# Patient Record
Sex: Female | Born: 1940
Health system: Southern US, Community
[De-identification: ages and names within clinical notes are randomized; demographics above are authoritative.]

## PROBLEM LIST (undated history)

## (undated) DIAGNOSIS — K623 Rectal prolapse: Secondary | ICD-10-CM

## (undated) DIAGNOSIS — H269 Unspecified cataract: Secondary | ICD-10-CM

## (undated) DIAGNOSIS — E785 Hyperlipidemia, unspecified: Secondary | ICD-10-CM

## (undated) DIAGNOSIS — M4802 Spinal stenosis, cervical region: Secondary | ICD-10-CM

## (undated) DIAGNOSIS — Z8742 Personal history of other diseases of the female genital tract: Secondary | ICD-10-CM

## (undated) DIAGNOSIS — IMO0002 Reserved for concepts with insufficient information to code with codable children: Secondary | ICD-10-CM

## (undated) DIAGNOSIS — Z9889 Other specified postprocedural states: Secondary | ICD-10-CM

## (undated) DIAGNOSIS — C541 Malignant neoplasm of endometrium: Secondary | ICD-10-CM

## (undated) HISTORY — DX: Hyperlipidemia, unspecified: E78.5

## (undated) HISTORY — PX: TONSILECTOMY, ADENOIDECTOMY, BILATERAL MYRINGOTOMY AND TUBES: SHX2538

## (undated) HISTORY — DX: Rectal prolapse: K62.3

## (undated) HISTORY — PX: SKIN CANCER EXCISION: SHX779

## (undated) HISTORY — PX: DILATION AND CURETTAGE OF UTERUS: SHX78

## (undated) HISTORY — DX: Malignant neoplasm of endometrium: C54.1

## (undated) HISTORY — PX: TONSILLECTOMY: SUR1361

## (undated) HISTORY — DX: Reserved for concepts with insufficient information to code with codable children: IMO0002

## (undated) HISTORY — PX: NASAL SINUS SURGERY: SHX719

## (undated) HISTORY — DX: Personal history of other diseases of the female genital tract: Z87.42

## (undated) HISTORY — PX: APPENDECTOMY: SHX54

## (undated) HISTORY — DX: Other specified postprocedural states: Z98.890

## (undated) HISTORY — PX: OTHER SURGICAL HISTORY: SHX169

## (undated) HISTORY — DX: Unspecified cataract: H26.9

## (undated) HISTORY — PX: ABDOMINAL HYSTERECTOMY: SHX81

---

## 2006-04-13 LAB — HM MAMMOGRAPHY: HM Mammogram: NORMAL

## 2006-04-13 LAB — HM PAP SMEAR: HM Pap smear: NORMAL

## 2012-12-10 DIAGNOSIS — Z9889 Other specified postprocedural states: Secondary | ICD-10-CM

## 2012-12-10 HISTORY — DX: Other specified postprocedural states: Z98.890

## 2013-03-23 ENCOUNTER — Ambulatory Visit: Payer: Self-pay | Admitting: Internal Medicine

## 2013-04-13 ENCOUNTER — Encounter: Payer: Self-pay | Admitting: Internal Medicine

## 2013-04-13 ENCOUNTER — Ambulatory Visit (INDEPENDENT_AMBULATORY_CARE_PROVIDER_SITE_OTHER): Payer: Medicare Other | Admitting: Internal Medicine

## 2013-04-13 VITALS — BP 130/90 | HR 86 | Temp 98.4°F | Ht 66.0 in | Wt 159.0 lb

## 2013-04-13 DIAGNOSIS — Z1239 Encounter for other screening for malignant neoplasm of breast: Secondary | ICD-10-CM

## 2013-04-13 DIAGNOSIS — Z1211 Encounter for screening for malignant neoplasm of colon: Secondary | ICD-10-CM | POA: Diagnosis not present

## 2013-04-13 DIAGNOSIS — Z0000 Encounter for general adult medical examination without abnormal findings: Secondary | ICD-10-CM

## 2013-04-13 NOTE — Assessment & Plan Note (Signed)
Discussed potential risks of benefits of colonoscopy. Her father died of colon cancer. She has never had colonoscopy, only sigmoidoscopy in the distant past. Given that she has no chronic medical conditions, and long life expectancy, I recommended proceeding with colonoscopy. GI referral placed.

## 2013-04-13 NOTE — Assessment & Plan Note (Signed)
Mammogram ordered

## 2013-04-13 NOTE — Progress Notes (Signed)
  Subjective:    Patient ID: Pamela Norman, female    DOB: [birth year], 72 y.o.   MRN: [id number]  HPI 72 year old female presents to establish care. She reports that she has been generally healthy throughout her life. She recently moved back to [place] after living in [place]. She has family in the area. She is not on any chronic medications. She denies any concerns about her health. She reports normal energy level and appetite. She is very active. She is overdue for mammogram and has never had a colonoscopy.  No outpatient encounter prescriptions on file as of 04/13/2013.   No facility-administered encounter medications on file as of 04/13/2013.   BP 130/90  Pulse 86  Temp(Src) 98.4 F (36.9 C)  Ht 5\' 6"  (1.676 m)  Wt 159 lb (72.122 kg)  BMI 25.68 kg/m2  SpO2 97%  Review of Systems  Constitutional: Negative for fever, chills, appetite change, fatigue and unexpected weight change.  HENT: Negative for ear pain, congestion, sore throat, trouble swallowing, neck pain, voice change and sinus pressure.   Eyes: Negative for visual disturbance.  Respiratory: Negative for cough, shortness of breath, wheezing and stridor.   Cardiovascular: Negative for chest pain, palpitations and leg swelling.  Gastrointestinal: Negative for nausea, vomiting, abdominal pain, diarrhea, constipation, blood in stool, abdominal distention and anal bleeding.  Genitourinary: Negative for dysuria and flank pain.  Musculoskeletal: Negative for myalgias, arthralgias and gait problem.  Skin: Negative for color change and rash.  Neurological: Negative for dizziness and headaches.  Hematological: Negative for adenopathy. Does not bruise/bleed easily.  Psychiatric/Behavioral: Negative for suicidal ideas, sleep disturbance and dysphoric mood. The patient is not nervous/anxious.        Objective:   Physical Exam  Constitutional: She is oriented to person, place, and time. She appears well-developed and  well-nourished. No distress.  HENT:  Head: Normocephalic and atraumatic.  Right Ear: External ear normal.  Left Ear: External ear normal.  Nose: Nose normal.  Mouth/Throat: Oropharynx is clear and moist. No oropharyngeal exudate.  Eyes: Conjunctivae are normal. Pupils are equal, round, and reactive to light. Right eye exhibits no discharge. Left eye exhibits no discharge. No scleral icterus.  Neck: Normal range of motion. Neck supple. No tracheal deviation present. No thyromegaly present.  Cardiovascular: Normal rate, regular rhythm, normal heart sounds and intact distal pulses.  Exam reveals no gallop and no friction rub.   No murmur heard. Pulmonary/Chest: Effort normal and breath sounds normal. No accessory muscle usage. Not tachypneic. No respiratory distress. She has no decreased breath sounds. She has no wheezes. She has no rales. She exhibits no tenderness.  Abdominal: Soft. Bowel sounds are normal. She exhibits no distension. There is no tenderness. There is no rebound.  Musculoskeletal: Normal range of motion. She exhibits no edema and no tenderness.  Lymphadenopathy:    She has no cervical adenopathy.  Neurological: She is alert and oriented to person, place, and time. No cranial nerve deficit. She exhibits normal muscle tone. Coordination normal.  Skin: Skin is warm and dry. No rash noted. She is not diaphoretic. No erythema. No pallor.  Psychiatric: She has a normal mood and affect. Her behavior is normal. Judgment and thought content normal.          Assessment & Plan:

## 2013-04-14 LAB — COMPREHENSIVE METABOLIC PANEL
ALT: 28 U/L (ref 0–35)
AST: 20 U/L (ref 0–37)
CO2: 27 mEq/L (ref 19–32)
Creatinine, Ser: 1.1 mg/dL (ref 0.4–1.2)
GFR: 53.7 mL/min — ABNORMAL LOW (ref >60.00)
Total Bilirubin: 0.7 mg/dL (ref 0.3–1.2)

## 2013-04-14 LAB — CBC WITH DIFFERENTIAL/PLATELET
Basophils Absolute: 0.1 10*3/uL (ref 0.0–0.1)
Basophils Relative: 0.7 % (ref 0.0–3.0)
Eosinophils Relative: 0.6 % (ref 0.0–5.0)
HCT: 43.7 % (ref 36.0–46.0)
Hemoglobin: 14.9 g/dL (ref 12.0–15.0)
Lymphs Abs: 1.7 10*3/uL (ref 0.7–4.0)
Monocytes Relative: 6.9 % (ref 3.0–12.0)
Neutro Abs: 5.2 10*3/uL (ref 1.4–7.7)
RDW: 13.6 % (ref 11.5–14.6)

## 2013-04-14 LAB — LIPID PANEL
Total CHOL/HDL Ratio: 6
VLDL: 23.4 mg/dL (ref 0.0–40.0)

## 2013-04-14 LAB — TSH: TSH: 1.43 u[IU]/mL (ref 0.35–5.50)

## 2013-04-30 DIAGNOSIS — R928 Other abnormal and inconclusive findings on diagnostic imaging of breast: Secondary | ICD-10-CM | POA: Diagnosis not present

## 2013-04-30 DIAGNOSIS — Z1231 Encounter for screening mammogram for malignant neoplasm of breast: Secondary | ICD-10-CM | POA: Diagnosis not present

## 2013-05-01 ENCOUNTER — Telehealth: Payer: Self-pay | Admitting: Internal Medicine

## 2013-05-01 NOTE — Telephone Encounter (Signed)
Mammogram 5/22 requested additional views [facility]) Has this been performed?

## 2013-05-01 NOTE — Telephone Encounter (Signed)
Yes, this has been done. She has to go in this afternoon.

## 2013-05-15 ENCOUNTER — Telehealth: Payer: Self-pay | Admitting: Internal Medicine

## 2013-05-15 NOTE — Telephone Encounter (Signed)
Mammogram from 04/30/2013 requested additional views. Has this been set up?

## 2013-05-18 NOTE — Telephone Encounter (Signed)
Spoke with patient, she stated she had it done the next day.

## 2013-05-18 NOTE — Telephone Encounter (Signed)
Left message to call back  

## 2013-05-19 ENCOUNTER — Ambulatory Visit (INDEPENDENT_AMBULATORY_CARE_PROVIDER_SITE_OTHER): Payer: Medicare Other | Admitting: Internal Medicine

## 2013-05-19 ENCOUNTER — Encounter: Payer: Self-pay | Admitting: Internal Medicine

## 2013-05-19 VITALS — BP 140/88 | HR 93 | Temp 98.7°F | Ht 66.0 in | Wt 165.0 lb

## 2013-05-19 DIAGNOSIS — Z0000 Encounter for general adult medical examination without abnormal findings: Secondary | ICD-10-CM | POA: Diagnosis not present

## 2013-05-19 NOTE — Assessment & Plan Note (Signed)
General medical exam normal today. Breast and pelvic exam deferred, as pt declined. Mammogram UTD. Immunizations UTD. Appropriate screening performed. Reviewed recent labs which showed elevated cholesterol with LDL 181. Discussed benefits and risks of statin medication. Pt prefers to work on diet and exercise regimen and repeat lipid profile in 6 months.

## 2013-05-19 NOTE — Progress Notes (Signed)
Subjective:    Patient ID: Pamela Norman, female    DOB: [birth year], 72 y.o.   MRN: [id number]  HPI The patient is here for annual Medicare wellness examination and management of other chronic and acute problems.   The risk factors are reflected in the social history.  The roster of all physicians providing medical care to patient - is listed in the Snapshot section of the chart.  Activities of daily living:  The patient is 100% independent in all ADLs: dressing, toileting, feeding as well as independent mobility  Home safety : The patient has smoke detectors in the home. They wear seatbelts.  There are no firearms at home. There is no violence in the home.   There is no risks for hepatitis, STDs or HIV. There is no history of blood transfusion. They have no travel history to infectious disease endemic areas of the world.  The patient has seen their dentist in the last six month. Dr. Fredric Mare They have not seen their eye doctor in the last year. Previously seen out of town, will follow with Dr. Dorcas Mcmurray. No issues with hearing.  They have deferred audiologic testing in the last year.  They do not  have excessive sun exposure. Discussed the need for sun protection: hats, long sleeves and use of sunscreen if there is significant sun exposure.   Diet: the importance of a healthy diet is discussed. They do have a relatively healthy diet.  The benefits of regular aerobic exercise were discussed. She plans to start working out at fitness center.  Depression screen: there are no signs or vegative symptoms of depression- irritability, change in appetite, anhedonia, sadness/tearfullness.  Cognitive assessment: the patient manages all their financial and personal affairs and is actively engaged. They could relate day,date,year and events.  The following portions of the patient's history were reviewed and updated as appropriate: allergies, current medications, past family history, past medical  history,  past surgical history, past social history  and problem list.  Visual acuity was not assessed per patient preference since she has regular follow up with her ophthalmologist. Hearing and body mass index were assessed and reviewed.   During the course of the visit the patient was educated and counseled about appropriate screening and preventive services including : fall prevention , diabetes screening, nutrition counseling, colorectal cancer screening, and recommended immunizations.     No outpatient encounter prescriptions on file as of 05/19/2013.   No facility-administered encounter medications on file as of 05/19/2013.   BP 140/88  Pulse 93  Temp(Src) 98.7 F (37.1 C) (Oral)  Ht 5\' 6"  (1.676 m)  Wt 165 lb (74.844 kg)  BMI 26.64 kg/m2  SpO2 95%  Review of Systems  Constitutional: Negative for fever, chills, appetite change, fatigue and unexpected weight change.  HENT: Negative for ear pain, congestion, sore throat, trouble swallowing, neck pain, voice change and sinus pressure.   Eyes: Negative for visual disturbance.  Respiratory: Negative for cough, shortness of breath, wheezing and stridor.   Cardiovascular: Negative for chest pain, palpitations and leg swelling.  Gastrointestinal: Negative for nausea, vomiting, abdominal pain, diarrhea, constipation, blood in stool, abdominal distention and anal bleeding.  Genitourinary: Negative for dysuria and flank pain.  Musculoskeletal: Negative for myalgias, arthralgias and gait problem.  Skin: Negative for color change and rash.  Neurological: Negative for dizziness and headaches.  Hematological: Negative for adenopathy. Does not bruise/bleed easily.  Psychiatric/Behavioral: Negative for suicidal ideas, sleep disturbance and dysphoric mood. The patient is not nervous/anxious.  Objective:   Physical Exam  Constitutional: She is oriented to person, place, and time. She appears well-developed and well-nourished. No  distress.  HENT:  Head: Normocephalic and atraumatic.  Right Ear: External ear normal.  Left Ear: External ear normal.  Nose: Nose normal.  Mouth/Throat: Oropharynx is clear and moist. No oropharyngeal exudate.  Eyes: Conjunctivae are normal. Pupils are equal, round, and reactive to light. Right eye exhibits no discharge. Left eye exhibits no discharge. No scleral icterus.  Neck: Normal range of motion. Neck supple. No tracheal deviation present. No thyromegaly present.  Cardiovascular: Normal rate, regular rhythm, normal heart sounds and intact distal pulses.  Exam reveals no gallop and no friction rub.   No murmur heard. Pulmonary/Chest: Effort normal and breath sounds normal. No accessory muscle usage. Not tachypneic. No respiratory distress. She has no decreased breath sounds. She has no wheezes. She has no rhonchi. She has no rales. She exhibits no tenderness.  Abdominal: Soft. Bowel sounds are normal. She exhibits no distension and no mass. There is no tenderness. There is no rebound and no guarding.  Musculoskeletal: Normal range of motion. She exhibits no edema and no tenderness.  Lymphadenopathy:    She has no cervical adenopathy.  Neurological: She is alert and oriented to person, place, and time. No cranial nerve deficit. She exhibits normal muscle tone. Coordination normal.  Skin: Skin is warm and dry. No rash noted. She is not diaphoretic. No erythema. No pallor.  Psychiatric: She has a normal mood and affect. Her behavior is normal. Judgment and thought content normal.          Assessment & Plan:

## 2013-05-21 LAB — HM COLONOSCOPY: HM Colonoscopy: NORMAL

## 2013-06-01 ENCOUNTER — Encounter: Payer: Self-pay | Admitting: Internal Medicine

## 2013-07-15 ENCOUNTER — Other Ambulatory Visit: Payer: Self-pay

## 2013-07-16 DIAGNOSIS — Z1211 Encounter for screening for malignant neoplasm of colon: Secondary | ICD-10-CM | POA: Diagnosis not present

## 2013-07-16 DIAGNOSIS — Q438 Other specified congenital malformations of intestine: Secondary | ICD-10-CM | POA: Diagnosis not present

## 2013-07-16 DIAGNOSIS — K5730 Diverticulosis of large intestine without perforation or abscess without bleeding: Secondary | ICD-10-CM | POA: Diagnosis not present

## 2013-07-16 DIAGNOSIS — Z800 Family history of malignant neoplasm of digestive organs: Secondary | ICD-10-CM | POA: Diagnosis not present

## 2013-07-30 ENCOUNTER — Encounter: Payer: Self-pay | Admitting: Internal Medicine

## 2013-10-15 ENCOUNTER — Other Ambulatory Visit: Payer: Self-pay

## 2013-11-18 ENCOUNTER — Ambulatory Visit: Payer: TRICARE For Life (TFL) | Admitting: Internal Medicine

## 2014-05-21 ENCOUNTER — Ambulatory Visit (INDEPENDENT_AMBULATORY_CARE_PROVIDER_SITE_OTHER): Payer: Medicare Other | Admitting: Internal Medicine

## 2014-05-21 ENCOUNTER — Encounter: Payer: Self-pay | Admitting: Internal Medicine

## 2014-05-21 VITALS — BP 132/78 | HR 77 | Temp 98.1°F | Ht 66.0 in | Wt 171.8 lb

## 2014-05-21 DIAGNOSIS — E785 Hyperlipidemia, unspecified: Secondary | ICD-10-CM | POA: Insufficient documentation

## 2014-05-21 DIAGNOSIS — R7309 Other abnormal glucose: Secondary | ICD-10-CM | POA: Diagnosis not present

## 2014-05-21 DIAGNOSIS — Z23 Encounter for immunization: Secondary | ICD-10-CM

## 2014-05-21 DIAGNOSIS — R739 Hyperglycemia, unspecified: Secondary | ICD-10-CM

## 2014-05-21 LAB — HEMOGLOBIN A1C: Hgb A1c MFr Bld: 6 % (ref 4.6–6.5)

## 2014-05-21 LAB — COMPREHENSIVE METABOLIC PANEL
ALBUMIN: 4 g/dL (ref 3.5–5.2)
ALK PHOS: 53 U/L (ref 39–117)
ALT: 18 U/L (ref 0–35)
AST: 18 U/L (ref 0–37)
BUN: 19 mg/dL (ref 6–23)
CO2: 29 mEq/L (ref 19–32)
Calcium: 9.3 mg/dL (ref 8.4–10.5)
Chloride: 104 mEq/L (ref 96–112)
Creatinine, Ser: 0.8 mg/dL (ref 0.4–1.2)
GFR: 79.44 mL/min (ref >60.00)
GLUCOSE: 126 mg/dL — AB (ref 70–99)
POTASSIUM: 4 meq/L (ref 3.5–5.1)
SODIUM: 140 meq/L (ref 135–145)
TOTAL PROTEIN: 6.9 g/dL (ref 6.0–8.3)
Total Bilirubin: 0.6 mg/dL (ref 0.2–1.2)

## 2014-05-21 LAB — LIPID PANEL
CHOL/HDL RATIO: 6
CHOLESTEROL: 230 mg/dL — AB (ref 0–200)
HDL: 35.5 mg/dL — ABNORMAL LOW (ref >39.00)
LDL CALC: 154 mg/dL — AB (ref 0–99)
NonHDL: 194.5
Triglycerides: 201 mg/dL — ABNORMAL HIGH (ref 0.0–149.0)
VLDL: 40.2 mg/dL — ABNORMAL HIGH (ref 0.0–40.0)

## 2014-05-21 NOTE — Assessment & Plan Note (Signed)
BG noted to be elevated on previous labs. Will check A1c with labs today. Encouraged healthy diet and regular exercise.

## 2014-05-21 NOTE — Progress Notes (Signed)
Pre visit review using our clinic review tool, if applicable. No additional management support is needed unless otherwise documented below in the visit note. 

## 2014-05-21 NOTE — Addendum Note (Signed)
Addended by: Nanci Pina on: 05/21/2014 03:10 PM   Modules accepted: Orders

## 2014-05-21 NOTE — Progress Notes (Signed)
Subjective:    Patient ID: Pamela Norman, female    DOB: Feb 06, 1941, 73 y.o.   MRN: [id number]  HPI 73YO female presents for follow up. Feeling well. Continues to be very active, playing basketball and golf. Tries to follow a healthy diet. Was noted to have elevated lipids and glucose on previous labs.   Review of Systems  Constitutional: Negative for fever, chills, appetite change, fatigue and unexpected weight change.  HENT: Negative for congestion, ear pain, sinus pressure, sore throat, trouble swallowing and voice change.   Eyes: Negative for visual disturbance.  Respiratory: Negative for cough, shortness of breath, wheezing and stridor.   Cardiovascular: Negative for chest pain, palpitations and leg swelling.  Gastrointestinal: Negative for nausea, vomiting, abdominal pain, diarrhea, constipation, blood in stool, abdominal distention and anal bleeding.  Genitourinary: Negative for dysuria and flank pain.  Musculoskeletal: Negative for arthralgias, gait problem, myalgias and neck pain.  Skin: Negative for color change and rash.  Neurological: Negative for dizziness and headaches.  Hematological: Negative for adenopathy. Does not bruise/bleed easily.  Psychiatric/Behavioral: Negative for suicidal ideas, sleep disturbance and dysphoric mood. The patient is not nervous/anxious.        Objective:    BP 132/78  Pulse 77  Temp(Src) 98.1 F (36.7 C) (Oral)  Ht 5\' 6"  (1.676 m)  Wt 171 lb 12 oz (77.905 kg)  BMI 27.73 kg/m2  SpO2 96% Physical Exam  Constitutional: She is oriented to person, place, and time. She appears well-developed and well-nourished. No distress.  HENT:  Head: Normocephalic and atraumatic.  Right Ear: External ear normal.  Left Ear: External ear normal.  Nose: Nose normal.  Mouth/Throat: Oropharynx is clear and moist. No oropharyngeal exudate.  Eyes: Conjunctivae are normal. Pupils are equal, round, and reactive to light. Right eye exhibits no discharge. Left  eye exhibits no discharge. No scleral icterus.  Neck: Normal range of motion. Neck supple. No tracheal deviation present. No thyromegaly present.  Cardiovascular: Normal rate, regular rhythm, normal heart sounds and intact distal pulses.  Exam reveals no gallop and no friction rub.   No murmur heard. Pulmonary/Chest: Effort normal and breath sounds normal. No accessory muscle usage. Not tachypneic. No respiratory distress. She has no decreased breath sounds. She has no wheezes. She has no rhonchi. She has no rales. She exhibits no tenderness.  Musculoskeletal: Normal range of motion. She exhibits no edema and no tenderness.  Lymphadenopathy:    She has no cervical adenopathy.  Neurological: She is alert and oriented to person, place, and time. No cranial nerve deficit. She exhibits normal muscle tone. Coordination normal.  Skin: Skin is warm and dry. No rash noted. She is not diaphoretic. No erythema. No pallor.  Psychiatric: She has a normal mood and affect. Her behavior is normal. Judgment and thought content normal.          Assessment & Plan:   Problem List Items Addressed This Visit     Unprioritized   Elevated blood sugar     BG noted to be elevated on previous labs. Will check A1c with labs today. Encouraged healthy diet and regular exercise.    Relevant Orders      Comprehensive metabolic panel      Hemoglobin A1c   Other and unspecified hyperlipidemia - Primary     Will check lipids and LFTs with labs today.    Relevant Orders      Lipid panel       Return in about 6 months (around  11/20/2014) for Wellness Visit.

## 2014-05-21 NOTE — Assessment & Plan Note (Signed)
Will check lipids and LFTs with labs today. 

## 2014-11-11 DIAGNOSIS — H2513 Age-related nuclear cataract, bilateral: Secondary | ICD-10-CM | POA: Diagnosis not present

## 2014-11-18 ENCOUNTER — Encounter: Payer: Medicare Other | Admitting: Internal Medicine

## 2014-11-18 ENCOUNTER — Ambulatory Visit: Payer: Medicare Other

## 2014-12-10 DIAGNOSIS — C541 Malignant neoplasm of endometrium: Secondary | ICD-10-CM

## 2014-12-10 HISTORY — DX: Malignant neoplasm of endometrium: C54.1

## 2014-12-10 HISTORY — PX: COMBINED HYSTEROSCOPY DIAGNOSTIC / D&C: SUR297

## 2014-12-30 ENCOUNTER — Encounter: Payer: Medicare Other | Admitting: Internal Medicine

## 2015-01-03 ENCOUNTER — Emergency Department: Payer: Self-pay | Admitting: Emergency Medicine

## 2015-01-03 DIAGNOSIS — N390 Urinary tract infection, site not specified: Secondary | ICD-10-CM | POA: Diagnosis not present

## 2015-01-03 DIAGNOSIS — N3000 Acute cystitis without hematuria: Secondary | ICD-10-CM | POA: Diagnosis not present

## 2015-01-03 DIAGNOSIS — R4182 Altered mental status, unspecified: Secondary | ICD-10-CM | POA: Diagnosis not present

## 2015-01-03 DIAGNOSIS — R9431 Abnormal electrocardiogram [ECG] [EKG]: Secondary | ICD-10-CM | POA: Diagnosis not present

## 2015-01-03 DIAGNOSIS — F29 Unspecified psychosis not due to a substance or known physiological condition: Secondary | ICD-10-CM | POA: Diagnosis not present

## 2015-01-03 DIAGNOSIS — F23 Brief psychotic disorder: Secondary | ICD-10-CM | POA: Diagnosis not present

## 2015-01-03 DIAGNOSIS — N3090 Cystitis, unspecified without hematuria: Secondary | ICD-10-CM | POA: Diagnosis not present

## 2015-01-03 LAB — CBC
HCT: 42.3 % (ref 35.0–47.0)
HGB: 14.1 g/dL (ref 12.0–16.0)
MCH: 31 pg (ref 26.0–34.0)
MCHC: 33.4 g/dL (ref 32.0–36.0)
MCV: 93 fL (ref 80–100)
Platelet: 222 10*3/uL (ref 150–440)
RBC: 4.55 10*6/uL (ref 3.80–5.20)
RDW: 13.4 % (ref 11.5–14.5)
WBC: 6.9 10*3/uL (ref 3.6–11.0)

## 2015-01-03 LAB — URINALYSIS, COMPLETE
BILIRUBIN, UR: NEGATIVE
GLUCOSE, UR: NEGATIVE mg/dL (ref 0–75)
Ketone: NEGATIVE
Nitrite: NEGATIVE
Ph: 6 (ref 4.5–8.0)
Protein: NEGATIVE
RBC,UR: 3 /HPF (ref 0–5)
SPECIFIC GRAVITY: 1.008 (ref 1.003–1.030)
WBC UR: 21 /HPF (ref 0–5)

## 2015-01-03 LAB — COMPREHENSIVE METABOLIC PANEL
ALT: 26 U/L
Albumin: 3.8 g/dL (ref 3.4–5.0)
Alkaline Phosphatase: 67 U/L
Anion Gap: 6 — ABNORMAL LOW (ref 7–16)
BILIRUBIN TOTAL: 0.4 mg/dL (ref 0.2–1.0)
BUN: 17 mg/dL (ref 7–18)
CALCIUM: 8.9 mg/dL (ref 8.5–10.1)
Chloride: 107 mmol/L (ref 98–107)
Co2: 30 mmol/L (ref 21–32)
Creatinine: 0.89 mg/dL (ref 0.60–1.30)
EGFR (African American): >60
EGFR (Non-African Amer.): >60
GLUCOSE: 135 mg/dL — AB (ref 65–99)
Osmolality: 289 (ref 275–301)
Potassium: 3.5 mmol/L (ref 3.5–5.1)
SGOT(AST): 19 U/L (ref 15–37)
Sodium: 143 mmol/L (ref 136–145)
TOTAL PROTEIN: 7.3 g/dL (ref 6.4–8.2)

## 2015-01-03 LAB — TROPONIN I: Troponin-I: <0.02 ng/mL

## 2015-01-04 DIAGNOSIS — F05 Delirium due to known physiological condition: Secondary | ICD-10-CM | POA: Diagnosis not present

## 2015-01-05 ENCOUNTER — Telehealth: Payer: Self-pay | Admitting: Internal Medicine

## 2015-01-05 NOTE — Telephone Encounter (Signed)
Pt called states she already spoke with [facility] and was advised they were at lunch.  However, pt has been having vaginal bleeding for several days/weeks.  Pt was advised to be seen at ER or Urgent Care.  Pt refused.  She states it wasn't urgent, further states she only wants to see Dr Gilford Rile.  I advised her that she would be transferred to [facility] for scheduling however she needs to be seen sooner than later.  Pt refused to be seen at Urgent Care or ER.

## 2015-01-05 NOTE — Telephone Encounter (Signed)
If this pt has vaginal bleeding, she will need to be seen by gynecology, as she may need an endometrial biopsy. We can set up referral if she would like, or see her first in the office.

## 2015-01-05 NOTE — Telephone Encounter (Signed)
Pt needs to be seen for vaginal bleeding at 74 years old.

## 2015-01-05 NOTE — Telephone Encounter (Signed)
The patient was forwarded to [facility] due to her symptoms of vaginal bleeding . The patient stated that they informed her that the staff was at lunch and someone would call her back. Please advise.

## 2015-01-05 NOTE — Telephone Encounter (Signed)
Patient has been scheduled with [facility] on 1.28.16.

## 2015-01-06 ENCOUNTER — Ambulatory Visit (INDEPENDENT_AMBULATORY_CARE_PROVIDER_SITE_OTHER): Payer: Medicare Other | Admitting: Nurse Practitioner

## 2015-01-06 ENCOUNTER — Encounter: Payer: Self-pay | Admitting: Nurse Practitioner

## 2015-01-06 VITALS — BP 126/70 | HR 92 | Temp 97.4°F | Resp 12 | Ht 66.0 in | Wt 171.8 lb

## 2015-01-06 DIAGNOSIS — N939 Abnormal uterine and vaginal bleeding, unspecified: Secondary | ICD-10-CM | POA: Diagnosis not present

## 2015-01-06 NOTE — Telephone Encounter (Signed)
Pt has appoint today with [facility].

## 2015-01-06 NOTE — Progress Notes (Signed)
   Subjective:    Patient ID: Pamela Norman, female    DOB: 01-11-1941, 74 y.o.   MRN: [id number]  HPI  Ms. Branagan is a 74 yo female here for a request for a GYN referral. She is accompanied by a friend who is the primary historian.   1) Patient reports that she "slept wrong" and things are "twisted". She pointed to her neck.   First of the week went to [facility]- EMS had vaginal bleeding. Cipro was given for a UTI.   Pt is uncomfortable talking about the issue openly and she just says repeatedly that "things are off". She states they did not do a pelvic at [facility]. She would prefer to be seen by GYN and would like a referral.    Review of Systems  Constitutional: Negative for fever, chills, diaphoresis and fatigue.  Genitourinary: Positive for hematuria and vaginal bleeding. Negative for dysuria, urgency, frequency, flank pain, decreased urine volume, vaginal discharge, vaginal pain, menstrual problem, pelvic pain and dyspareunia.  Musculoskeletal: Positive for arthralgias.       Pt did not state her spine was hurting, but indicated her neck was "twisted"  Skin: Negative for rash.      Objective:   Physical Exam  Constitutional: She is oriented to person, place, and time. She appears well-developed and well-nourished. No distress.  BP 126/70 mmHg  Pulse 92  Temp(Src) 97.4 F (36.3 C) (Oral)  Resp 12  Ht 5\' 6"  (1.676 m)  Wt 171 lb 12.8 oz (77.928 kg)  BMI 27.74 kg/m2  SpO2 98%   HENT:  Head: Normocephalic and atraumatic.  Right Ear: External ear normal.  Left Ear: External ear normal.  Cardiovascular: Normal rate, regular rhythm, normal heart sounds and intact distal pulses.  Exam reveals no gallop and no friction rub.   No murmur heard. Pulmonary/Chest: Effort normal and breath sounds normal. No respiratory distress. She has no wheezes. She has no rales. She exhibits no tenderness.  Genitourinary:  Pt refused pelvic exam. Would prefer referral.  Neurological: She is alert and  oriented to person, place, and time. No cranial nerve deficit. She exhibits normal muscle tone. Coordination normal.  Skin: Skin is warm and dry. No rash noted. She is not diaphoretic.  Psychiatric: She has a normal mood and affect. Her behavior is normal. Judgment and thought content normal.      Assessment & Plan:

## 2015-01-06 NOTE — Progress Notes (Signed)
Pre visit review using our clinic review tool, if applicable. No additional management support is needed unless otherwise documented below in the visit note. 

## 2015-01-06 NOTE — Patient Instructions (Addendum)
We will contact you about your referral to Gynecology.

## 2015-01-07 ENCOUNTER — Emergency Department (HOSPITAL_COMMUNITY)
Admission: EM | Admit: 2015-01-07 | Discharge: 2015-01-07 | Disposition: A | Discharge status: Home / Self Care | Payer: Medicare Other | Attending: Emergency Medicine | Admitting: Emergency Medicine

## 2015-01-07 ENCOUNTER — Encounter (HOSPITAL_COMMUNITY): Payer: Self-pay | Admitting: Physical Medicine and Rehabilitation

## 2015-01-07 ENCOUNTER — Telehealth: Payer: Self-pay | Admitting: Internal Medicine

## 2015-01-07 DIAGNOSIS — Z85828 Personal history of other malignant neoplasm of skin: Secondary | ICD-10-CM | POA: Diagnosis not present

## 2015-01-07 DIAGNOSIS — Z8639 Personal history of other endocrine, nutritional and metabolic disease: Secondary | ICD-10-CM | POA: Diagnosis not present

## 2015-01-07 DIAGNOSIS — N939 Abnormal uterine and vaginal bleeding, unspecified: Secondary | ICD-10-CM | POA: Insufficient documentation

## 2015-01-07 DIAGNOSIS — R011 Cardiac murmur, unspecified: Secondary | ICD-10-CM | POA: Insufficient documentation

## 2015-01-07 DIAGNOSIS — H269 Unspecified cataract: Secondary | ICD-10-CM | POA: Insufficient documentation

## 2015-01-07 DIAGNOSIS — R52 Pain, unspecified: Secondary | ICD-10-CM | POA: Diagnosis not present

## 2015-01-07 DIAGNOSIS — R51 Headache: Secondary | ICD-10-CM | POA: Insufficient documentation

## 2015-01-07 DIAGNOSIS — F419 Anxiety disorder, unspecified: Secondary | ICD-10-CM | POA: Diagnosis not present

## 2015-01-07 DIAGNOSIS — Z87891 Personal history of nicotine dependence: Secondary | ICD-10-CM | POA: Diagnosis not present

## 2015-01-07 LAB — CBC WITH DIFFERENTIAL/PLATELET
BASOS ABS: 0 10*3/uL (ref 0.0–0.1)
Basophils Relative: 0 % (ref 0–1)
EOS PCT: 1 % (ref 0–5)
Eosinophils Absolute: 0.1 10*3/uL (ref 0.0–0.7)
HCT: 41.6 % (ref 36.0–46.0)
Hemoglobin: 13.8 g/dL (ref 12.0–15.0)
Lymphocytes Relative: 18 % (ref 12–46)
Lymphs Abs: 1.5 10*3/uL (ref 0.7–4.0)
MCH: 30.7 pg (ref 26.0–34.0)
MCHC: 33.2 g/dL (ref 30.0–36.0)
MCV: 92.7 fL (ref 78.0–100.0)
MONOS PCT: 7 % (ref 3–12)
Monocytes Absolute: 0.6 10*3/uL (ref 0.1–1.0)
NEUTROS ABS: 6.2 10*3/uL (ref 1.7–7.7)
Neutrophils Relative %: 74 % (ref 43–77)
PLATELETS: 247 10*3/uL (ref 150–400)
RBC: 4.49 MIL/uL (ref 3.87–5.11)
RDW: 13.5 % (ref 11.5–15.5)
WBC: 8.4 10*3/uL (ref 4.0–10.5)

## 2015-01-07 LAB — COMPREHENSIVE METABOLIC PANEL
ALT: 25 U/L (ref 0–35)
AST: 27 U/L (ref 0–37)
Albumin: 4.2 g/dL (ref 3.5–5.2)
Alkaline Phosphatase: 62 U/L (ref 39–117)
Anion gap: 11 (ref 5–15)
BUN: 19 mg/dL (ref 6–23)
CO2: 26 mmol/L (ref 19–32)
Calcium: 9.1 mg/dL (ref 8.4–10.5)
Chloride: 104 mmol/L (ref 96–112)
Creatinine, Ser: 0.81 mg/dL (ref 0.50–1.10)
GFR calc Af Amer: 82 mL/min — ABNORMAL LOW (ref >90)
GFR, EST NON AFRICAN AMERICAN: 71 mL/min — AB (ref >90)
Glucose, Bld: 175 mg/dL — ABNORMAL HIGH (ref 70–99)
POTASSIUM: 4 mmol/L (ref 3.5–5.1)
Sodium: 141 mmol/L (ref 135–145)
Total Bilirubin: 0.7 mg/dL (ref 0.3–1.2)
Total Protein: 7.1 g/dL (ref 6.0–8.3)

## 2015-01-07 MED ORDER — DIAZEPAM 5 MG PO TABS: 5.0000 mg | ORAL_TABLET | Freq: Three times a day (TID) | ORAL | Status: DC | PRN

## 2015-01-07 MED ORDER — DIAZEPAM 5 MG PO TABS
5.0000 mg | ORAL_TABLET | Freq: Once | ORAL | Status: AC
  Administered 2015-01-07: 5 mg via ORAL
  Filled 2015-01-07: qty 1

## 2015-01-07 NOTE — Telephone Encounter (Signed)
I personally called pt and she answered and stated that "from my hair to my spine something has crossed over, I am not confused I know what I am feeling". She sounded concerned and stated her friend will be taking her to [facility] Emergency room. We will follow. I encouraged her to follow through on this recommendation.

## 2015-01-07 NOTE — ED Provider Notes (Signed)
CSN: [id number]     Arrival date & time 01/07/15  1702 History   First MD Initiated Contact with Patient 01/07/15 2007     Chief Complaint  Patient presents with  . Pain     (Consider location/radiation/quality/duration/timing/severity/associated sxs/prior Treatment) The history is provided by the patient. No language interpreter was used.   Patient is a 74 year old Caucasian female with no pertinent past medical history who comes to the emergency department today concerned that her body is not aligned appropriately. Patient reports that over the past week she has had difficulty with having her body align appropriately and it has caused her difficulty with walking. She has not had any falls or injuries. Patient denies any weakness or numbness. She states that the sensation body causes her so much distress that she is having some pain. She initially went to [facility] approximately 3 days ago for the same complaint. She states that she was admitted overnight and treated for a urinary tract infection. However the patient feels that she was not taken seriously as a result she comes back today. Patient also reports some vaginal bleeding which has been going on for approximately the same amount of time. She is scheduled to see a gynecologist on Thursday. Patient denies any headaches.  Past Medical History  Diagnosis Date  . Heart murmur   . Hyperlipidemia   . Squamous cell carcinoma     face  . Cataract    Past Surgical History  Procedure Laterality Date  . Appendectomy    . Tonsilectomy, adenoidectomy, bilateral myringotomy and tubes     Family History  Problem Relation Age of Onset  . Cancer Father     colon  . COPD Father   . COPD Mother    History  Substance Use Topics  . Smoking status: Former Smoker    Quit date: 04/13/1981  . Smokeless tobacco: Never Used  . Alcohol Use: 1.2 oz/week    2 Glasses of wine per week   OB History    No data available      Review of Systems  Constitutional: Negative for fever and chills.  Respiratory: Negative for cough and shortness of breath.   Cardiovascular: Negative for chest pain.  Gastrointestinal: Negative for nausea, vomiting and abdominal pain.  Genitourinary: Negative for dysuria, urgency and frequency.  Musculoskeletal: Negative for myalgias, back pain, gait problem and neck pain.  Psychiatric/Behavioral: The patient is nervous/anxious.   All other systems reviewed and are negative.     Allergies  Review of patient's allergies indicates no known allergies.  Home Medications   Prior to Admission medications   Medication Sig Start Date End Date Taking? Authorizing Provider  diazepam (VALIUM) 5 MG tablet Take 1 tablet (5 mg total) by mouth every 8 (eight) hours as needed for anxiety or muscle spasms. 01/07/15   Katheren Shams, MD   BP 122/70 mmHg  Pulse 65  Temp(Src) 97.5 F (36.4 C) (Oral)  Resp 18  SpO2 99% Physical Exam  Constitutional: She is oriented to person, place, and time. She appears well-developed and well-nourished. No distress.  HENT:  Head: Normocephalic and atraumatic.  Eyes: Pupils are equal, round, and reactive to light.  Neck: Normal range of motion.  Cardiovascular: Normal rate, regular rhythm, normal heart sounds and intact distal pulses.   Pulmonary/Chest: Effort normal. No respiratory distress. She has no wheezes. She exhibits no tenderness.  Abdominal: Soft. Bowel sounds are normal. She exhibits no distension. There is no tenderness. There  is no rebound and no guarding.  Neurological: She is alert and oriented to person, place, and time. She has normal strength. No cranial nerve deficit or sensory deficit. She exhibits normal muscle tone. Coordination and gait normal.  Strength 5/5 bilateral upper and lower extremities.  Sensation intact x4 extremities.  CN II-XII intact.  No gait abnormalities.    Skin: Skin is warm and dry.  Nursing note and vitals  reviewed.   ED Course  Procedures (including critical care time) Labs Review Labs Reviewed  COMPREHENSIVE METABOLIC PANEL - Abnormal; Notable for the following:    Glucose, Bld 175 (*)    GFR calc non Af Amer 71 (*)    GFR calc Af Amer 82 (*)    All other components within normal limits  CBC WITH DIFFERENTIAL/PLATELET    Imaging Review No results found.   EKG Interpretation None      MDM   Final diagnoses:  Generalized pain    Patient is 74 year old Caucasian female with no pertinent past medical history who comes to the emergency department today with difficulty aligning her body. Physical exam as above. Initial workup included a CBC and a CMP. These were unremarkable. Patient has a nonfocal neurologic exam with no weakness or numbness as result I doubt a CVA or spinal cord injury. Patient has not had any manipulation of her spine or injuries to her spine further supporting that this has not occurred. Patient was able to ambulate in the emergency department with no difficulty and no ataxia is result doubt a posterior CVA.  Patient is very anxious on exam and is concerned there is something wrong with her and that her muscles are spasming. As result patient was treated with Valium to determine if this would improve her muscle spasm and anxiety. Patient was provided with a prescription for the same. The patient has been having some vaginal bleeding and has a distended abdomen concerning for potential uterine cancer. The patient has a follow-up appointment with a gynecologist for next week she was instructed to maintain this follow-up appointment and to return to the emergency department with focal weakness numbness or any other concerns. The patient expressed understanding. She was discharged in good condition. Labs reviewed by myself and considered in medical decision making. Care was discussed with my attending Dr. Audie Pinto.     Katheren Shams, MD 01/08/15 [place],  MD 01/09/15 [id number]

## 2015-01-07 NOTE — ED Notes (Signed)
Pt. Went to restroom to give urine sample, however patient left restroom with hat still in place and someone else used restroom as well so urine sample was thrown out due to contamination.

## 2015-01-07 NOTE — ED Notes (Signed)
Pt states she feels pain all over body. States her body feels like it is "twisted." also reports "choking" sensation to neck. Family member states she has issues with anxiety. Pt is alert and oriented x4.

## 2015-01-07 NOTE — ED Notes (Signed)
Called for triage no answer  

## 2015-01-07 NOTE — Assessment & Plan Note (Signed)
Stable. Pt is post follow up from ER at [facility]. She states she is having " a few drops" of blood and her friend stated it was more. Unsure of issue due to pt being a poor historian. Referred to GYN. FU prn.

## 2015-01-07 NOTE — ED Notes (Signed)
Pt called,no answer.

## 2015-01-07 NOTE — ED Notes (Signed)
Pt and family member had went to the cafeteria to get them something to eat

## 2015-01-07 NOTE — ED Notes (Signed)
Pt. Reports "nerve pain" that starts in her neck and radiates down spine, bilateral arms, and bilateral legs. Denies numbness/tingling. States "I'm just twisted and uneven, and I can't get straight, and my gait is funny". Pt. Ambulatory with no problems. Alert and oriented x4. Seen at [facility] for same.

## 2015-01-07 NOTE — Discharge Instructions (Signed)
Pain of Unknown Etiology (Pain Without a Known Cause) °You have come to your caregiver because of pain. Pain can occur in any part of the body. Often there is not a definite cause. If your laboratory (blood or urine) work was normal and X-rays or other studies were normal, your caregiver may treat you without knowing the cause of the pain. An example of this is the headache. Most headaches are diagnosed by taking a history. This means your caregiver asks you questions about your headaches. Your caregiver determines a treatment based on your answers. Usually testing done for headaches is normal. Often testing is not done unless there is no response to medications. Regardless of where your pain is located today, you can be given medications to make you comfortable. If no physical cause of pain can be found, most cases of pain will gradually leave as suddenly as they came.  °If you have a painful condition and no reason can be found for the pain, it is important that you follow up with your caregiver. If the pain becomes worse or does not go away, it may be necessary to repeat tests and look further for a possible cause. °· Only take over-the-counter or prescription medicines for pain, discomfort, or fever as directed by your caregiver. °· For the protection of your privacy, test results cannot be given over the phone. Make sure you receive the results of your test. Ask how these results are to be obtained if you have not been informed. It is your responsibility to obtain your test results. °· You may continue all activities unless the activities cause more pain. When the pain lessens, it is important to gradually resume normal activities. Resume activities by beginning slowly and gradually increasing the intensity and duration of the activities or exercise. During periods of severe pain, bed rest may be helpful. Lie or sit in any position that is comfortable. °· Ice used for acute (sudden) conditions may be effective.  Use a large plastic bag filled with ice and wrapped in a towel. This may provide pain relief. °· See your caregiver for continued problems. Your caregiver can help or refer you for exercises or physical therapy if necessary. °If you were given medications for your condition, do not drive, operate machinery or power tools, or sign legal documents for 24 hours. Do not drink alcohol, take sleeping pills, or take other medications that may interfere with treatment. °See your caregiver immediately if you have pain that is becoming worse and not relieved by medications. °Document Released: 08/21/2001 Document Revised: 09/16/2013 Document Reviewed: 11/26/2005 °ExitCare® Patient Information ©2015 [employer]. This information is not intended to replace advice given to you by your health care provider. Make sure you discuss any questions you have with your health care provider. ° °

## 2015-01-07 NOTE — ED Notes (Signed)
Pt called in main ED waiting area with no response; Hayley, RN aware

## 2015-01-07 NOTE — Telephone Encounter (Signed)
[facility]     Patient Name: Pamela Norman Initial Comment Caller states her friend has vaginal bleeding post menopause, left side of body numb, feels "twisting" pains  DOB: 1940-12-17      Nurse Assessment  Nurse: Venetia Maxon, RN, Manuela Schwartz Date/Time (Eastern Time): 01/07/2015 2:50:45 PM  Confirm and document reason for call. If symptomatic, describe symptoms. ---Caller states her friend has vaginal bleeding post menopause over the past years , left side of body numb, feels "twisting" pains.  Has the patient traveled out of the country within the last 30 days? ---No  Does the patient require triage? ---Yes  Related visit to physician within the last 2 weeks? ---No  Does the PT have any chronic conditions? (i.e. diabetes, asthma, etc.) ---Yes  List chronic conditions. ---cannot recall.    Guidelines     Guideline Title Affirmed Question Affirmed Notes   Neurologic Deficit [1] Numbness (i.e., loss of sensation) of the face, arm or leg on one side of the body AND [2] sudden onset AND [3] present now    Final Disposition User   Call EMS 911 Now Venetia Maxon, RN, Manuela Schwartz   Comments   Caller disconnected the call. Will try to reach the caller.

## 2015-01-08 LAB — URINE CULTURE

## 2015-01-10 DIAGNOSIS — Z8742 Personal history of other diseases of the female genital tract: Secondary | ICD-10-CM

## 2015-01-10 HISTORY — DX: Personal history of other diseases of the female genital tract: Z87.42

## 2015-01-13 DIAGNOSIS — N842 Polyp of vagina: Secondary | ICD-10-CM | POA: Diagnosis not present

## 2015-01-13 DIAGNOSIS — K623 Rectal prolapse: Secondary | ICD-10-CM | POA: Diagnosis not present

## 2015-01-13 DIAGNOSIS — N950 Postmenopausal bleeding: Secondary | ICD-10-CM | POA: Diagnosis not present

## 2015-01-18 DIAGNOSIS — R938 Abnormal findings on diagnostic imaging of other specified body structures: Secondary | ICD-10-CM | POA: Diagnosis not present

## 2015-01-18 DIAGNOSIS — N950 Postmenopausal bleeding: Secondary | ICD-10-CM | POA: Diagnosis not present

## 2015-01-18 DIAGNOSIS — D259 Leiomyoma of uterus, unspecified: Secondary | ICD-10-CM | POA: Diagnosis not present

## 2015-01-18 DIAGNOSIS — N8320 Unspecified ovarian cysts: Secondary | ICD-10-CM | POA: Diagnosis not present

## 2015-01-18 DIAGNOSIS — K623 Rectal prolapse: Secondary | ICD-10-CM | POA: Diagnosis not present

## 2015-01-20 DIAGNOSIS — K623 Rectal prolapse: Secondary | ICD-10-CM | POA: Diagnosis not present

## 2015-02-03 ENCOUNTER — Ambulatory Visit: Payer: Self-pay | Admitting: Obstetrics and Gynecology

## 2015-02-03 DIAGNOSIS — N842 Polyp of vagina: Secondary | ICD-10-CM | POA: Diagnosis not present

## 2015-02-03 DIAGNOSIS — Z01812 Encounter for preprocedural laboratory examination: Secondary | ICD-10-CM | POA: Diagnosis not present

## 2015-02-03 DIAGNOSIS — R011 Cardiac murmur, unspecified: Secondary | ICD-10-CM | POA: Diagnosis not present

## 2015-02-03 DIAGNOSIS — Z79899 Other long term (current) drug therapy: Secondary | ICD-10-CM | POA: Diagnosis not present

## 2015-02-03 DIAGNOSIS — D259 Leiomyoma of uterus, unspecified: Secondary | ICD-10-CM | POA: Diagnosis not present

## 2015-02-03 DIAGNOSIS — C541 Malignant neoplasm of endometrium: Secondary | ICD-10-CM | POA: Diagnosis not present

## 2015-02-03 DIAGNOSIS — Z01818 Encounter for other preprocedural examination: Secondary | ICD-10-CM | POA: Diagnosis not present

## 2015-02-03 DIAGNOSIS — N950 Postmenopausal bleeding: Secondary | ICD-10-CM | POA: Diagnosis not present

## 2015-02-03 DIAGNOSIS — Z01810 Encounter for preprocedural cardiovascular examination: Secondary | ICD-10-CM | POA: Diagnosis not present

## 2015-02-03 DIAGNOSIS — Z85828 Personal history of other malignant neoplasm of skin: Secondary | ICD-10-CM | POA: Diagnosis not present

## 2015-02-07 ENCOUNTER — Ambulatory Visit: Payer: Self-pay | Admitting: Obstetrics and Gynecology

## 2015-02-07 DIAGNOSIS — C541 Malignant neoplasm of endometrium: Secondary | ICD-10-CM | POA: Diagnosis not present

## 2015-02-07 DIAGNOSIS — N950 Postmenopausal bleeding: Secondary | ICD-10-CM | POA: Diagnosis not present

## 2015-02-07 DIAGNOSIS — Z85828 Personal history of other malignant neoplasm of skin: Secondary | ICD-10-CM | POA: Diagnosis not present

## 2015-02-07 DIAGNOSIS — N840 Polyp of corpus uteri: Secondary | ICD-10-CM | POA: Diagnosis not present

## 2015-02-07 DIAGNOSIS — C55 Malignant neoplasm of uterus, part unspecified: Secondary | ICD-10-CM | POA: Diagnosis not present

## 2015-02-07 DIAGNOSIS — Z9889 Other specified postprocedural states: Secondary | ICD-10-CM | POA: Diagnosis not present

## 2015-02-07 DIAGNOSIS — Z79899 Other long term (current) drug therapy: Secondary | ICD-10-CM | POA: Diagnosis not present

## 2015-02-07 DIAGNOSIS — Z800 Family history of malignant neoplasm of digestive organs: Secondary | ICD-10-CM | POA: Diagnosis not present

## 2015-02-07 DIAGNOSIS — Z87891 Personal history of nicotine dependence: Secondary | ICD-10-CM | POA: Diagnosis not present

## 2015-02-09 DIAGNOSIS — N950 Postmenopausal bleeding: Secondary | ICD-10-CM | POA: Diagnosis not present

## 2015-02-09 DIAGNOSIS — C541 Malignant neoplasm of endometrium: Secondary | ICD-10-CM | POA: Diagnosis not present

## 2015-02-23 ENCOUNTER — Ambulatory Visit
Admit: 2015-02-23 | Disposition: A | Discharge status: Home / Self Care | Payer: Self-pay | Attending: Obstetrics and Gynecology | Admitting: Obstetrics and Gynecology

## 2015-02-23 DIAGNOSIS — C7982 Secondary malignant neoplasm of genital organs: Secondary | ICD-10-CM | POA: Diagnosis not present

## 2015-02-23 DIAGNOSIS — C541 Malignant neoplasm of endometrium: Secondary | ICD-10-CM | POA: Diagnosis not present

## 2015-02-23 DIAGNOSIS — F419 Anxiety disorder, unspecified: Secondary | ICD-10-CM | POA: Diagnosis not present

## 2015-02-23 DIAGNOSIS — N8320 Unspecified ovarian cysts: Secondary | ICD-10-CM | POA: Diagnosis not present

## 2015-03-11 ENCOUNTER — Ambulatory Visit
Admit: 2015-03-11 | Disposition: A | Discharge status: Home / Self Care | Payer: Self-pay | Attending: Obstetrics and Gynecology | Admitting: Obstetrics and Gynecology

## 2015-03-21 ENCOUNTER — Ambulatory Visit
Admit: 2015-03-21 | Disposition: A | Discharge status: Home / Self Care | Payer: Self-pay | Attending: Obstetrics and Gynecology | Admitting: Obstetrics and Gynecology

## 2015-03-21 DIAGNOSIS — C541 Malignant neoplasm of endometrium: Secondary | ICD-10-CM | POA: Diagnosis not present

## 2015-03-21 DIAGNOSIS — Z01812 Encounter for preprocedural laboratory examination: Secondary | ICD-10-CM | POA: Diagnosis not present

## 2015-03-30 ENCOUNTER — Ambulatory Visit
Admit: 2015-03-30 | Disposition: A | Discharge status: Home / Self Care | Payer: Self-pay | Attending: Obstetrics and Gynecology | Admitting: Obstetrics and Gynecology

## 2015-03-30 DIAGNOSIS — D251 Intramural leiomyoma of uterus: Secondary | ICD-10-CM | POA: Diagnosis not present

## 2015-03-30 DIAGNOSIS — N8320 Unspecified ovarian cysts: Secondary | ICD-10-CM | POA: Diagnosis not present

## 2015-03-30 DIAGNOSIS — Z79899 Other long term (current) drug therapy: Secondary | ICD-10-CM | POA: Diagnosis not present

## 2015-03-30 DIAGNOSIS — C541 Malignant neoplasm of endometrium: Secondary | ICD-10-CM | POA: Diagnosis not present

## 2015-03-30 DIAGNOSIS — Z87891 Personal history of nicotine dependence: Secondary | ICD-10-CM | POA: Diagnosis not present

## 2015-03-30 DIAGNOSIS — Z85828 Personal history of other malignant neoplasm of skin: Secondary | ICD-10-CM | POA: Diagnosis not present

## 2015-03-30 DIAGNOSIS — I4510 Unspecified right bundle-branch block: Secondary | ICD-10-CM | POA: Diagnosis not present

## 2015-03-30 DIAGNOSIS — R011 Cardiac murmur, unspecified: Secondary | ICD-10-CM | POA: Diagnosis not present

## 2015-03-30 DIAGNOSIS — N9971 Accidental puncture and laceration of a genitourinary system organ or structure during a genitourinary system procedure: Secondary | ICD-10-CM | POA: Diagnosis not present

## 2015-03-30 HISTORY — PX: BILATERAL SALPINGOOPHORECTOMY: SHX1223

## 2015-03-30 HISTORY — PX: LAPAROSCOPIC HYSTERECTOMY: SHX1926

## 2015-03-30 LAB — CBC WITH DIFFERENTIAL/PLATELET
Basophil #: 0 10*3/uL (ref 0.0–0.1)
Basophil %: 0.2 %
Eosinophil #: 0 10*3/uL (ref 0.0–0.7)
Eosinophil %: 0 %
HCT: 38.3 % (ref 35.0–47.0)
HGB: 12.6 g/dL (ref 12.0–16.0)
Lymphocyte #: 0.6 10*3/uL — ABNORMAL LOW (ref 1.0–3.6)
Lymphocyte %: 4 %
MCH: 30.2 pg (ref 26.0–34.0)
MCHC: 33 g/dL (ref 32.0–36.0)
MCV: 92 fL (ref 80–100)
Monocyte #: 0.2 x10 3/mm (ref 0.2–0.9)
Monocyte %: 1.7 %
NEUTROS PCT: 94.1 %
Neutrophil #: 13 10*3/uL — ABNORMAL HIGH (ref 1.4–6.5)
PLATELETS: 205 10*3/uL (ref 150–440)
RBC: 4.18 10*6/uL (ref 3.80–5.20)
RDW: 13.5 % (ref 11.5–14.5)
WBC: 13.8 10*3/uL — ABNORMAL HIGH (ref 3.6–11.0)

## 2015-03-31 DIAGNOSIS — N9971 Accidental puncture and laceration of a genitourinary system organ or structure during a genitourinary system procedure: Secondary | ICD-10-CM | POA: Diagnosis not present

## 2015-03-31 DIAGNOSIS — R011 Cardiac murmur, unspecified: Secondary | ICD-10-CM | POA: Diagnosis not present

## 2015-03-31 DIAGNOSIS — N8320 Unspecified ovarian cysts: Secondary | ICD-10-CM | POA: Diagnosis not present

## 2015-03-31 DIAGNOSIS — I4510 Unspecified right bundle-branch block: Secondary | ICD-10-CM | POA: Diagnosis not present

## 2015-03-31 DIAGNOSIS — D251 Intramural leiomyoma of uterus: Secondary | ICD-10-CM | POA: Diagnosis not present

## 2015-03-31 DIAGNOSIS — C541 Malignant neoplasm of endometrium: Secondary | ICD-10-CM | POA: Diagnosis not present

## 2015-04-04 LAB — SURGICAL PATHOLOGY

## 2015-04-04 LAB — CYTOLOGY - NON PAP

## 2015-04-10 NOTE — Consult Note (Signed)
PATIENT NAME:  Pamela Norman, REDDIN MR#:  [id number] DATE OF BIRTH:  11-20-1941  DATE OF CONSULTATION:  01/04/2015  CONSULTING PHYSICIAN:  Gonzella Lex, MD  IDENTIFYING INFORMATION AND REASON FOR CONSULTATION: This is a 74 year old woman with no past psychiatric history who was petitioned by the Emergency Room doctor because of confusion and bizarre statements earlier.   CHIEF COMPLAINT: "I'm fine."   HISTORY OF PRESENT ILLNESS: Information obtained from the patient and the chart. The patient came into the hospital with complaints of left-sided numbness. When she was first evaluated by the ER physician yesterday, evidently she seemed to be bizarre and not making sense. Made some odd statements such as evidently saying that she felt like her organs were on the outside of her body. She was judged to be confused and possibly psychotic.  On my evaluation today, the patient says that she came here because her left side had been feeling numb. Denied that she was having any weakness or trouble standing. No visual problems. Says this had been getting worse for a few days, but it was much worse on the day she came to the hospital. The patient denies having any mental health symptoms. Denies any problems with her mood. Denies any awareness as being confused or disoriented. Denies any hallucinations. Totally denies suicidal or homicidal ideation. When asked about the statement she had made yesterday, she says that she thinks that the physician was asking intentionally confusing questions and misunderstanding her. She denies that she said anything bizarre, but says that whatever she might have said she certainly does not believe that now. The patient denies that she is drinking or abusing any drugs. Says that she sleeps reasonably well. Appetite has been fine.   PAST PSYCHIATRIC HISTORY: No past psychiatric history at all. No psychiatric hospitalization. No psychiatric medicine. No history of suicide attempts or  violence.   PAST MEDICAL HISTORY: The patient is in quite good health for her age. No history of cardiovascular disease or cancer, not on any current medication.   SUBSTANCE ABUSE HISTORY: Says that she drinks wine occasionally. Does not feel like it has ever been a problem for her. Does not abuse any other drugs.   SOCIAL HISTORY: Lives alone. Has extended family as well as a large circle of friends in the area. 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 Says that she takes care of her house fine.   CURRENT MEDICATIONS: None.   ALLERGIES: No known drug allergies.   REVIEW OF SYSTEMS: Still says she has a little bit of numbness in her left arm and leg, but no difficulty walking or ambulating and does not have any other physical complaints. The rest of the whole review of systems is negative.   MENTAL STATUS EXAMINATION: Reasonably well-groomed woman, looks her stated age, cooperative and pleasant with the interview. Eye contact is good. Psychomotor activity normal. Speech normal rate, tone, and volume. Affect is euthymic, reactive, appropriate, smiling, upbeat. Mood stated as fine. Thoughts are lucid without loosening of associations or delusions. Denies auditory or visual hallucinations. Denies suicidal or homicidal ideation. She is alert and oriented x 4. She can repeat 3 words immediately, remembers all 3 at 3 minutes. Judgment and insight intact. She made 1 mistake spelling the word world backwards. I did not do a whole complete other Mini-Mental Status Exam. Does not appear to have any reason to think she has significant dementia.   IMAGING:  Her CAT scan  showed normal atrophy, nothing remarkable.   LABORATORY DATA: CBC is all normal. Chemistry panel: Elevated glucose 135, otherwise normal. She has a urinary tract infection.   EKG largely unremarkable.   VITAL SIGNS: Blood pressure is currently 140/66, respirations 16, pulse 78, temperature 97.5.    ASSESSMENT:  A 74 year old woman with a history no mental health problems, who presents to the hospital yesterday a bit confused, but whatever it is seems to have completely resolved at this point. Likely it was related to her urinary tract infection. Between yesterday and today, she has been given some ciprofloxacin. She received a dose of Geodon 20 mg intramuscularly stat last night, but has not had any other psychiatric medicine today. At this point, no indication that she has any mental health problem or needs any psychiatric treatment.   TREATMENT PLAN: Discussed with the Emergency Room doctor. The patient can be taken off involuntary commitment and released from the Emergency Room. Educated her about the potential for delirium with urinary tract infection. Encouraged her to complete the treatment for the UTI. No need for psychiatric followup.   DIAGNOSIS, PRINCIPAL AND PRIMARY:  AXIS I: Delirium secondary to urinary tract infection, resolved.   SECONDARY DIAGNOSES: AXIS I: None.  AXIS II: None. AXIS III: Urinary tract infection.   ____________________________ Gonzella Lex, MD jtc:LT D: 01/04/2015 17:10:44 ET T: 01/04/2015 17:19:40 ET JOB#: [id number]  cc: Gonzella Lex, MD, <Dictator> Gonzella Lex MD ELECTRONICALLY SIGNED 01/19/2015 17:21

## 2015-04-10 NOTE — Op Note (Signed)
PATIENT NAME:  Pamela Norman, Pamela Norman MR#:  [id number] DATE OF BIRTH:  30-Jun-1941  DATE OF PROCEDURE:    PREOPERATIVE DIAGNOSIS:  Postmenopausal bleeding.   POSTOPERATIVE DIAGNOSES:  1.  Postmenopausal bleeding.  2.  Endometrial polyps.   SURGICAL PROCEDURE:  Hysteroscopy/dilation and curettage.   SURGEON: Brayton Mars, M.D.   FIRST ASSISTANT:  Eula Flax PA-S.   ANESTHESIA: General, LMA.   INDICATIONS: The patient is a 74 year old single white female, para zero, menopausal, on no hormone replacement therapy who presents for surgical evaluation of postmenopausal bleeding. Preoperative ultrasound demonstrated a slightly thickened endometrial stripe.   FINDINGS AT SURGERY: Demonstrated a small uterus of normal size and shape; it was sounded to 8 cm. No adnexal masses were appreciated. The introitus was narrowed with an intact hymen. Hysteroscopy demonstrated polypoid tissue consistent with endometrial polyps.   DESCRIPTION OF PROCEDURE: The patient was brought to the Operating Room where she was placed in the supine position. General anesthesia by LMA was induced without difficulty. She was placed in the dorsal lithotomy position using the candycane stirrups. A Betadine perineal, intravaginal prep and drape was performed in standard fashion. A red Robinson catheter was used to drain 200 mL of urine from the bladder. A Pedersen speculum was placed into the vagina to visualize the cervix which was then grasped with a single-tooth tenaculum. A half Pedersen speculum was then maintained in order to facilitate exposure. The endocervical canal was dilated with Hanks dilators up to a #20 [place] caliber. The [facility] hysteroscope using lactated Ringer's as irrigant was used to assess the intrauterine cavity. The above-noted findings were photo documented. Curettage was then performed with both smooth and serrated curettes. Polyp forceps were used to help extract additional tissue. At the end of the  curettage, repeat hysteroscopy was performed and demonstrated an excellent sampling of tissue. The procedure was then terminated with all instrumentation being removed from the vagina. The patient was then awakened, mobilized, and taken to the recovery room in satisfactory condition.   ESTIMATED BLOOD LOSS: 50 mL.  INTRAVENOUS FLUIDS INFUSED:  600 mL.   URINE OUTPUT:  200 mL.   COUNTS: All instruments, needle, and sponge counts were verified as correct.    ____________________________ Alanda Slim. Carlis Burnsworth, MD mad:at D: 02/09/2015 13:21:59 ET T: 02/09/2015 20:50:32 ET JOB#: [id number]  cc: Hassell Done A. Trumaine Wimer, MD, <Dictator> Alanda Slim Mery Guadalupe MD ELECTRONICALLY SIGNED 02/14/2015 15:52

## 2015-04-10 NOTE — Op Note (Signed)
PATIENT NAME:  Pamela Norman, Pamela Norman MR#:  [id number] DATE OF BIRTH:  06-14-1941  DATE OF PROCEDURE:  03/30/2015  PREOPERATIVE DIAGNOSIS: Well-differentiated endometrial adenocarcinoma.  POSTOPERATIVE DIAGNOSIS: Well-differentiated endometrial adenocarcinoma.  PROCEDURE: Total laparoscopic hysterectomy, bilateral salpingo-oophorectomy, pelvic sentinel lymph node mapping, and repair of vaginal lacerations.   SURGEON: Mellody Drown, MD   ANESTHESIA: General endotracheal.  COMPLICATIONS: Vaginal lacerations due to insertion and removal of the VCare device.   OPERATIVE FINDINGS: On exam under anesthesia, the uterus was noted to be mobile and there was a 5 cm fibroid posteriorly and a 4 cm benign appearing right ovarian cyst. There were no masses or nodularity. Laparoscopic survey revealed normal uterus, tubes, and ovaries.  There was no evidence of metastatic cancer or enlarged nodes. The sentinel lymph node mapping was unsuccessful. Neither channels nor blue nodes were seen on either side. Frozen section showed a well-differentiated cancer that was only superficially invasive within the inner half of the myometrium. In view of this, further surgical staging was not performed.   PROCEDURE IN DETAIL: After the patient underwent general anesthesia with endotracheal intubation, she was prepped and draped in the dorsal lithotomy position in [place]. Methylene blue 2 mL were infiltrated in the cervix and 3 and 9 o'clock bilaterally for sentinel lymph node mapping. A Foley catheter was placed and a standard VCare device was placed in the uterus. The device was a medium VCare and it was inserted with some difficulty because the cups were about the same size as the vagina and required some manipulation to get them in place. Hasson trochar was then introduced in the umbilicus. Using an open technique, the skin was incised with a scalpel and then the fascia and peritoneum were separated bluntly.  Survey of the  abdomen was then completed with the above-noted findings. Lateral 5 mm trochar was placed bilaterally and a 12 mm trochar was placed suprapubically. Survey of the abdomen was then performed with the above-noted findings. Pelvic washings were then performed by instilling 60 mL of saline in the pelvis and aspirating this back for submission to cytology. The round ligaments were then taken down bilaterally with the LigaSure and the lateral pelvic peritoneum was divided. The ureters were identified and the infundibulopelvic ligaments were transected using the LigaSure. The perirectal and perivesical spaces were then then developed, but there was no evidence of blue lymphatic channels or blue sentinel lymph nodes. The hysterectomy was then completed by taking down the bladder flap with the EndoShears and skeletonizing the uterine vessels. The uterine arteries and veins were transected using the LigaSure down to the level of the VCare cup. The vaginal mucosa was then incised over the cup using the EndoShears. The VCare and specimen were then removed from below. Because of the tight fit of the [facility], it was difficult to get the instrument out. After the instrument and the uterus were removed, it became apparent that there were some lacerations around the vaginal introitus, 1 in the midline near the perineum, 1 on the patient's right, and then 1 suburethrally. These were sutured with interrupted 2-0 Vicryl figure-of-eights. A laceration was also noted on the left vaginal sidewall, and this was repaired using 2 figure-of-eights with 2-0 Vicryl with the endostitch, because it was too narrow to get other instruments up inside the vagina from below. At that point, the lacerations were closed, and there was no further bleeding noted. The field was completely dry, vaginally. From above, the vaginal cuff was closed with a running V-Loc  suture using the endostitch. The pressure was dropped and all of the pedicles in the entire  operative field appeared to be dry.   In view of the frozen section results showing well-differentiated cancer with less than 50% invasion, no further surgical staging was performed. The suprapubic trochar was closed with the [facility] device with a figure-of-eight of 2-0 Vicryl. The lateral 5 mm ports were then also removed. The gas was released from the abdomen, and the fascia in the umbilical incision was closed with an interrupted 2-0 Vicryl figure-of-eight suture. All of the skin incisions were closed with DermaBond glue. I then went down below and again, inspected the vagina and there was no further bleeding from the lacerations that had been repaired, and the Foley catheter was left in place, and the patient taken to the recovery room in good condition, extubated. Sponge, needle, and instrument counts were correct. Several sponges had been put into the abdomen sequentially during the laparoscopic procedure, and all of these were removed and again, the counts were correct.     ____________________________ Mellody Drown, MD ab:mw D: 03/30/2015 14:23:29 ET T: 03/30/2015 17:30:55 ET JOB#: [id number]  cc: Mellody Drown, MD, <Dictator> Mellody Drown MD ELECTRONICALLY SIGNED 03/31/2015 15:52

## 2015-04-18 ENCOUNTER — Telehealth: Payer: Self-pay | Admitting: *Deleted

## 2015-04-18 NOTE — Telephone Encounter (Signed)
Patient inquiring pathology results. Pt notified that she had stage 1 endometrial cancer based on pathology report.  Call returned. Pt thankful for returned call. Pt has a return apt on 05/04/15 with Dr. Theora Gianotti for post operative check.

## 2015-04-25 ENCOUNTER — Ambulatory Visit: Payer: TRICARE For Life (TFL)

## 2015-04-25 ENCOUNTER — Telehealth: Payer: Self-pay

## 2015-04-25 ENCOUNTER — Encounter: Payer: Medicare Other | Admitting: Internal Medicine

## 2015-04-25 ENCOUNTER — Telehealth: Payer: Self-pay | Admitting: *Deleted

## 2015-04-25 NOTE — Telephone Encounter (Signed)
Called patient on Friday 04/22/15 to asked if she would be willing to have medicare wellness visit done with the health coach. Patient stated she would prefer to see her PCP because she hasn't not seen her in a while and a lot of things have changed. I explained to the patient the health coach's role and she still could see her PCP as well. Patient became upset and stated that she did not understanding why her PCP did not want to see her. I explained to her that was not true and her PCP is always willing to see her as needed. Patient stated that she wanted to keep her appointment with PCP. Informed patient that was fine and hung up. Patient called back less than 10 minutes later and stated that she does want to have her visit done by the health coach. Patient stated that she did not realize I was calling from Dr. Thomes Dinning office and she thought I was a Press [employer] rep trying to trick her. Patient apologized for her previous comments and stated that she is more than willing to come in to see me for her wellness visit.

## 2015-04-25 NOTE — Telephone Encounter (Signed)
Spoke to patient who stated that she just wanted to apologize for not making it to her appointment today. Patient stated she just didn't feel up to driving today. Patient rescheduled to see PCP in June. Is not interested in a wellness visit at this time.

## 2015-05-04 ENCOUNTER — Inpatient Hospital Stay: Payer: Medicare Other | Attending: Obstetrics and Gynecology | Admitting: Obstetrics and Gynecology

## 2015-05-04 ENCOUNTER — Encounter: Payer: Self-pay | Admitting: *Deleted

## 2015-05-04 VITALS — BP 137/80 | HR 88 | Temp 98.2°F | Resp 18 | Ht 66.5 in | Wt 177.7 lb

## 2015-05-04 DIAGNOSIS — Z79899 Other long term (current) drug therapy: Secondary | ICD-10-CM

## 2015-05-04 DIAGNOSIS — Z90722 Acquired absence of ovaries, bilateral: Secondary | ICD-10-CM | POA: Diagnosis not present

## 2015-05-04 DIAGNOSIS — Z90710 Acquired absence of both cervix and uterus: Secondary | ICD-10-CM | POA: Insufficient documentation

## 2015-05-04 DIAGNOSIS — Z8673 Personal history of transient ischemic attack (TIA), and cerebral infarction without residual deficits: Secondary | ICD-10-CM

## 2015-05-04 DIAGNOSIS — C541 Malignant neoplasm of endometrium: Secondary | ICD-10-CM | POA: Insufficient documentation

## 2015-05-04 DIAGNOSIS — F419 Anxiety disorder, unspecified: Secondary | ICD-10-CM | POA: Diagnosis not present

## 2015-05-04 DIAGNOSIS — Z85828 Personal history of other malignant neoplasm of skin: Secondary | ICD-10-CM | POA: Diagnosis not present

## 2015-05-04 DIAGNOSIS — F17210 Nicotine dependence, cigarettes, uncomplicated: Secondary | ICD-10-CM

## 2015-05-04 DIAGNOSIS — E785 Hyperlipidemia, unspecified: Secondary | ICD-10-CM | POA: Insufficient documentation

## 2015-05-04 DIAGNOSIS — Z8542 Personal history of malignant neoplasm of other parts of uterus: Secondary | ICD-10-CM | POA: Insufficient documentation

## 2015-05-04 NOTE — Patient Instructions (Signed)
Calorie Counting for Weight Loss Calories are energy you get from the things you eat and drink. Your body uses this energy to keep you going throughout the day. The number of calories you eat affects your weight. When you eat more calories than your body needs, your body stores the extra calories as fat. When you eat fewer calories than your body needs, your body burns fat to get the energy it needs. Calorie counting means keeping track of how many calories you eat and drink each day. If you make sure to eat fewer calories than your body needs, you should lose weight. In order for calorie counting to work, you will need to eat the number of calories that are right for you in a day to lose a healthy amount of weight per week. A healthy amount of weight to lose per week is usually 1-2 lb (0.5-0.9 kg). A dietitian can determine how many calories you need in a day and give you suggestions on how to reach your calorie goal.   WHAT DO I NEED TO KNOW ABOUT CALORIE COUNTING? In order to meet your daily calorie goal, you will need to:  Find out how many calories are in each food you would like to eat. Try to do this before you eat.  Decide how much of the food you can eat.  Write down what you ate and how many calories it had. Doing this is called keeping a food log. WHERE DO I FIND CALORIE INFORMATION? The number of calories in a food can be found on a Nutrition Facts label. Note that all the information on a label is based on a specific serving of the food. If a food does not have a Nutrition Facts label, try to look up the calories online or ask your dietitian for help. HOW DO I DECIDE HOW MUCH TO EAT? To decide how much of the food you can eat, you will need to consider both the number of calories in one serving and the size of one serving. This information can be found on the Nutrition Facts label. If a food does not have a Nutrition Facts label, look up the information online or ask your dietitian for  help. Remember that calories are listed per serving. If you choose to have more than one serving of a food, you will have to multiply the calories per serving by the amount of servings you plan to eat. For example, the label on a package of bread might say that a serving size is 1 slice and that there are 90 calories in a serving. If you eat 1 slice, you will have eaten 90 calories. If you eat 2 slices, you will have eaten 180 calories. HOW DO I KEEP A FOOD LOG? After each meal, record the following information in your food log:  What you ate.  How much of it you ate.  How many calories it had.  Then, add up your calories. Keep your food log near you, such as in a small notebook in your pocket. Another option is to use a mobile app or website. Some programs will calculate calories for you and show you how many calories you have left each time you add an item to the log. WHAT ARE SOME CALORIE COUNTING TIPS?  Use your calories on foods and drinks that will fill you up and not leave you hungry. Some examples of this include foods like nuts and nut butters, vegetables, lean proteins, and high-fiber foods (more   than 5 g fiber per serving).  Eat nutritious foods and avoid empty calories. Empty calories are calories you get from foods or beverages that do not have many nutrients, such as candy and soda. It is better to have a nutritious high-calorie food (such as an avocado) than a food with few nutrients (such as a bag of chips).  Know how many calories are in the foods you eat most often. This way, you do not have to look up how many calories they have each time you eat them.  Look out for foods that may seem like low-calorie foods but are really high-calorie foods, such as baked goods, soda, and fat-free candy.  Pay attention to calories in drinks. Drinks such as sodas, specialty coffee drinks, alcohol, and juices have a lot of calories yet do not fill you up. Choose low-calorie drinks like water  and diet drinks.  Focus your calorie counting efforts on higher calorie items. Logging the calories in a garden salad that contains only vegetables is less important than calculating the calories in a milk shake.  Find a way of tracking calories that works for you. Get creative. Most people who are successful find ways to keep track of how much they eat in a day, even if they do not count every calorie. WHAT ARE SOME PORTION CONTROL TIPS?  Know how many calories are in a serving. This will help you know how many servings of a certain food you can have.  Use a measuring cup to measure serving sizes. This is helpful when you start out. With time, you will be able to estimate serving sizes for some foods.  Take some time to put servings of different foods on your favorite plates, bowls, and cups so you know what a serving looks like.  Try not to eat straight from a bag or box. Doing this can lead to overeating. Put the amount you would like to eat in a cup or on a plate to make sure you are eating the right portion.  Use smaller plates, glasses, and bowls to prevent overeating. This is a quick and easy way to practice portion control. If your plate is smaller, less food can fit on it.  Try not to multitask while eating, such as watching TV or using your computer. If it is time to eat, sit down at a table and enjoy your food. Doing this will help you to start recognizing when you are full. It will also make you more aware of what and how much you are eating. HOW CAN I CALORIE COUNT WHEN EATING OUT?  Ask for smaller portion sizes or child-sized portions.  Consider sharing an entree and sides instead of getting your own entree.  If you get your own entree, eat only half. Ask for a box at the beginning of your meal and put the rest of your entree in it so you are not tempted to eat it.  Look for the calories on the menu. If calories are listed, choose the lower calorie options.  Choose dishes  that include vegetables, fruits, whole grains, low-fat dairy products, and lean protein. Focusing on smart food choices from each of the 5 food groups can help you stay on track at restaurants.  Choose items that are boiled, broiled, grilled, or steamed.  Choose water, milk, unsweetened iced tea, or other drinks without added sugars. If you want an alcoholic beverage, choose a lower calorie option. For example, a regular margarita can have up   to 700 calories and a glass of wine has around 150.  Stay away from items that are buttered, battered, fried, or served with cream sauce. Items labeled "crispy" are usually fried, unless stated otherwise.  Ask for dressings, sauces, and syrups on the side. These are usually very high in calories, so do not eat much of them.  Watch out for salads. Many people think salads are a healthy option, but this is often not the case. Many salads come with bacon, fried chicken, lots of cheese, fried chips, and dressing. All of these items have a lot of calories. If you want a salad, choose a garden salad and ask for grilled meats or steak. Ask for the dressing on the side, or ask for olive oil and vinegar or lemon to use as dressing.  Estimate how many servings of a food you are given. For example, a serving of cooked rice is  cup or about the size of half a tennis ball or one cupcake wrapper. Knowing serving sizes will help you be aware of how much food you are eating at restaurants. The list below tells you how big or small some common portion sizes are based on everyday objects.  1 oz--4 stacked dice.  3 oz--1 deck of cards.  1 tsp--1 dice.  1 Tbsp-- a Ping-Pong ball.  2 Tbsp--1 Ping-Pong ball.   cup--1 tennis ball or 1 cupcake wrapper.  1 cup--1 baseball. Document Released: 11/26/2005 Document Revised: 04/12/2014 Document Reviewed: 10/01/2013 [employer] Patient Information 2015 [employer]. This information is not intended to replace advice given to  you by your health care provider. Make sure you discuss any questions you have with your health care provider. Exercise to Lose Weight Exercise and a healthy diet may help you lose weight. Your doctor may suggest specific exercises. EXERCISE IDEAS AND TIPS  Choose low-cost things you enjoy doing, such as walking, bicycling, or exercising to workout videos.  Take stairs instead of the elevator.  Walk during your lunch break.  Park your car further away from work or school.  Go to a gym or an exercise class.  Start with 5 to 10 minutes of exercise each day. Build up to 30 minutes of exercise 4 to 6 days a week.  Wear shoes with good support and comfortable clothes.  Stretch before and after working out.  Work out until you breathe harder and your heart beats faster.  Drink extra water when you exercise.  Do not do so much that you hurt yourself, feel dizzy, or get very short of breath. Exercises that burn about 150 calories:  Running 1  miles in 15 minutes.  Playing volleyball for 45 to 60 minutes.  Washing and waxing a car for 45 to 60 minutes.  Playing touch football for 45 minutes.  Walking 1  miles in 35 minutes.  Pushing a stroller 1  miles in 30 minutes.  Playing basketball for 30 minutes.  Raking leaves for 30 minutes.  Bicycling 5 miles in 30 minutes.  Walking 2 miles in 30 minutes.  Dancing for 30 minutes.  Shoveling snow for 15 minutes.  Swimming laps for 20 minutes.  Walking up stairs for 15 minutes.  Bicycling 4 miles in 15 minutes.  Gardening for 30 to 45 minutes.  Jumping rope for 15 minutes.  Washing windows or floors for 45 to 60 minutes. Document Released: 12/29/2010 Document Revised: 02/18/2012 Document Reviewed: 12/29/2010 [employer] Patient Information 2015 [employer]. This information is not intended to replace   advice given to you by your health care provider. Make sure you discuss any questions you have with your health care  provider.  

## 2015-05-04 NOTE — Progress Notes (Signed)
Gynecologic Oncology Interval Note  Referring Provider: Dr. Hassell Done Defrancesco  Chief Concern: Postoperative visit and counseling for endometrial cancer  Subjective:  Pamela Norman is a 74 y.o. female who is seen in consultation from Dr. Enzo Bi for endometrial cancer. Please refer to prior notes for complete details. On 03/30/15 she underwent total laparoscopic hysterectomy, bilateral salpingo-oophorectomy, pelvic sentinel lymph node mapping, and repair of vaginal lacerations. Her final pathology as noted below   Surgical Procedure  CASE: [id number]  PATIENT: Pamela Norman  Surgical Pathology Report      SPECIMEN SUBMITTED:  A. Uterus, cervix, and bilateral tubes and ovaries   CLINICAL HISTORY:  None provided   PRE-OPERATIVE DIAGNOSIS:  grade 1 endometrioid endometrial cancer   POST-OPERATIVE DIAGNOSIS:  Same/ total laparoscopic hysterectomy, bilateral salpingo-oophorectomy      DIAGNOSIS:  A. UTERUS; HYSTERECTOMY:  - ENDOMETRIOID CARCINOMA WITH SQUAMOUS DIFFERENTIATION, GRADE 1,  INVADING LESS THAN ONE HALF OF MYOMETRIUM.  - CERVIX NOT INVOLVED.  - INTRAMURAL LEIOMYOMA.   RIGHT AND LEFT FALLOPIAN TUBES AND OVARIES; SALPINGO-OOPHORECTOMY:  - NOT INVOLVED.  - BENIGN SIMPLE CYST OF RIGHT OVARY, 3.2 CM.   Comment:  The cervix was received in two detached pieces. The corpus was received  intact.   ENDOMETRIUM: Hysterectomy, With or Without Other Organs or Tissues  Endometrium, Hysterectomy, With or Without Other Organs or Tissues  Cancer Case Summary  Specimen: Uterine corpus  SPECIMEN  Procedure  Simple hysterectomy  Additional Procedures:  Bilateral salpingo-oophorectomy  Lymph Node Sampling:   Not performed  Specimen Integrity: Intact hysterectomy specimen  TUMOR  Histologic Type:  Endometrioid adenocarcinoma, variant (specify)  with squamous differentiation  Histologic Grade:  FIGO grade 1  EXTENT  Tumor Size:  Greatest dimension (cm)   3.5cm  Myometrial Invasion:   Present  Depth of Myometrial Invasion: Specify depth of invasion (mm)  85mm  Myometrial Thickness (mm):  37mm  Involvement of Cervix:  Not involved  Other Organs Submitted: Right ovary  Not involved  Left ovary  Not involved  Right fallopian tube  Not involved  Left fallopian tube  Not involved  ACCESSORY FINDINGS  Lymph-Vascular Invasion: Present  STAGE (pTNM [FIGO])  Primary Tumor (pT):  pT1a [IA]: Tumor limited to endometrium or invades less than one-half  of the myometrium  Regional Lymph Nodes (pN)  pNX: Cannot be assessed  Pelvic Lymph Nodes: No pelvic nodes submitted or found  Para-aortic Lymph Nodes: No para-aortic nodes submitted or found  Distant Metastasis (pM): Not applicable            Cytology: DIAGNOSIS:  A. PELVIC WASHINGS:  - NEGATIVE FOR MALIGNANCY.    Problem List: Patient Active Problem List   Diagnosis Date Noted  . Vaginal bleeding 01/07/2015  . Other and unspecified hyperlipidemia 05/21/2014  . Elevated blood sugar 05/21/2014  . Medicare annual wellness visit, subsequent 05/19/2013  . Screening for breast cancer 04/13/2013  . Special screening for malignant neoplasms, colon 04/13/2013    Past Medical History: Past Medical History  Diagnosis Date  . Heart murmur   . Hyperlipidemia   . Squamous cell carcinoma     face  . Cataract   . Endometrial cancer     Grade 1  . History of colonoscopy 2014    within normal limits  . History of mammogram 2015  . History of CVA (cerebrovascular accident)   . Rectal prolapse   . Anxiety   . History of ovarian cyst 01/2015    Past Surgical History: Past  Surgical History  Procedure Laterality Date  . Appendectomy    . Tonsilectomy, adenoidectomy, bilateral myringotomy and tubes    . Combined hysteroscopy diagnostic / d&c  2016  . Laparoscopic hysterectomy  03/30/15  . Bilateral salpingoophorectomy  03/30/15    Past Gynecologic History:  As per history  of present illness  OB History:  OB History  Gravida Para Term Preterm AB SAB TAB Ectopic Multiple Living  0 0 0 0 0 0 0 0 0 0       Obstetric Comments  Age at first menarche-age [age]    Family History: Family History  Problem Relation Age of Onset  . Colon cancer Father     older age onset  . COPD Father   . COPD Mother     Social History: History   Social History  . Marital Status: Single    Spouse Name: N/A  . Number of Children: N/A  . Years of Education: N/A   Occupational History  . Not on file.   Social History Main Topics  . Smoking status: Former Smoker    Quit date: 04/13/1981  . Smokeless tobacco: Never Used  . Alcohol Use: 1.2 oz/week    2 Glasses of wine per week  . Drug Use: No  . Sexual Activity: Not on file   Other Topics Concern  . Not on file   Social History Narrative   Lives in [place]. No children.      Work - retired Facilities manager records      Retired [employer]      Diet - regular diet, herbalife      Exercise - none regular, Horticulturist, commercial Classes    Allergies: No Known Allergies  Current Medications: Current Outpatient Prescriptions  Medication Sig Dispense Refill  . diazepam (VALIUM) 5 MG tablet Take 1 tablet (5 mg total) by mouth every 8 (eight) hours as needed for anxiety or muscle spasms. 30 tablet 0   No current facility-administered medications for this visit.    Review of Systems General: negative for, fevers, chills, fatigue Skin: Negative HEENT: negative for, pain, sore throat, neck masses Pulmonary: negative for, dyspnea, productive cough Cardiac: negative for, pain, discomfort, pressure Gastrointestinal: negative for, nausea, vomiting, constipation, diarrhea Genitourinary/Sexual: negative for, dysuria Ob/Gyn: negative for, irregular bleeding, pain Musculoskeletal: positive for feeling her spine is twisted.  Hematology: negative for, easy bruising, bleeding Neurologic/Psych: negative  for, weakness  Objective:   Filed Vitals:   05/04/15 1506  BP: 137/80  Pulse: 88  Temp: 98.2 F (36.8 C)  TempSrc: Tympanic  Resp: 18  Height: 5' 6.5" (1.689 m)  Weight: 177 lb 11.1 oz (80.6 kg)  SpO2: 98%      ECOG Performance Status: 1 - Symptomatic but completely ambulatory  General appearance: alert, cooperative and appears stated age HEENT: ATNC Abdomen: no palpable masses, no hernias, well healed incisions, soft, nontender, nondistended. Umbilicus normal.  Extremities: no lower extremity edema Neurological exam reveals alert, oriented, normal speech, no focal findings or movement disorder noted  Pelvic: Patient climbed     Assessment:  Pamela Norman is a 74 y.o. female diagnosed with stage IA, grade 1, Endometrioid adenocarcinoma of the endometrium with positive LVSI.  Plan:   Problem List Items Addressed This Visit    None    Visit Diagnoses    Endometrial cancer    -  Primary    Relevant Orders    Ambulatory referral to Radiation Oncology  We discussed options for management of high intermediate risk cancer. Her only criteria for HIR are the presence of lymphovascular space invasion and her age.. Therefore I think her risk of recurrence is lower than the approximately 26% noted in GOG 99 without treatment. I did discuss with her the options of continued surveillance versus radiation therapy. I do think it is important for her to have a radiation oncology visit with Dr. Baruch Gouty to review the benefits of radiation as well as the risks.   I also reviewed with her the importance of decreasing her weight and exercising as well as improving nutrition. There are several reports regarding the association between weight and survival outcomes in women with endometrial cancer.  We also discussed surveillance and I like to see her again in 3 months after this visit. After that we can start alternating with Dr. Enzo Bi every 6 months for 5  years.   Gillis Ends, MD    CC:  Referring Provider: Dr. Hassell Done Defrancesco  Jackolyn Confer, MD [address] [place], [place] [address] [phone]

## 2015-05-10 ENCOUNTER — Telehealth: Payer: Self-pay | Admitting: Internal Medicine

## 2015-05-10 NOTE — Telephone Encounter (Signed)
Pt request urology referral  For lower body is hurting

## 2015-05-10 NOTE — Telephone Encounter (Signed)
Pt scheduled appoint on 6.1.16 at 9 am with Lorane Gell for Neurology referral.

## 2015-05-11 ENCOUNTER — Encounter: Payer: Self-pay | Admitting: Nurse Practitioner

## 2015-05-11 ENCOUNTER — Ambulatory Visit (INDEPENDENT_AMBULATORY_CARE_PROVIDER_SITE_OTHER): Payer: Medicare Other | Admitting: Nurse Practitioner

## 2015-05-11 VITALS — BP 118/80 | HR 70 | Temp 98.2°F | Resp 16 | Ht 66.5 in | Wt 175.1 lb

## 2015-05-11 DIAGNOSIS — M542 Cervicalgia: Secondary | ICD-10-CM | POA: Diagnosis not present

## 2015-05-11 MED ORDER — DIAZEPAM 5 MG PO TABS: 5.0000 mg | ORAL_TABLET | Freq: Every day | ORAL | Status: DC

## 2015-05-11 NOTE — Patient Instructions (Signed)
We will contact you about your referral to Neurology. (placed as urgent)  Please follow up with Dr. Gilford Rile this month.

## 2015-05-11 NOTE — Progress Notes (Signed)
Subjective:    Patient ID: Pamela Norman, female    DOB: 1941/07/21, 74 y.o.   MRN: [id number]  HPI  Pamela Norman is a 74 yo female with a CC of back pain, neck pain, and depression. She is accompanied by her friend Diane today.   1) Neck pain- starts at the neck and goes down her back and "twists" pain and neck pops.   Pain increased since January- per Diane The patient is very anxious and feels something is very wrong despite being worked up in the ED at [facility] in Jan. 2016. She feels her neck is like the exorcist and it moves when her body has not moved.   2) Depression- Takes valium once a week, helps her sleep, she is asking for a refill. She feels this "twisting" and "her spine is off" is affecting her and making her more depressed.  PHQ-2= 2 PHQ-9= 3   Review of Systems  Constitutional: Negative for fever, chills, diaphoresis and fatigue.  Respiratory: Negative for chest tightness, shortness of breath and wheezing.   Cardiovascular: Negative for chest pain, palpitations and leg swelling.  Gastrointestinal: Negative for nausea, vomiting and diarrhea.  Musculoskeletal: Positive for back pain and neck pain.  Skin: Negative for rash.  Neurological: Positive for numbness. Negative for dizziness, weakness and headaches.  Psychiatric/Behavioral: Positive for sleep disturbance. Negative for suicidal ideas. The patient is nervous/anxious.    Past Medical History  Diagnosis Date  . Heart murmur   . Hyperlipidemia   . Squamous cell carcinoma     face  . Cataract   . Endometrial cancer     Grade 1  . History of colonoscopy 2014    within normal limits  . History of mammogram 2015  . History of CVA (cerebrovascular accident)   . Rectal prolapse   . Anxiety   . History of ovarian cyst 01/2015    History   Social History  . Marital Status: Single    Spouse Name: N/A  . Number of Children: N/A  . Years of Education: N/A   Occupational History  . Not on file.   Social  History Main Topics  . Smoking status: Former Smoker    Quit date: 04/13/1981  . Smokeless tobacco: Never Used  . Alcohol Use: 1.2 oz/week    2 Glasses of wine per week  . Drug Use: No  . Sexual Activity: Not on file   Other Topics Concern  . Not on file   Social History Narrative   Lives in [place]. No children.      Work - retired Facilities manager records      Retired [employer]      Diet - regular diet, herbalife      Exercise - none regular, [employer] Classes    Past Surgical History  Procedure Laterality Date  . Appendectomy    . Tonsilectomy, adenoidectomy, bilateral myringotomy and tubes    . Combined hysteroscopy diagnostic / d&c  2016  . Laparoscopic hysterectomy  03/30/15  . Bilateral salpingoophorectomy  03/30/15    Family History  Problem Relation Age of Onset  . Colon cancer Father     older age onset  . COPD Father   . COPD Mother     No Known Allergies  No current outpatient prescriptions on file prior to visit.   No current facility-administered medications on file prior to visit.      Objective:   Physical Exam  Constitutional:  She is oriented to person, place, and time. She appears well-developed and well-nourished. No distress.  BP 118/80 mmHg  Pulse 70  Temp(Src) 98.2 F (36.8 C)  Resp 16  Ht 5' 6.5" (1.689 m)  Wt 175 lb 1.9 oz (79.434 kg)  BMI 27.84 kg/m2  SpO2 98%   HENT:  Head: Normocephalic and atraumatic.  Right Ear: External ear normal.  Left Ear: External ear normal.  Eyes: Conjunctivae and EOM are normal. Pupils are equal, round, and reactive to light. Right eye exhibits no discharge. Left eye exhibits no discharge. No scleral icterus.  Neck: Normal range of motion. Neck supple. No thyromegaly present.  Cardiovascular: Normal rate, regular rhythm and normal heart sounds.  Exam reveals no gallop and no friction rub.   No murmur heard. Pulmonary/Chest: Effort normal and breath sounds normal. No  respiratory distress. She has no wheezes. She has no rales. She exhibits no tenderness.  Lymphadenopathy:    She has no cervical adenopathy.  Neurological: She is alert and oriented to person, place, and time. No cranial nerve deficit. She exhibits normal muscle tone. Coordination normal.  Deltoid 5/5 Bilateral, Biceps 5/5 bilateral, Wrist extensors 5/5 bilateral, Triceps 5/5 bilateral, finger flexors and abductors 5/5 bilateral, grip 5/5 bilateraly no Hoffman's, intact heel/toe/sequential walking, sensation intact upper and lower extremities. Straight leg raise negative bilaterally. DTR's upper and lower 2+   Skin: Skin is warm and dry. No rash noted. She is not diaphoretic.  Psychiatric: She has a normal mood and affect. Her behavior is normal. Judgment and thought content normal.      Assessment & Plan:

## 2015-05-11 NOTE — Assessment & Plan Note (Addendum)
Pt is upset and really wanting to be seen soon by neurology for her "twisting" and off feeling of her spine. Due to her history it is difficult to determine what exactly is wrong. Discussed getting x-rays first, but feels if neurology will see her soon she can wait. She keeps saying "I don't know if I'll live that long" and she is referring to her "condition". She states she is not having thoughts of hurting herself or others. FU prn worsening/failure to improve.   She is wanting to be taken seriously because after her last complaint she found out she had endometrial cancer and felt we really doing a good job with getting her the help she needs.

## 2015-05-12 ENCOUNTER — Institutional Professional Consult (permissible substitution): Payer: Medicare Other | Admitting: Radiation Oncology

## 2015-05-17 ENCOUNTER — Institutional Professional Consult (permissible substitution): Payer: Medicare Other | Admitting: Radiation Oncology

## 2015-05-17 ENCOUNTER — Telehealth: Payer: Self-pay | Admitting: *Deleted

## 2015-05-17 NOTE — Telephone Encounter (Signed)
Appointment cancelled, patient will call back at a later time to reschedule.

## 2015-05-19 ENCOUNTER — Telehealth: Payer: Self-pay

## 2015-05-19 NOTE — Telephone Encounter (Signed)
The pt called and is hoping to get a call back regarding her apt she had last week (6/1).  She states she is having neuropathy, and it is worse today.  She is hoping the referral can be expedited

## 2015-05-22 ENCOUNTER — Encounter: Payer: Self-pay | Admitting: General Practice

## 2015-05-22 ENCOUNTER — Emergency Department: Payer: Medicare Other

## 2015-05-22 ENCOUNTER — Emergency Department
Admission: EM | Admit: 2015-05-22 | Discharge: 2015-05-22 | Disposition: A | Discharge status: Home / Self Care | Payer: Medicare Other | Attending: Emergency Medicine | Admitting: Emergency Medicine

## 2015-05-22 DIAGNOSIS — Z87891 Personal history of nicotine dependence: Secondary | ICD-10-CM | POA: Insufficient documentation

## 2015-05-22 DIAGNOSIS — R202 Paresthesia of skin: Secondary | ICD-10-CM

## 2015-05-22 DIAGNOSIS — M542 Cervicalgia: Secondary | ICD-10-CM | POA: Diagnosis not present

## 2015-05-22 DIAGNOSIS — M62838 Other muscle spasm: Secondary | ICD-10-CM | POA: Diagnosis present

## 2015-05-22 DIAGNOSIS — R209 Unspecified disturbances of skin sensation: Secondary | ICD-10-CM | POA: Diagnosis not present

## 2015-05-22 DIAGNOSIS — R262 Difficulty in walking, not elsewhere classified: Secondary | ICD-10-CM | POA: Diagnosis not present

## 2015-05-22 DIAGNOSIS — F23 Brief psychotic disorder: Secondary | ICD-10-CM | POA: Diagnosis not present

## 2015-05-22 MED ORDER — DIAZEPAM 5 MG PO TABS
ORAL_TABLET | ORAL | Status: AC
  Administered 2015-05-22: 5 mg via ORAL
  Filled 2015-05-22: qty 1

## 2015-05-22 MED ORDER — DIAZEPAM 5 MG PO TABS
5.0000 mg | ORAL_TABLET | Freq: Once | ORAL | Status: AC
  Administered 2015-05-22: 5 mg via ORAL

## 2015-05-22 NOTE — Discharge Instructions (Signed)
It is not clear what is causing the sensation you have of something shooting down and crossing over or parts of your body being out of alignment. Please follow-up with a neurologist as planned tomorrow.  Follow-up with your regular doctor, Dr. Ronette Deter, either later this week or the week after.   Paresthesia Paresthesia is an abnormal burning or prickling sensation. This sensation is generally felt in the hands, arms, legs, or feet. However, it may occur in any part of the body. It is usually not painful. The feeling may be described as:  Tingling or numbness.  "Pins and needles."  Skin crawling.  Buzzing.  Limbs "falling asleep."  Itching. Most people experience temporary (transient) paresthesia at some time in their lives. CAUSES  Paresthesia may occur when you breathe too quickly (hyperventilation). It can also occur without any apparent cause. Commonly, paresthesia occurs when pressure is placed on a nerve. The feeling quickly goes away once the pressure is removed. For some people, however, paresthesia is a long-lasting (chronic) condition caused by an underlying disorder. The underlying disorder may be:  A traumatic, direct injury to nerves. Examples include a:  Broken (fractured) neck.  Fractured skull.  A disorder affecting the brain and spinal cord (central nervous system). Examples include:  Transverse myelitis.  Encephalitis.  Transient ischemic attack.  Multiple sclerosis.  Stroke.  Tumor or blood vessel problems, such as an arteriovenous malformation pressing against the brain or spinal cord.  A condition that damages the peripheral nerves (peripheral neuropathy). Peripheral nerves are not part of the brain and spinal cord. These conditions include:  Diabetes.  Peripheral vascular disease.  Nerve entrapment syndromes, such as carpal tunnel syndrome.  Shingles.  Hypothyroidism.  Vitamin B12 deficiencies.  Alcoholism.  Heavy metal poisoning  (lead, arsenic).  Rheumatoid arthritis.  Systemic lupus erythematosus. DIAGNOSIS  Your caregiver will attempt to find the underlying cause of your paresthesia. Your caregiver may:  Take your medical history.  Perform a physical exam.  Order various lab tests.  Order imaging tests. TREATMENT  Treatment for paresthesia depends on the underlying cause. HOME CARE INSTRUCTIONS  Avoid drinking alcohol.  You may consider massage or acupuncture to help relieve your symptoms.  Keep all follow-up appointments as directed by your caregiver. SEEK IMMEDIATE MEDICAL CARE IF:   You feel weak.  You have trouble walking or moving.  You have problems with speech or vision.  You feel confused.  You cannot control your bladder or bowel movements.  You feel numbness after an injury.  You faint.  Your burning or prickling feeling gets worse when walking.  You have pain, cramps, or dizziness.  You develop a rash. MAKE SURE YOU:  Understand these instructions.  Will watch your condition.  Will get help right away if you are not doing well or get worse. Document Released: 11/16/2002 Document Revised: 02/18/2012 Document Reviewed: 08/17/2011 [facility] Patient Information 2015 [place], [place]. This information is not intended to replace advice given to you by your health care provider. Make sure you discuss any questions you have with your health care provider.

## 2015-05-22 NOTE — ED Notes (Signed)
Nurse consulted with MD's about protocols needed for pt at this time. MD ( Dr. Hulan Saas. And Dr. Thomasene Lot) both verbalized that no protocols are needed at this time.

## 2015-05-22 NOTE — ED Notes (Addendum)
Pt states "I'm not lining up right.  My labia are crossed over.  It's not lining up on the side.  My legs are matching up together".  "Something is wrapped around and it's wrong, very wrong".  Pt states been going on for a long time and today more serious.  "It's going in wrong here and it's going in wrong here" while pointing to inner thighs.  Pt repeating concerns, seeming to have trouble finding words to describe issues.  Pt assessed by nurse and MD together.

## 2015-05-22 NOTE — ED Provider Notes (Signed)
[facility] Emergency Department Provider Note  ____________________________________________  Time seen: 76  I have reviewed the triage vital signs and the nursing notes.   HISTORY  Chief Complaint Spasms and Neck Pain  something running down through her body, crossing over, causing a "pulling"    HPI Pamela Norman is a 74 y.o. female who presents to the emergency department due to her concerns for a neurologic problem in which an indescribable sensation or element is running down her biting and "crossing over". The patient is quite vague about what this sensation or into T that is running down through her body is. She is repetitive and uses quite vague terms. She says it begins at her neck and runs down and then crosses over at her but talks. She repeatedly says that things are not aligned properly. She describes this thing that is running down as causing a "pulling" but she denies muscle spasm or muscle activation. She tells me she knows quite a bit about physiology and Kinesiology, but is unable to exactly describe this "real" saying that has been occurring for approximately 1 year.  The symptoms have come and gone in the past. She was seen in the emergency department in January and a psychiatric evaluation was performed due to her communication and affect. She has seen her regular physician about this as well. Dr. Ronette Deter has arranged for the patient to see a neurologist in [place]. This appointment is tomorrow. The patient presents to the emergency department because she says her symptoms are getting worse and she is worried about it. She presents with a friend of hers who is familiar with the range of symptoms that she has. The friend has difficulty helping me understand the more tangible aspects of the case as well. The friend does believe there something real going on as the patient had applied ice to her own neck and apparently this resolved all her  symptoms of this energy that crosses over.  The patient was diagnosed with gynecological cancer in January and had a hysterectomy.     Past Medical History  Diagnosis Date  . Heart murmur   . Hyperlipidemia   . Squamous cell carcinoma     face  . Cataract   . Endometrial cancer     Grade 1  . History of colonoscopy 2014    within normal limits  . History of mammogram 2015  . History of CVA (cerebrovascular accident)   . Rectal prolapse   . Anxiety   . History of ovarian cyst 01/2015    Patient Active Problem List   Diagnosis Date Noted  . Cervicalgia 05/11/2015  . Endometrial cancer 05/04/2015  . Vaginal bleeding 01/07/2015  . Other and unspecified hyperlipidemia 05/21/2014  . Elevated blood sugar 05/21/2014  . Medicare annual wellness visit, subsequent 05/19/2013  . Screening for breast cancer 04/13/2013  . Special screening for malignant neoplasms, colon 04/13/2013    Past Surgical History  Procedure Laterality Date  . Appendectomy    . Tonsilectomy, adenoidectomy, bilateral myringotomy and tubes    . Combined hysteroscopy diagnostic / d&c  2016  . Laparoscopic hysterectomy  03/30/15  . Bilateral salpingoophorectomy  03/30/15    Current Outpatient Rx  Name  Route  Sig  Dispense  Refill  . diazepam (VALIUM) 5 MG tablet   Oral   Take 1 tablet (5 mg total) by mouth daily.   30 tablet   0     Allergies Review of patient's  allergies indicates no known allergies.  Family History  Problem Relation Age of Onset  . Colon cancer Father     older age onset  . COPD Father   . COPD Mother     Social History History  Substance Use Topics  . Smoking status: Former Smoker    Quit date: 04/13/1981  . Smokeless tobacco: Never Used  . Alcohol Use: 1.2 oz/week    2 Glasses of wine per week    Review of Systems  Constitutional: Negative for fever. ENT: Negative for sore throat. Cardiovascular: Negative for chest pain. Respiratory: Negative for shortness of  breath. Gastrointestinal: Negative for abdominal pain, vomiting and diarrhea. Genitourinary: Negative for dysuria. Musculoskeletal: She should feel things are out of alignment. See history of present illness Skin: Negative for rash. Neurological: See history of present illness for description of this perception of an energy that crosses over and pulled things out of alignment for her.    10-point ROS otherwise negative.  ____________________________________________   PHYSICAL EXAM:  VITAL SIGNS: ED Triage Vitals  Enc Vitals Group     BP 05/22/15 1716 158/80 mmHg     Pulse Rate 05/22/15 1716 77     Resp 05/22/15 1716 18     Temp 05/22/15 1716 97.7 F (36.5 C)     Temp Source 05/22/15 1716 Oral     SpO2 05/22/15 1716 100 %     Weight 05/22/15 1721 170 lb (77.111 kg)     Height 05/22/15 1721 5\' 6"  (1.676 m)     Head Cir --      Peak Flow --      Pain Score 05/22/15 1721 0     Pain Loc --      Pain Edu? --      Excl. in [place]? --     Constitutional: Alert and oriented. Well appearing and interactive, but she becomes white tangential and avoids answering certain questions through the interview process. ENT   Head: Normocephalic and atraumatic.   Nose: No congestion/rhinnorhea.   Mouth/Throat: Mucous membranes are moist. Cardiovascular: Normal rate, regular rhythm. Respiratory: Normal respiratory effort without tachypnea. Breath sounds are clear and equal bilaterally. No wheezes/rales/rhonchi. Gastrointestinal: Soft and nontender. No distention.  Back: No muscle spasm, no tenderness, no CVA tenderness. Musculoskeletal: Nontender with normal range of motion in all extremities.  No noted edema. Sacrum: The anus has healed hemorrhoids present. No rectal exam was performed. The sacral area appears normal on exam despite the patient's insistence that that is where the fainting runs down and enters and crosses over. Neurologic: Negative Romberg, negative pronator drift,  normal finger to nose, 5 or 5 strength in all 4 extremities, equal reflexes at both knees. Normal 1-2 beat clonus in both feet. Sensation is equal in the lower extremities. Sensation is equal in the perianal and buttock tox area. Skin:  Healed surgical scars noted on abdomen. Psychiatric: Pleasant, interactive, makes jokes, introduce this herself in a fairly normal manner but with mild agitation. She becomes very focused on trying to convince me and others that her symptoms are real. This may be the case and we have worked hard to understand the sensation she has or what is running down through her body but she is tangential and vague about these descriptions and when I ask her [place] questions about how long its been there she simply does not respond and continues to describe other aspects of her concerns and sensations. ____________________________________________    LABS (pertinent positives/negatives)  Lab testing declined by patient  ____________________________________________    RADIOLOGY  CT head IMPRESSION: No acute intracranial pathology   ____________________________________________  ____________________________________________   INITIAL IMPRESSION / ASSESSMENT AND PLAN / ED COURSE  The difficult situation as the patient has a perception of a problem that she either cannot communicate in a way that I can understand it or that may be supratentorial. Neurologically the patient is intact. I do not believe there is an emergent situation occurring as she has had symptoms like this for approximately 6 months. She has immediate follow-up tomorrow to see a neurologist. I think obtaining a CT of her head is not unreasonable given her recent cancer history. The patient agrees to the CT of her head. She is concerned about her neck area and we will not do a CT of her cervical spine as I think that if she needs any imaging in this area she needs an MRI; however, given the fact that she  is neurologically intact, I do not see any indication for that imaging currently. I have also ordered various labs including, but the patient is declining this.  The patient's friend overall agrees with the points that I have made in private conversation with her and is supportive of getting the CT of the patient's head.  ----------------------------------------- 9:11 PM on 05/22/2015 -----------------------------------------  Head CT is negative. We will discharge patient for outpatient follow-up with neurology tomorrow. Patient will need to follow-up with her primary physician, Ronette Deter, as well.  ____________________________________________   FINAL CLINICAL IMPRESSION(S) / ED DIAGNOSES  Final diagnoses:  Paresthesia  anxiety     Ahmed Prima, MD 05/22/15 2116

## 2015-05-22 NOTE — ED Notes (Signed)
Pt. Arrived to ed from home via ems. Reports pt is experiencing "nerve pain that has crossed over in my body, and now its pulling and i can not straighten it out". Pt anxious in triage. Pt reports that she has a hysterometry in April. Pt has a scheduled apt tomorrow for neurology. Pt alert and oriented to self in triage. Pt states "i know it doesn't make since, but my nerve endings are crossed in my body". Pt states "i just need to stretch it".

## 2015-05-22 NOTE — ED Notes (Signed)
Pt refusing lab work.  Pt only consenting to CT of head.

## 2015-05-23 ENCOUNTER — Ambulatory Visit (INDEPENDENT_AMBULATORY_CARE_PROVIDER_SITE_OTHER): Payer: Medicare Other | Admitting: Neurology

## 2015-05-23 ENCOUNTER — Encounter: Payer: Self-pay | Admitting: Neurology

## 2015-05-23 VITALS — BP 135/91 | HR 82 | Ht 66.5 in | Wt 170.0 lb

## 2015-05-23 DIAGNOSIS — R269 Unspecified abnormalities of gait and mobility: Secondary | ICD-10-CM

## 2015-05-23 DIAGNOSIS — R202 Paresthesia of skin: Secondary | ICD-10-CM | POA: Diagnosis not present

## 2015-05-23 NOTE — Progress Notes (Signed)
PATIENT: Pamela Norman DOB: [birth year]  Chief Complaint  Patient presents with  . Back Pain    Reports dull, achy pain in neck and low back.  Feels her arms and legs are weak.  Says symptoms have worsened over the last year.    HISTORICAL  Pamela Norman is a 74 years old right-handed female, of accompanied by her friend Pamela Norman, referred by her primary care physician Dr. Ronette Deter for evaluation of upper and lower extremity weakness   I have reviewed refer, ,She had total hysterectomy in April 2016 for endometrial cancer, stage I, is planning on to have potential low-dose radiation therapy, she retired from [employer] very active all her life  Since 2015, she noticed gradual onset shoulder neck achiness, also radiating paresthesia from her neck to bilateral upper extremity bilateral hands, she also noticed mild unbalanced gait, "as is my body is out of alignment".  She is very bothered by her symptoms, actually presented to emergency room in May 22 2015, I have  Personally reviewed CT head without contrast that was normal  Laboratory evaluations mild elevated WBC 13 point 8, otherwise normal CBC, CMP.  Her symptoms are gradually getting worse, she also described traveling discomfort along her spine, suggestive of Lhermitte sign, she denies falling episodes, no bowel and bladder incontinence, no bulbar weakness, no visual change.   REVIEW OF SYSTEMS: Full 14 system review of systems performed and notable only for joint pain, achy muscles, numbness  ALLERGIES: No Known Allergies  HOME MEDICATIONS: Current Outpatient Prescriptions  Medication Sig Dispense Refill  . diazepam (VALIUM) 5 MG tablet Take 1 tablet (5 mg total) by mouth daily. 30 tablet 0     PAST MEDICAL HISTORY: Past Medical History  Diagnosis Date  . Heart murmur   . Hyperlipidemia   . Squamous cell carcinoma     face  . Cataract   . Endometrial cancer     Grade 1  . History of colonoscopy 2014   within normal limits  . History of mammogram 2015  . History of CVA (cerebrovascular accident)   . Rectal prolapse   . Anxiety   . History of ovarian cyst 01/2015    PAST SURGICAL HISTORY: Past Surgical History  Procedure Laterality Date  . Appendectomy    . Tonsilectomy, adenoidectomy, bilateral myringotomy and tubes    . Combined hysteroscopy diagnostic / d&c  2016  . Laparoscopic hysterectomy  03/30/15  . Bilateral salpingoophorectomy  03/30/15  . Dnc    . Dilation and curettage of uterus      FAMILY HISTORY: Family History  Problem Relation Age of Onset  . Colon cancer Father     older age onset  . COPD Father   . COPD Mother     SOCIAL HISTORY:  History   Social History  . Marital Status: Single    Spouse Name: N/A  . Number of Children: 0  . Years of Education: PhD   Occupational History  . Retired    Social History Main Topics  . Smoking status: Former Smoker    Quit date: 04/13/1981  . Smokeless tobacco: Never Used  . Alcohol Use: 1.2 oz/week    2 Glasses of wine per week     Comment: Occasional wine.  . Drug Use: No  . Sexual Activity: Not on file   Other Topics Concern  . Not on file   Social History Narrative   Lives in [place]. No children.  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      1-2 cups caffeine daily.    PHYSICAL EXAM   Filed Vitals:   05/23/15 1311  BP: 135/91  Pulse: 82  Height: 5' 6.5" (1.689 m)  Weight: 170 lb (77.111 kg)    Not recorded      Body mass index is 27.03 kg/(m^2).  PHYSICAL EXAMNIATION:  Gen: NAD, conversant, well nourised, obese, well groomed                     Cardiovascular: Regular rate rhythm, no peripheral edema, warm, nontender. Eyes: Conjunctivae clear without exudates or hemorrhage Neck: Supple, no carotid bruise. Pulmonary: Clear to auscultation bilaterally   NEUROLOGICAL  EXAM:  MENTAL STATUS: Speech:    Speech is normal; fluent and spontaneous with normal comprehension.  Cognition:    The patient is oriented to person, place, and time;     recent and remote memory intact;     language fluent;     normal attention, concentration,     fund of knowledge.  CRANIAL NERVES: CN II: Visual fields are full to confrontation. Fundoscopic exam is normal with sharp discs and no vascular changes.Pupil were equal round reactive to light CN III, IV, VI: extraocular movement are normal. No ptosis. CN V: Facial sensation is intact to pinprick in all 3 divisions bilaterally. Corneal responses are intact.  CN VII: Face is symmetric with normal eye closure and smile. CN VIII: Hearing is normal to rubbing fingers CN IX, X: Palate elevates symmetrically. Phonation is normal. CN XI: Head turning and shoulder shrug are intact CN XII: Tongue is midline with normal movements and no atrophy.  MOTOR: There is no pronator drift of out-stretched arms. Muscle bulk and tone are normal. Muscle strength is normal.  REFLEXES: Reflexes are 3  and symmetric at the biceps, triceps, knees, and ankles. Plantar responses are extensor bilaterally.  SENSORY: Light touch, pinprick, position sense, and vibration sense are intact in fingers and toes.  COORDINATION: Rapid alternating movements and fine finger movements are intact. There is no dysmetria on finger-to-nose and heel-knee-shin. There are no abnormal or extraneous movements.   GAIT/STANCE: Posture is normal. Gait is steady with normal steps, base, arm swing, and turning. Heel and toe walking are normal. Mild difficulty with tandem walking Romberg is absent.   DIAGNOSTIC DATA (LABS, IMAGING, TESTING) - I reviewed patient records, labs, notes, testing and imaging myself where available.  Lab Results  Component Value Date   WBC 13.8* 03/30/2015   HGB 12.6 03/30/2015   HCT 38.3 03/30/2015   MCV 92 03/30/2015   PLT 205  03/30/2015      Component Value Date/Time   NA 141 01/07/2015 1805   NA 143 01/03/2015 1636   K 4.0 01/07/2015 1805   K 3.5 01/03/2015 1636   CL 104 01/07/2015 1805   CL 107 01/03/2015 1636   CO2 26 01/07/2015 1805   CO2 30 01/03/2015 1636   GLUCOSE 175* 01/07/2015 1805   GLUCOSE 135* 01/03/2015 1636   BUN 19 01/07/2015 1805   BUN 17 01/03/2015 1636   CREATININE 0.81 01/07/2015 1805   CREATININE 0.89 01/03/2015 1636   CALCIUM 9.1 01/07/2015 1805   CALCIUM 8.9 01/03/2015 1636   PROT 7.1 01/07/2015 1805   PROT 7.3 01/03/2015 1636   ALBUMIN 4.2  01/07/2015 1805   ALBUMIN 3.8 01/03/2015 1636   AST 27 01/07/2015 1805   AST 19 01/03/2015 1636   ALT 25 01/07/2015 1805   ALT 26 01/03/2015 1636   ALKPHOS 62 01/07/2015 1805   ALKPHOS 67 01/03/2015 1636   BILITOT 0.7 01/07/2015 1805   GFRNONAA 71* 01/07/2015 1805   GFRAA 82* 01/07/2015 1805   Lab Results  Component Value Date   CHOL 230* 05/21/2014   HDL 35.50* 05/21/2014   LDLCALC 154* 05/21/2014   LDLDIRECT 181.1 04/13/2013   TRIG 201.0* 05/21/2014   CHOLHDL 6 05/21/2014   Lab Results  Component Value Date   HGBA1C 6.0 05/21/2014   No results found for: VITAMINB12 Lab Results  Component Value Date   TSH 1.43 04/13/2013     ASSESSMENT AND PLAN  Pamela Norman is a 74 y.o. female With gradual onset neck achiness, radiating paresthesia to bilateral upper and lower extremity,Lhermitte signs,Hyperreflexia on examination  1.  Most suggestive of spondylitic cervical myelopathy, radiculopathy 2, proceed with MRI of cervical 3. Return to clinic in 2 weeks   Marcial Pacas, M.D. Ph.D.  [facility] [address], [place], [place] [address] Ph: [phone] Fax: [phone]

## 2015-05-24 ENCOUNTER — Encounter: Payer: Self-pay | Admitting: Obstetrics and Gynecology

## 2015-05-24 ENCOUNTER — Ambulatory Visit (INDEPENDENT_AMBULATORY_CARE_PROVIDER_SITE_OTHER): Payer: Medicare Other | Admitting: Obstetrics and Gynecology

## 2015-05-24 VITALS — BP 150/82 | HR 74 | Ht 67.0 in | Wt 172.4 lb

## 2015-05-24 DIAGNOSIS — R202 Paresthesia of skin: Secondary | ICD-10-CM

## 2015-05-24 DIAGNOSIS — IMO0002 Reserved for concepts with insufficient information to code with codable children: Secondary | ICD-10-CM

## 2015-05-24 DIAGNOSIS — C801 Malignant (primary) neoplasm, unspecified: Secondary | ICD-10-CM | POA: Diagnosis not present

## 2015-05-25 ENCOUNTER — Encounter: Payer: Self-pay | Admitting: Obstetrics and Gynecology

## 2015-05-25 ENCOUNTER — Other Ambulatory Visit: Payer: Medicare Other

## 2015-05-25 ENCOUNTER — Ambulatory Visit (INDEPENDENT_AMBULATORY_CARE_PROVIDER_SITE_OTHER): Payer: Medicare Other | Admitting: Internal Medicine

## 2015-05-25 ENCOUNTER — Encounter: Payer: Self-pay | Admitting: Internal Medicine

## 2015-05-25 VITALS — BP 129/75 | HR 73 | Temp 97.8°F | Ht 66.0 in | Wt 173.1 lb

## 2015-05-25 DIAGNOSIS — R202 Paresthesia of skin: Secondary | ICD-10-CM

## 2015-05-25 DIAGNOSIS — C541 Malignant neoplasm of endometrium: Secondary | ICD-10-CM | POA: Diagnosis not present

## 2015-05-25 LAB — COMPREHENSIVE METABOLIC PANEL
ALT: 23 U/L (ref 0–35)
AST: 23 U/L (ref 0–37)
Albumin: 4.3 g/dL (ref 3.5–5.2)
Alkaline Phosphatase: 58 U/L (ref 39–117)
BILIRUBIN TOTAL: 0.3 mg/dL (ref 0.2–1.2)
BUN: 19 mg/dL (ref 6–23)
CO2: 30 meq/L (ref 19–32)
Calcium: 9.3 mg/dL (ref 8.4–10.5)
Chloride: 104 mEq/L (ref 96–112)
Creatinine, Ser: 0.86 mg/dL (ref 0.40–1.20)
GFR: 68.69 mL/min (ref >60.00)
GLUCOSE: 129 mg/dL — AB (ref 70–99)
Potassium: 4.6 mEq/L (ref 3.5–5.1)
Sodium: 139 mEq/L (ref 135–145)
Total Protein: 6.9 g/dL (ref 6.0–8.3)

## 2015-05-25 LAB — CBC WITH DIFFERENTIAL/PLATELET
BASOS ABS: 0 10*3/uL (ref 0.0–0.1)
Basophils Relative: 0.3 % (ref 0.0–3.0)
EOS PCT: 0.9 % (ref 0.0–5.0)
Eosinophils Absolute: 0.1 10*3/uL (ref 0.0–0.7)
HEMATOCRIT: 42.1 % (ref 36.0–46.0)
HEMOGLOBIN: 13.9 g/dL (ref 12.0–15.0)
LYMPHS ABS: 1.5 10*3/uL (ref 0.7–4.0)
LYMPHS PCT: 20.9 % (ref 12.0–46.0)
MCHC: 32.9 g/dL (ref 30.0–36.0)
MCV: 91.1 fl (ref 78.0–100.0)
MONOS PCT: 8.1 % (ref 3.0–12.0)
Monocytes Absolute: 0.6 10*3/uL (ref 0.1–1.0)
Neutro Abs: 4.9 10*3/uL (ref 1.4–7.7)
Neutrophils Relative %: 69.8 % (ref 43.0–77.0)
Platelets: 231 10*3/uL (ref 150.0–400.0)
RBC: 4.62 Mil/uL (ref 3.87–5.11)
RDW: 13.8 % (ref 11.5–15.5)
WBC: 7.1 10*3/uL (ref 4.0–10.5)

## 2015-05-25 LAB — VITAMIN B12: Vitamin B-12: 534 pg/mL (ref 211–911)

## 2015-05-25 LAB — TSH: TSH: 3.26 u[IU]/mL (ref 0.35–4.50)

## 2015-05-25 NOTE — Progress Notes (Signed)
Pre visit review using our clinic review tool, if applicable. No additional management support is needed unless otherwise documented below in the visit note. 

## 2015-05-25 NOTE — Assessment & Plan Note (Signed)
Recent paresthesia and disturbance of balance. Exam is normal today, except that she appears anxious and has some tangential speaking. Will check TSH, B12, CMP with labs. Await MRI cervical spine report and neurology followup. Consider evaluation with psychiatry if symptoms persistent.

## 2015-05-25 NOTE — Patient Instructions (Signed)
1. Normal anatomic findings confirmed. 2.  Recommend follow-up with Dr. Ronette Deter for further management planning. 3.  Return in 6 months for follow-up on endometrioid adenocarcinoma.

## 2015-05-25 NOTE — Progress Notes (Signed)
GYN ENCOUNTER NOTE  Subjective:       Pamela Norman is a 74 y.o. G0P0000 female is here for gynecologic evaluation of the following issues:  1.Body "mal alignment". 2.  Stage I endometrioid adenocarcinoma, status post robotic hysterectomy, BSO.  Pamela Norman presents today for the above complaints.  She is doing well post surgery for stage I endometrioid adenocarcinoma.  Bowel and bladder function are normal.  She is not experiencing any pelvic pain.  Chief complaint today is her "body malalignment".She has seen neurology for possible neurologic issues and she does report that a CT scan of the head was negative.  Whether there is some spinal compression on the nerve roots causing some paresthesias is a possibility.  This is being assessed by neurology. However, she has obsessive concerns about body symmetry which is "lost"?     Gynecologic History No LMP recorded. Patient has had a hysterectomy. Contraception: status post hysterectomy   Obstetric History OB History  Gravida Para Term Preterm AB SAB TAB Ectopic Multiple Living  0 0 0 0 0 0 0 0 0 0       Obstetric Comments  Age at first menarche-age 5-13    Past Medical History  Diagnosis Date  . Heart murmur   . Hyperlipidemia   . Squamous cell carcinoma     face  . Cataract   . Endometrial cancer     Grade 1  . History of colonoscopy 2014    within normal limits  . History of mammogram 2015  . History of CVA (cerebrovascular accident)   . Rectal prolapse   . Anxiety   . History of ovarian cyst 01/2015    Past Surgical History  Procedure Laterality Date  . Appendectomy    . Tonsilectomy, adenoidectomy, bilateral myringotomy and tubes    . Combined hysteroscopy diagnostic / d&c  2016  . Laparoscopic hysterectomy  03/30/15  . Bilateral salpingoophorectomy  03/30/15  . Dnc    . Dilation and curettage of uterus      Current Outpatient Prescriptions on File Prior to Visit  Medication Sig Dispense Refill  . diazepam  (VALIUM) 5 MG tablet Take 1 tablet (5 mg total) by mouth daily. (Patient taking differently: Take 5 mg by mouth daily as needed. ) 30 tablet 0   No current facility-administered medications on file prior to visit.    No Known Allergies  History   Social History  . Marital Status: Single    Spouse Name: N/A  . Number of Children: 0  . Years of Education: PhD   Occupational History  . Retired    Social History Main Topics  . Smoking status: Former Smoker    Quit date: 04/13/1981  . Smokeless tobacco: Never Used  . Alcohol Use: 1.2 oz/week    2 Glasses of wine per week     Comment: Occasional wine.  . Drug Use: No  . Sexual Activity: Not on file   Other Topics Concern  . Not on file   Social History Narrative   Lives in [place]. No children.      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      1-2 cups caffeine daily.    Family History  Problem Relation Age of Onset  . Colon cancer Father     older age onset  . COPD  Father   . COPD Mother     The following portions of the patient's history were reviewed and updated as appropriate: allergies, current medications, past family history, past medical history, past social history, past surgical history and problem list.  Review of Systems Review of Systems - General ROS: negative for - chills, fatigue, fever, hot flashes, malaise or night sweats Hematological and Lymphatic ROS: negative for - bleeding problems or swollen lymph nodes Gastrointestinal ROS: negative for - abdominal pain, blood in stools, change in bowel habits and nausea/vomiting Musculoskeletal ROS: negative for - joint pain, muscle pain or muscular weakness. POSITIVE-altered sensation of thorax and abdomen Genito-Urinary ROS: negative for -  dysuria, genital discharge, genital ulcers, hematuria, incontinence, irregular/heavy menses, nocturia or pelvic  pain.  Objective:   BP 150/82 mmHg  Pulse 74  Ht 5\' 7"  (1.702 m)  Wt 172 lb 7 oz (78.217 kg)  BMI 27.00 kg/m2 CONSTITUTIONAL: Well-developed, well-nourished female in no acute distress.  HENT:  Normocephalic, atraumatic.  NECK: Normal range of motion, supple, no masses.  Normal thyroid.  SKIN: Skin is warm and dry. No rash noted. Not diaphoretic. No erythema. No pallor. [place]: Alert and oriented to person, place, and time. PSYCHIATRIC: Normal mood and affect. Normal behavior. POSITIVE-abnormal judgment and thought content present CARDIOVASCULAR:Not Examined RESPIRATORY: Not Examined BREASTS: Not Examined ABDOMEN: Soft, non distended; Non tender.  No Organomegaly.Well-healed laparoscopy incisions; no hernias PELVIC:  External Genitalia: Normal  BUS: Normal  Vagina: Normal; Good vault support; Introitus narrowed, Pediatric speculum utilized  Cervix: Surgically absent  Uterus: Surgically absent  Bimanual: No palpable masses; cuff intact  Adnexa: Normal  RV: Normal External exam  Bladder: Nontender MUSCULOSKELETAL: Normal range of motion. No tenderness.  No cyanosis, clubbing, or edema. Normal gait    Assessment:   1.  Stage I endometrioid adenocarcinoma, status post TAH/BSO, and no evidence of disease. 2.  Paresthesias, uncertain etiology, neurology workup ongoing with reportedly negative head CT. 3.  Normal PE; No "Assymetry" 4.  Physical complaints, not confirmed by clinical exam.  Psychologic issues need to be addressed.  Psychiatry referral likely will be needed.  Defer this recommendation to her primary care physician.   A total of 25 minutes were spent face-to-face with the patient during this encounter and over half of that time involved counseling and coordination of care.         Plan:   1. Normal anatomic findings confirmed. 2.  Recommend follow-up with Dr. Ronette Deter for further management planning. 3.  Return in 6 months for follow-up on endometrioid  adenocarcinoma.

## 2015-05-25 NOTE — Progress Notes (Signed)
Subjective:    Patient ID: Pamela Norman, female    DOB: 1941-10-17, 74 y.o.   MRN: [id number]  HPI  74YO female presents for followup.  Recently seen by both neurology and GYN for pain and body "asymmetry."  Workup including MRI of cervical spine pending.  Concerned about gradual worsening of balance. Also having some decreased sensation in legs. No falls. Seen by neurology yesterday. MRI scheduled for Saturday. Planning to follow up in 2 weeks.  Seen by GYN for vaginal bleeding. Completed D and C, found endometrial adenocarcinoma. Had hysterectomy with GYNONC. Recovered well. No metastatic disease.   Past medical, surgical, family and social history per today's encounter.  Review of Systems  Constitutional: Negative for fever, chills, appetite change, fatigue and unexpected weight change.  Eyes: Negative for visual disturbance.  Respiratory: Negative for shortness of breath.   Cardiovascular: Negative for chest pain and leg swelling.  Gastrointestinal: Negative for abdominal pain.  Musculoskeletal: Positive for gait problem. Negative for myalgias and arthralgias.  Skin: Negative for color change and rash.  Neurological: Positive for weakness and numbness. Negative for dizziness, tremors, syncope, light-headedness and headaches.  Hematological: Negative for adenopathy. Does not bruise/bleed easily.  Psychiatric/Behavioral: Negative for sleep disturbance and dysphoric mood. The patient is not nervous/anxious.        Objective:    BP 129/75 mmHg  Pulse 73  Temp(Src) 97.8 F (36.6 C) (Oral)  Ht 5\' 6"  (1.676 m)  Wt 173 lb 2 oz (78.529 kg)  BMI 27.96 kg/m2  SpO2 100% Physical Exam  Constitutional: She is oriented to person, place, and time. She appears well-developed and well-nourished. No distress.  HENT:  Head: Normocephalic and atraumatic.  Right Ear: External ear normal.  Left Ear: External ear normal.  Nose: Nose normal.  Mouth/Throat: Oropharynx is clear and  moist. No oropharyngeal exudate.  Eyes: Conjunctivae are normal. Pupils are equal, round, and reactive to light. Right eye exhibits no discharge. Left eye exhibits no discharge. No scleral icterus.  Neck: Normal range of motion. Neck supple. No tracheal deviation present. No thyromegaly present.  Cardiovascular: Normal rate, regular rhythm, normal heart sounds and intact distal pulses.  Exam reveals no gallop and no friction rub.   No murmur heard. Pulmonary/Chest: Effort normal and breath sounds normal. No respiratory distress. She has no wheezes. She has no rales. She exhibits no tenderness.  Musculoskeletal: Normal range of motion. She exhibits no edema or tenderness.  Lymphadenopathy:    She has no cervical adenopathy.  Neurological: She is alert and oriented to person, place, and time. No cranial nerve deficit. She exhibits normal muscle tone. Coordination normal.  Skin: Skin is warm and dry. No rash noted. She is not diaphoretic. No erythema. No pallor.  Psychiatric: Her behavior is normal. Judgment and thought content normal. Her mood appears anxious. Her speech is rapid and/or pressured. Cognition and memory are normal.          Assessment & Plan:   Problem List Items Addressed This Visit      Unprioritized   Endometrial cancer - Primary    Reviewed notes from GYN. S/p hysterectomy.      Paresthesias    Recent paresthesia and disturbance of balance. Exam is normal today, except that she appears anxious and has some tangential speaking. Will check TSH, B12, CMP with labs. Await MRI cervical spine report and neurology followup. Consider evaluation with psychiatry if symptoms persistent.      Relevant Orders   TSH  CBC with Differential/Platelet   Comprehensive metabolic panel   U98       Return in about 4 weeks (around 06/22/2015) for Recheck.

## 2015-05-25 NOTE — Patient Instructions (Signed)
Labs today.  Follow up in 4 weeks. 

## 2015-05-25 NOTE — Assessment & Plan Note (Signed)
Reviewed notes from GYN. S/p hysterectomy.

## 2015-05-26 ENCOUNTER — Telehealth: Payer: Self-pay | Admitting: *Deleted

## 2015-05-26 ENCOUNTER — Emergency Department
Admission: EM | Admit: 2015-05-26 | Discharge: 2015-05-26 | Disposition: A | Discharge status: Home / Self Care | Payer: Medicare Other | Attending: Emergency Medicine | Admitting: Emergency Medicine

## 2015-05-26 ENCOUNTER — Telehealth: Payer: Self-pay | Admitting: Internal Medicine

## 2015-05-26 ENCOUNTER — Other Ambulatory Visit (INDEPENDENT_AMBULATORY_CARE_PROVIDER_SITE_OTHER): Payer: Medicare Other

## 2015-05-26 ENCOUNTER — Encounter: Payer: Self-pay | Admitting: Emergency Medicine

## 2015-05-26 DIAGNOSIS — F449 Dissociative and conversion disorder, unspecified: Secondary | ICD-10-CM | POA: Diagnosis not present

## 2015-05-26 DIAGNOSIS — R7309 Other abnormal glucose: Secondary | ICD-10-CM | POA: Diagnosis not present

## 2015-05-26 DIAGNOSIS — R202 Paresthesia of skin: Secondary | ICD-10-CM | POA: Insufficient documentation

## 2015-05-26 DIAGNOSIS — F419 Anxiety disorder, unspecified: Secondary | ICD-10-CM | POA: Diagnosis not present

## 2015-05-26 DIAGNOSIS — R7303 Prediabetes: Secondary | ICD-10-CM

## 2015-05-26 DIAGNOSIS — Z87891 Personal history of nicotine dependence: Secondary | ICD-10-CM | POA: Insufficient documentation

## 2015-05-26 DIAGNOSIS — R209 Unspecified disturbances of skin sensation: Secondary | ICD-10-CM | POA: Diagnosis not present

## 2015-05-26 LAB — HEMOGLOBIN A1C: Hgb A1c MFr Bld: 6.1 % (ref 4.6–6.5)

## 2015-05-26 NOTE — Telephone Encounter (Signed)
Reviewed record, MRI of cervical is scheduled for May 28 2015, she should have a follow-up with me instead of with [place] afterwards.

## 2015-05-26 NOTE — Telephone Encounter (Signed)
Spoke to [place] - says her pain is worsening and she is on the way to [facility].  She ask that I let you know.

## 2015-05-26 NOTE — Telephone Encounter (Signed)
Patient Name: EMERIE VANDERKOLK  DOB: 09-03-1941    Initial Comment Caller states she feels like her body is twisting, especially pulling in neck,    Nurse Assessment  Nurse: Raphael Gibney, RN, [facility] Date/Time (Eastern Time): 05/26/2015 1:44:39 PM  Confirm and document reason for call. If symptomatic, describe symptoms. ---Caller states she is trying to get in touch with her doctor. She has been to the ER in the past. She says her body has a mind of its own and does not want to talk to me.  Has the patient traveled out of the country within the last 30 days? ---Not Applicable  Does the patient require triage? ---Declined Triage  Please document clinical information provided and list any resource used. ---Warm transferred to the back line at the office.     Guidelines    Guideline Title Affirmed Question Affirmed Notes       Final Disposition User   Clinical Call Buckhead, RN, Vanita Ingles

## 2015-05-26 NOTE — Telephone Encounter (Signed)
Appointment moved to Dr. Rhea Belton schedule.

## 2015-05-26 NOTE — Consult Note (Signed)
[facility] Consult   Reason for Consult:  Consult for this 74 year old woman with an unexplained set of physical symptoms who presents as being extremely anxious Referring Physician:  Reita Cliche Patient Identification: Pamela Norman MRN:  [id number] Principal Diagnosis: Conversion disorder Diagnosis:   Patient Active Problem List   Diagnosis Date Noted  . Conversion disorder [F44.9] 05/26/2015  . Paresthesias [R20.2] 05/25/2015  . Cervicalgia [M54.2] 05/11/2015  . Endometrial cancer [C54.1] 05/04/2015  . Other and unspecified hyperlipidemia [E78.5] 05/21/2014  . Elevated blood sugar [R73.09] 05/21/2014  . Medicare annual wellness visit, subsequent [Z00.00] 05/19/2013  . Screening for breast cancer [Z12.39] 04/13/2013  . Special screening for malignant neoplasms, colon [Z12.11] 04/13/2013    Total Time spent with patient: 1 hour  Subjective:   Pamela Norman is a 74 y.o. female patient admitted with "sir, it is physical" patient is insistent on her physical complaint.  HPI:  Information obtained from the patient and the chart. Reviewed multiple old notes in her chart. Case discussed with emergency room physician. Patient is complaining of a physical condition that is a little hard to describe. She describes it as the nerves in her body are running up and down her body and are pulling on her. She uses a lot of physical gestures to try and explain it. It is not primarily of pain although she does say she has a bit of a backache. It doesn't appear to get better or worse with any particular motion. Apparently she came to the emergency room today because she is feeling worse with this symptom than she has ever felt before. She told her best friend that she felt like she was smothering. The patient will not admit having said that to me but the friend is very clear that that was what brought them to the emergency room. The patient denies having any current trouble with breathing. She  states that this sensation wakes her up frequently at night. It has been consistent. Looking back in her old records she has visited the emergency room and her primary care doctor several times going back at least to January with this kind of complaint. So far  nothing has been found definitive. She has seen a neurologist who has ordered an MRI of the cervical spine which has not been done yet. She is not taking any medication or having any particular treatment for this. The patient refuses to admit that she is feeling nervous or anxious. Denies being depressed. Denies any thoughts of suicide.  There is no past psychiatric history. I evaluated this patient in the emergency room in January for similar somatic complaints. No history of psychiatric medication or hospitalization. No history of suicide attempts.  Medically the patient had a diagnosis of endometrial cancer during the spring of this year. She has had a hysterectomy. She says that now she is asymptomatic. Other than that she is in quite good health.  Social history: Patient lives alone. She is a retired Arts administrator. She also served in the [employer]. Her closest relatives are her brother who is here today with her. She has a close friend who lives nearby as well.  Family history: No known family history of mental illness  Substance abuse history: Denies drinking denies any history of substance abuse HPI Elements:   Quality:  Vague but troubling somatic complaints. Severity:  Described as extremely severe. Timing:  Worse today than ever before. Duration:  Going on for several months. Context:  1 known  stress is her cancer diagnosis and treatment..  Past Medical History:  Past Medical History  Diagnosis Date  . Heart murmur   . Hyperlipidemia   . Squamous cell carcinoma     face  . Cataract   . Endometrial cancer     Grade 1  . History of colonoscopy 2014    within normal limits  . History of mammogram 2015  .  History of CVA (cerebrovascular accident)   . Rectal prolapse   . Anxiety   . History of ovarian cyst 01/2015    Past Surgical History  Procedure Laterality Date  . Appendectomy    . Tonsilectomy, adenoidectomy, bilateral myringotomy and tubes    . Combined hysteroscopy diagnostic / d&c  2016  . Laparoscopic hysterectomy  03/30/15  . Bilateral salpingoophorectomy  03/30/15  . Dnc    . Dilation and curettage of uterus     Family History:  Family History  Problem Relation Age of Onset  . Colon cancer Father     older age onset  . COPD Father   . COPD Mother    Social History:  History  Alcohol Use  . 1.2 oz/week  . 2 Glasses of wine per week    Comment: Occasional wine.     History  Drug Use No    History   Social History  . Marital Status: Single    Spouse Name: N/A  . Number of Children: 0  . Years of Education: PhD   Occupational History  . Retired    Social History Main Topics  . Smoking status: Former Smoker    Quit date: 04/13/1981  . Smokeless tobacco: Never Used  . Alcohol Use: 1.2 oz/week    2 Glasses of wine per week     Comment: Occasional wine.  . Drug Use: No  . Sexual Activity: Not on file   Other Topics Concern  . None   Social History Narrative   Lives in [place]. No children.      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      1-2 cups caffeine daily.   Additional Social History:                          Allergies:  No Known Allergies  Labs:  Results for orders placed or performed in visit on 05/26/15 (from the past 48 hour(s))  Hemoglobin A1c     Status: None   Collection Time: 05/26/15  8:03 AM  Result Value Ref Range   Hgb A1c MFr Bld 6.1 4.6 - 6.5 %    Comment: Glycemic Control Guidelines for People with Diabetes:Non Diabetic:  <6%Goal of Therapy: <7%Additional Action Suggested:  >8%     Vitals: Blood pressure  153/100, pulse 89, temperature 97.9 F (36.6 C), temperature source Oral, resp. rate 20, height 5\' 6"  (1.676 m), weight 78.019 kg (172 lb), SpO2 96 %.  Risk to Self: Is patient at risk for suicide?: No Risk to Others:   Prior Inpatient Therapy:   Prior Outpatient Therapy:    No current facility-administered medications for this encounter.   Current Outpatient Prescriptions  Medication Sig Dispense Refill  . diazepam (VALIUM) 5 MG tablet Take 1 tablet (5 mg total) by mouth daily. (Patient taking differently: Take 5 mg by mouth daily as  needed. ) 30 tablet 0    Musculoskeletal: Strength & Muscle Tone: within normal limits Gait & Station: normal Patient leans: N/A  Psychiatric Specialty Exam: Physical Exam  Constitutional: She appears well-developed and well-nourished.  HENT:  Head: Normocephalic and atraumatic.  Eyes: Conjunctivae are normal. Pupils are equal, round, and reactive to light.  Neck: Normal range of motion.  Cardiovascular: Normal heart sounds.   Respiratory: Effort normal.  GI: Soft.  Musculoskeletal: Normal range of motion.  Neurological: She is alert.  Skin: Skin is warm and dry.  Psychiatric: Her mood appears anxious. Her speech is rapid and/or pressured. She is agitated. Thought content is delusional. Cognition and memory are normal. She expresses inappropriate judgment.  The patient presents as being extraordinarily anxious. She is pacing throughout the interview. Her speech is very perseverative. Everything returns to the topic of her medical symptom. She refuses to even consider the possibility that she could be nervous. She is almost delusional in the degree to which she refuses to consider any alternative explanation for her condition.    Review of Systems  HENT: Negative.   Eyes: Negative.   Respiratory: Positive for shortness of breath.   Cardiovascular: Negative.   Gastrointestinal: Negative.   Musculoskeletal: Positive for back pain.  Skin:  Negative.   Neurological: Positive for sensory change and weakness.  Psychiatric/Behavioral: Negative for depression, suicidal ideas, hallucinations, memory loss and substance abuse. The patient has insomnia. The patient is not nervous/anxious.     Blood pressure 153/100, pulse 89, temperature 97.9 F (36.6 C), temperature source Oral, resp. rate 20, height 5\' 6"  (1.676 m), weight 78.019 kg (172 lb), SpO2 96 %.Body mass index is 27.77 kg/(m^2).  General Appearance: Casual and Well Groomed  Eye Contact::  Good  Speech:  Pressured  Volume:  Increased  Mood:  Anxious  Affect:  Extremely anxious  Thought Process:  Circumstantial  Orientation:  Full (Time, Place, and Person)  Thought Content:  Negative  Suicidal Thoughts:  No  Homicidal Thoughts:  No  Memory:  Immediate;   Good Recent;   Good Remote;   Good  Judgement:  Impaired  Insight:  Lacking  Psychomotor Activity:  Restlessness  Concentration:  Fair  Recall:  [employer] of Knowledge:Good  Language: Good  Akathisia:  No  Handed:  Right  AIMS (if indicated):     Assets:  Communication Skills Desire for Improvement Housing Physical Health Social Support  ADL's:  Intact  Cognition: WNL  Sleep:      Medical Decision Making: New problem, with additional work up planned, Review of Psycho-Social Stressors (1), Review or order clinical lab tests (1), Discuss test with performing physician (1) and Review and summation of old records (2)  Treatment Plan Summary: Plan This is a 74 year old woman who presents with very strongly stated complaints of a very vague set of physical symptoms. There is nothing about them that sounds typical of any kind of physiologic illness. So far no workup has found anything that would explain her symptoms. She is getting an MRI of her neck this weekend and will follow-up with a neurologist but from the description she is giving today it does not sound to me very much like a cervical stenosis. What is  remarkable is that she is extraordinarily anxious, perseverative, and agitated but refuses to acknowledge any of this. He will not even acknowledge that the condition itself makes her anxious. After reviewing her history and the records I think the best diagnosis really is  conversion disorder. This is a physical symptom that is not explained physiologically and is causing extreme distress. I think it is clear that there are psychological factors involved with this. Patient has 0 insight and is absolutely refusing to engage in any psychological analysis of her condition or herself. She absolutely denies suicidal ideation. She does not have a past history of suicide attempts and does not present herself as being hopeless. She appears to be taking care of herself and her usual daily routine adequately although she has cut back on her usual social and physical activity. There is no grounds for filing involuntary commitment. I spent some time trying to convince her or may be a psychological component but she was having none of it. Case discussed with Dr. Reita Cliche. Would not suggest adding any medication at this point. Fortunately the patient will continue to follow-up with Dr. Gilford Rile who is her primary care doctor.  Plan:  No evidence of imminent risk to self or others at present.   Patient does not meet criteria for psychiatric inpatient admission. Supportive therapy provided about ongoing stressors. Disposition: No involuntary commitment. Patient can be released from the emergency room. Would not recommend any specific medication  Alethia Berthold 05/26/2015 6:32 PM

## 2015-05-26 NOTE — Telephone Encounter (Signed)
Called transferred in via [facility] due to pt would not give her any information on what is going on with her. Pt advised that her body is twisted around and it goes around her neck and across her head. Pt asked several times if she is having any pain or had a fall. Pt stated that she just needs someone to back her up if she goes to the ER that something is wrong if her. Spoke with Dr. Gilford Rile who advised that pt was seen for a visit on 05/25/15 and has had some balance issues and if pt is having weakness that she needs to go to the ER. Pt advised of Dr. Thomes Dinning statement and pt stated that she would go to the ER.

## 2015-05-26 NOTE — ED Notes (Signed)
Pt is rambling stating that she is bad off. She feels that something is pulling her down by her neck and pulling her in different directions. She reports that she is seeing a neurologist and to have a MRI on Monday. She keeps saying that she cant strenghten up, but is able to walk sit up in chair. She stood up in triage room without difficulty. She went to her PMD and they went checked her B12 level and it was fine. She keeps standing up and sitting down.

## 2015-05-26 NOTE — ED Provider Notes (Signed)
[facility] Emergency Department Provider Note   ____________________________________________  Time seen: 4:30 PM I have reviewed the triage vital signs and the triage nursing note.  HISTORY  Chief Complaint Spasms   Historian Patient provides history, but is somewhat limited as she is a poor historian Friend provides some history, as well as brother who is in town from [facility]  HPI Pamela Norman is a 74 y.o. female who is coming in for evaluation tonight to the emergency department because she wants help finding out what's "wrong.". Her friend reports that the patient called her extremely worried and stated that the patient felt like she was "smothering". The patient states that she feels like she is "out of alignment." She tells me she is having no trouble breathing, and no chest pain, but that she has a sensation that she is off balance from the middle of her stomach and points to this area and then crosses her arms across her pelvis back and forth and then bends down to touch her toes and then brings her hand the tip of her head and states that she feels like her body is "twisting around and around." Her friend and brother report to me outside the room that although she is very intelligent, she doesn't seem to be making sense.  Patient reports she's had 70 symptoms for possibly one year, the family says she's had it for several months, but seems to be worsening in her up session. They feel like it's more distressing to the patient and have her. They're concerned that she shouldn't be living alone. The friend tells me that at one point time the patient stated that someone ought to just shoot her to put her out of her agony. The friend states that she asked if there were any guns in the home, and the patient said no. The patient tells me "the only thing going right is my mind." She has been recently to a OB/GYN follow-up for an endometrial cancer which was  previously resected. She has recently in referred by her primary care physician to a neurologist who saw her recently and has her scheduled for a C-spine MRI soon.    Past Medical History  Diagnosis Date  . Heart murmur   . Hyperlipidemia   . Squamous cell carcinoma     face  . Cataract   . Endometrial cancer     Grade 1  . History of colonoscopy 2014    within normal limits  . History of mammogram 2015  . History of CVA (cerebrovascular accident)   . Rectal prolapse   . Anxiety   . History of ovarian cyst 01/2015    Patient Active Problem List   Diagnosis Date Noted  . Conversion disorder 05/26/2015  . Paresthesias 05/25/2015  . Cervicalgia 05/11/2015  . Endometrial cancer 05/04/2015  . Other and unspecified hyperlipidemia 05/21/2014  . Elevated blood sugar 05/21/2014  . Medicare annual wellness visit, subsequent 05/19/2013  . Screening for breast cancer 04/13/2013  . Special screening for malignant neoplasms, colon 04/13/2013    Past Surgical History  Procedure Laterality Date  . Appendectomy    . Tonsilectomy, adenoidectomy, bilateral myringotomy and tubes    . Combined hysteroscopy diagnostic / d&c  2016  . Laparoscopic hysterectomy  03/30/15  . Bilateral salpingoophorectomy  03/30/15  . Dnc    . Dilation and curettage of uterus      Current Outpatient Rx  Name  Route  Sig  Dispense  Refill  . diazepam (VALIUM) 5 MG tablet   Oral   Take 1 tablet (5 mg total) by mouth daily. Patient taking differently: Take 5 mg by mouth daily as needed.    30 tablet   0     Allergies Review of patient's allergies indicates no known allergies.  Family History  Problem Relation Age of Onset  . Colon cancer Father     older age onset  . COPD Father   . COPD Mother     Social History History  Substance Use Topics  . Smoking status: Former Smoker    Quit date: 04/13/1981  . Smokeless tobacco: Never Used  . Alcohol Use: 1.2 oz/week    2 Glasses of wine per week      Comment: Occasional wine.    Review of Systems  Constitutional: Negative for fever. Eyes: Negative for visual changes. ENT: Negative for sore throat. Cardiovascular: Negative for chest pain. Respiratory: Negative for shortness of breath. Gastrointestinal: Negative for abdominal pain, vomiting and diarrhea. Genitourinary: Negative for dysuria. Musculoskeletal: Negative for back pain. Skin: Negative for rash. Neurological: Negative for headaches, focal weakness or numbness.  ____________________________________________   PHYSICAL EXAM:  VITAL SIGNS: ED Triage Vitals  Enc Vitals Group     BP 05/26/15 1500 153/100 mmHg     Pulse Rate 05/26/15 1500 89     Resp 05/26/15 1500 20     Temp 05/26/15 1500 97.9 F (36.6 C)     Temp Source 05/26/15 1500 Oral     SpO2 05/26/15 1500 96 %     Weight 05/26/15 1500 172 lb (78.019 kg)     Height 05/26/15 1500 5\' 6"  (1.676 m)     Head Cir --      Peak Flow --      Pain Score 05/26/15 1604 8     Pain Loc --      Pain Edu? --      Excl. in [place]? --      Constitutional: Alert and oriented. Standing up pacing around the room.  Eyes: Conjunctivae are normal. PERRL. Normal extraocular movements. ENT   Head: Normocephalic and atraumatic.   Nose: No congestion/rhinnorhea.   Mouth/Throat: Mucous membranes are moist.   Neck: No stridor. Cardiovascular: Normal rate, regular rhythm.  No murmurs, rubs, or gallops. Respiratory: Normal respiratory effort without tachypnea nor retractions. Breath sounds are clear and equal bilaterally. No wheezes/rales/rhonchi. Gastrointestinal: Soft. No distention, no guarding, no rebound.Nontender  Genitourinary/rectal:Deferred  Musculoskeletal: Nontender with normal range of motion in all extremities.  Neurologic: No slurred speech.  Normal gait. Strength and sensation are intact in 4 extremities. Alert and oriented 4.  Skin:  Skin is warm, dry and intact. No rash noted. Psychiatric: Repetitive  thoughts regarding feeling of being out of alignment and feeling twisted. She does have some tangential thoughts. It is very difficult to follow her train of thought. She does not appear anxious, and has no evidence of depressed mood. She has no suicidal ideation.  she is pacing around the room. She seems somewhat paranoid, asking me over and over again if I'm willing to listen and if unable to tell anyone what she tells me. Seems to have poor insight.  ____________________________________________   EKG I, Lisa Roca, MD, the attending physician have personally viewed and interpreted all ECGs.  None  ____________________________________________  LABS (pertinent positives/negatives)  None  ____________________________________________  RADIOLOGY Imaging viewed by me, and interpreted by Radiologist   None  __________________________________________  PROCEDURES  Procedure(s) performed: None Critical Care performed: None  ____________________________________________   ED COURSE / ASSESSMENT AND PLAN  Pertinent labs & imaging results that were available during my care of the patient were reviewed by me and considered in my medical decision making (see chart for details).   Patient was recently seen several days ago for similar complaint of this sensation all over her body of being twisted. There is no evidence of focal neurologic abnormality, nor focal muscle spasm, or muscle or joint, or abdominal pain. I am most struck by her abnormal train of thought and I'm concerned about underlying psychiatric etiology. The onset does seem somewhat insidious. I'm not sure she has an indication for an emergency involuntary commitment, although her friend and brother are very insistent that they're concerned about sending her home alone that she is unsafe. Don't give an exact reason why they think she is unsafe just that she seems to be coming more and more obsessed with these symptoms. She is  following with a neurologist, and was able to review the note where she does have a C-spine MRI scheduled shortly. From my perspective I don't suspect that any cervical radiculopathy is the source of her tangential thought process, and sensation across her entire body.  I discussed the case with the on-call psychiatrist Dr. Weber Cooks, who will come see the patient in the emergency department to help with evaluation and for plan. When I approach the subject of having a psychiatrist evaluate her, the patient got extremely agitated and wanted to leave the emergency department. She was redirected by her family members until Dr. Weber Cooks came in to evaluate her.  Dr. Weber Cooks was able to talk with patient and family, and feels like she does have a significant component of anxiety, and the patient she does already have a prescription for as needed Valium. There is no indication for involuntary commitment, and Dr. Weber Cooks is concerned about her ability to have insight into the mental component of these symptoms. He suspects a trial of an antianxiety, oriented to present, or anti-psychotic may be helpful, as needed or prescribed by her primary care physician, or outpatient psychiatrist. I discussed this plan with the patient, her friend, and her brother. They're comfortable with outpatient follow-up plan. __________________________________________   FINAL CLINICAL IMPRESSION(S) / ED DIAGNOSES   Final diagnoses:  Anxiety  Paresthesias      Lisa Roca, MD 05/26/15 1843

## 2015-05-26 NOTE — Discharge Instructions (Signed)
You were evaluated for ongoing feeling of "twisting" and no medical emergency cause is suspected or found in the emergency department. You are also evaluated by Dr. Weber Cooks, a psychiatrist, who felt like he had a significant amount of anxiety. You should follow up with your primary care doctor, and your neurologist as scheduled. It's also recommended that you  follow-up with a psychiatrist. Return to the emergency department for any new or worsening condition including any trouble breathing, fever, one-sided weakness or numbness, or any other symptoms concerning to you.

## 2015-05-28 ENCOUNTER — Ambulatory Visit
Admission: RE | Admit: 2015-05-28 | Discharge: 2015-05-28 | Disposition: A | Discharge status: Home / Self Care | Payer: Medicare Other | Source: Ambulatory Visit | Attending: Neurology | Admitting: Neurology

## 2015-05-28 DIAGNOSIS — M2578 Osteophyte, vertebrae: Secondary | ICD-10-CM | POA: Diagnosis not present

## 2015-05-28 DIAGNOSIS — R202 Paresthesia of skin: Secondary | ICD-10-CM | POA: Diagnosis present

## 2015-05-28 DIAGNOSIS — M47892 Other spondylosis, cervical region: Secondary | ICD-10-CM | POA: Insufficient documentation

## 2015-05-28 DIAGNOSIS — M5382 Other specified dorsopathies, cervical region: Secondary | ICD-10-CM | POA: Diagnosis not present

## 2015-05-28 DIAGNOSIS — G9589 Other specified diseases of spinal cord: Secondary | ICD-10-CM | POA: Insufficient documentation

## 2015-05-28 DIAGNOSIS — R269 Unspecified abnormalities of gait and mobility: Secondary | ICD-10-CM

## 2015-05-28 DIAGNOSIS — M5032 Other cervical disc degeneration, mid-cervical region: Secondary | ICD-10-CM | POA: Diagnosis not present

## 2015-05-28 DIAGNOSIS — M4802 Spinal stenosis, cervical region: Secondary | ICD-10-CM | POA: Insufficient documentation

## 2015-05-30 ENCOUNTER — Telehealth: Payer: Self-pay | Admitting: Neurology

## 2015-05-30 NOTE — Telephone Encounter (Signed)
Michelle: Please call patient, MRI of the cervical showed multiple level degenerative disc disease, most severe at C5-6, I will review film with her in detail at her next follow-up visit  Degenerative change most notable at C5-6 where there is slight deformity of the ventral cord by a disc osteophyte complex and mild to moderate foraminal narrowing due to uncovertebral disease.

## 2015-05-31 NOTE — Telephone Encounter (Signed)
Patient called and requested results from her MRI. Please call and advise.

## 2015-05-31 NOTE — Telephone Encounter (Signed)
She is aware of results and an earlier appt has been scheduled.

## 2015-06-01 ENCOUNTER — Ambulatory Visit: Payer: Medicare Other

## 2015-06-01 ENCOUNTER — Telehealth: Payer: Self-pay | Admitting: *Deleted

## 2015-06-01 ENCOUNTER — Ambulatory Visit (INDEPENDENT_AMBULATORY_CARE_PROVIDER_SITE_OTHER): Payer: Medicare Other | Admitting: Neurology

## 2015-06-01 ENCOUNTER — Encounter: Payer: Self-pay | Admitting: Neurology

## 2015-06-01 VITALS — BP 102/72 | HR 74 | Ht 66.0 in | Wt 172.0 lb

## 2015-06-01 DIAGNOSIS — R202 Paresthesia of skin: Secondary | ICD-10-CM

## 2015-06-01 DIAGNOSIS — R269 Unspecified abnormalities of gait and mobility: Secondary | ICD-10-CM

## 2015-06-01 DIAGNOSIS — M4712 Other spondylosis with myelopathy, cervical region: Secondary | ICD-10-CM

## 2015-06-01 MED ORDER — DULOXETINE HCL 30 MG PO CPEP: 60.0000 mg | ORAL_CAPSULE | Freq: Every day | ORAL | Status: DC

## 2015-06-01 NOTE — Progress Notes (Signed)
Chief Complaint  Patient presents with  . Numbness    "Pamela Norman" is here with her brother to discuss her MRI results.      PATIENT: Pamela Norman DOB: March 17, 1941  Chief Complaint  Patient presents with  . Numbness    "Pamela Norman" is here with her brother to discuss her MRI results.    HISTORICAL ( Initial visit May 23 2015)  Pamela Norman is a 74 years old right-handed female, of accompanied by her friend Shauna Hugh, referred by her primary care physician Dr. Ronette Deter for evaluation of upper and lower extremity weakness   I have reviewed refer, ,She had total hysterectomy in April 2016 for endometrial cancer, stage I, is planning on to have potential low-dose radiation therapy, she retired from [employer] very active all her life  Since 2015, she noticed gradual onset shoulder neck achiness, also radiating paresthesia from her neck to bilateral upper extremity bilateral hands, she also noticed mild unbalanced gait, "as is my body is out of alignment".  She is very bothered by her symptoms, actually presented to emergency room in May 22 2015, I have  Personally reviewed CT head without contrast that was normal  Laboratory evaluations mild elevated WBC 13 point 8, otherwise normal CBC, CMP.  Her symptoms are gradually getting worse, she also described traveling discomfort along her spine, suggestive of Lhermitte sign, she denies falling episodes, no bowel and bladder incontinence, no bulbar weakness, no visual change.  UPDATE June 01 2015: She is with brother Herbie [place] At today's clinical visit, she is apparently very bothered by her symptoms, but she is a poor historian, she actually presented to the emergency room May 26 2015, for her discomfort  It is hard to elicit history from patient, sounds like she complains of worsening neck pain, radiating pain to bilateral shoulder, arm, mild unsteady gait, no bowel and bladder incontinence,  We have reviewed MRI of the cervical spine  together,in May 28 2015:Degenerative change most notable at C5-6 where there is slight deformity of the ventral cord by a disc osteophyte complex and mild to moderate foraminal narrowing due to uncovertebral disease.  She does have hyperreflexia in both upper and lower extremities,   REVIEW OF SYSTEMS: Full 14 system review of systems performed and notable only for as above  ALLERGIES: No Known Allergies  HOME MEDICATIONS: Current Outpatient Prescriptions  Medication Sig Dispense Refill  . diazepam (VALIUM) 5 MG tablet Take 1 tablet (5 mg total) by mouth daily. 30 tablet 0     PAST MEDICAL HISTORY: Past Medical History  Diagnosis Date  . Heart murmur   . Hyperlipidemia   . Squamous cell carcinoma     face  . Cataract   . Endometrial cancer     Grade 1  . History of colonoscopy 2014    within normal limits  . History of mammogram 2015  . History of CVA (cerebrovascular accident)   . Rectal prolapse   . Anxiety   . History of ovarian cyst 01/2015    PAST SURGICAL HISTORY: Past Surgical History  Procedure Laterality Date  . Appendectomy    . Tonsilectomy, adenoidectomy, bilateral myringotomy and tubes    . Combined hysteroscopy diagnostic / d&c  2016  . Laparoscopic hysterectomy  03/30/15  . Bilateral salpingoophorectomy  03/30/15  . Dnc    . Dilation and curettage of uterus      FAMILY HISTORY: Family History  Problem Relation Age of Onset  . Colon cancer Father  older age onset  . COPD Father   . COPD Mother     SOCIAL HISTORY:  History   Social History  . Marital Status: Single    Spouse Name: N/A  . Number of Children: 0  . Years of Education: PhD   Occupational History  . Retired    Social History Main Topics  . Smoking status: Former Smoker    Quit date: 04/13/1981  . Smokeless tobacco: Never Used  . Alcohol Use: 1.2 oz/week    2 Glasses of wine per week     Comment: Occasional wine.  . Drug Use: No  . Sexual Activity: Not on file    Other Topics Concern  . Not on file   Social History Narrative   Lives in [place]. No children.      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      1-2 cups caffeine daily.    PHYSICAL EXAM   Filed Vitals:   06/01/15 1455  BP: 102/72  Pulse: 74  Height: 5\' 6"  (1.676 m)  Weight: 172 lb (78.019 kg)    Not recorded      Body mass index is 27.77 kg/(m^2).  PHYSICAL EXAMNIATION:  Gen: NAD, conversant, well nourised, obese, well groomed                     Cardiovascular: Regular rate rhythm, no peripheral edema, warm, nontender. Eyes: Conjunctivae clear without exudates or hemorrhage Neck: Supple, no carotid bruise. Pulmonary: Clear to auscultation bilaterally   NEUROLOGICAL EXAM:  MENTAL STATUS: Speech:    Speech is normal; fluent and spontaneous with normal comprehension.  Cognition:    The patient is oriented to person, place, and time;     recent and remote memory intact;     language fluent;     normal attention, concentration,     fund of knowledge.  CRANIAL NERVES: CN II: Visual fields are full to confrontation. Fundoscopic exam is normal with sharp discs and no vascular changes.Pupil were equal round reactive to light CN III, IV, VI: extraocular movement are normal. No ptosis. CN V: Facial sensation is intact to pinprick in all 3 divisions bilaterally. Corneal responses are intact.  CN VII: Face is symmetric with normal eye closure and smile. CN VIII: Hearing is normal to rubbing fingers CN IX, X: Palate elevates symmetrically. Phonation is normal. CN XI: Head turning and shoulder shrug are intact CN XII: Tongue is midline with normal movements and no atrophy.  MOTOR: There is no pronator drift of out-stretched arms. Muscle bulk and tone are normal. Muscle strength is normal.  REFLEXES: Reflexes are 3  and symmetric at the biceps, triceps, knees,  and ankles. Plantar responses are extensor bilaterally.  SENSORY: Light touch, pinprick, position sense, and vibration sense are intact in fingers and toes.  COORDINATION: Rapid alternating movements and fine finger movements are intact. There is no dysmetria on finger-to-nose and heel-knee-shin. There are no abnormal or extraneous movements.   GAIT/STANCE: Posture is normal. Gait is steady with normal steps, base, arm swing, and turning. Heel and toe walking are normal. Mild difficulty with tandem walking Romberg is absent.   DIAGNOSTIC DATA (LABS, IMAGING, TESTING) - I reviewed patient records, labs, notes, testing and imaging myself where available.  Lab Results  Component Value Date   WBC 7.1 05/25/2015  HGB 13.9 05/25/2015   HCT 42.1 05/25/2015   MCV 91.1 05/25/2015   PLT 231.0 05/25/2015      Component Value Date/Time   NA 139 05/25/2015 1049   NA 143 01/03/2015 1636   K 4.6 05/25/2015 1049   K 3.5 01/03/2015 1636   CL 104 05/25/2015 1049   CL 107 01/03/2015 1636   CO2 30 05/25/2015 1049   CO2 30 01/03/2015 1636   GLUCOSE 129* 05/25/2015 1049   GLUCOSE 135* 01/03/2015 1636   BUN 19 05/25/2015 1049   BUN 17 01/03/2015 1636   CREATININE 0.86 05/25/2015 1049   CREATININE 0.89 01/03/2015 1636   CALCIUM 9.3 05/25/2015 1049   CALCIUM 8.9 01/03/2015 1636   PROT 6.9 05/25/2015 1049   PROT 7.3 01/03/2015 1636   ALBUMIN 4.3 05/25/2015 1049   ALBUMIN 3.8 01/03/2015 1636   AST 23 05/25/2015 1049   AST 19 01/03/2015 1636   ALT 23 05/25/2015 1049   ALT 26 01/03/2015 1636   ALKPHOS 58 05/25/2015 1049   ALKPHOS 67 01/03/2015 1636   BILITOT 0.3 05/25/2015 1049   GFRNONAA 71* 01/07/2015 1805   GFRAA 82* 01/07/2015 1805   Lab Results  Component Value Date   CHOL 230* 05/21/2014   HDL 35.50* 05/21/2014   LDLCALC 154* 05/21/2014   LDLDIRECT 181.1 04/13/2013   TRIG 201.0* 05/21/2014   CHOLHDL 6 05/21/2014   Lab Results  Component Value Date   HGBA1C 6.1 05/26/2015    Lab Results  Component Value Date   VITAMINB12 534 05/25/2015   Lab Results  Component Value Date   TSH 3.26 05/25/2015     ASSESSMENT AND PLAN  PHOEBE MARTER is a 74 y.o. female with gradual onset neck achiness, radiating paresthesia to bilateral upper and lower extremity,Lhermitte signs,Hyperreflexia on examination  1.  Most suggestive of spondylitic cervical myelopathy, radiculopathy, consistent with MRI cervical findings detailed above 2, she also complains of chronic low back pain, mid thoracic pain, very anxious about her symptoms, will proceed with MRI of thoracic, lumbar spine, 3, Cymbalta 30 mg every morning for anxiety 4. Referred by neurosurgeon 5, return to clinic in 3-4 weeks  Marcial Pacas, M.D. Ph.D.  [facility] [address], [place], [place] [address] Ph: [phone] Fax: [phone]

## 2015-06-01 NOTE — Telephone Encounter (Signed)
Patient call-"Pamela Norman, let me try to explain why I am unable to follow up today at 10. Relaying this to you as you have direct contact and trust you. Ok to share this info with Dr Theora Gianotti. I had some muscle control issues before Jan and have returned the past month or so on a daily basis . Recently went to a neurologist to find some answers. An MRi this past Sat does reveal some degenerative disks. Had such back pain yesterday and body alignment issues that she  is able to see me today for an evaluation -which may or may not be related to those findings.  My brother from out of town is going to drive me to the [place] office. All this is to say has no association with hysterectomy. Healing fine and pelvic exam will be just fine too.  These other issues mentioned have affected me a lot. Seldom drive lately and have to cancel appointments - something that I would never do under normal conditions.  If you can, reschedule at next earliest day and time with Dr Theora Gianotti please."  rn to respond back via mychart.- Pt does not have any upcoming apt with Dr. Theora Gianotti; however Dr. Theora Gianotti will need to see the patient at the end of August 2016. She has missed 2 consultation appointments with Dr. Massie Maroon. The first she cnl on 05/12/15. She no showed on 05/16/16. Rn to reach to patient and inform Dr. Theora Gianotti of the patient's situation.

## 2015-06-01 NOTE — Patient Instructions (Signed)
[email or web address]

## 2015-06-08 ENCOUNTER — Ambulatory Visit: Payer: Medicare Other | Admitting: Nurse Practitioner

## 2015-06-08 ENCOUNTER — Telehealth: Payer: Self-pay | Admitting: *Deleted

## 2015-06-08 ENCOUNTER — Other Ambulatory Visit: Payer: Self-pay | Admitting: *Deleted

## 2015-06-08 ENCOUNTER — Encounter: Payer: Self-pay | Admitting: *Deleted

## 2015-06-08 ENCOUNTER — Ambulatory Visit: Payer: Self-pay | Admitting: Neurology

## 2015-06-08 NOTE — Telephone Encounter (Signed)
Spoke to [place] - she would like to try gabapentin.

## 2015-06-08 NOTE — Telephone Encounter (Signed)
Dr. Krista Blue prescribed cymbalta the last time that she was here and had told the pt to take one, but when she did take it she thinks she had a severe reaction (disorientation "the likes of which she has never had before"). i told her that I would have michelle RN give her a call. She thanked me

## 2015-06-08 NOTE — Telephone Encounter (Signed)
Neuropathic medication was given for her complains of neck and low back pain,  The other options also include Neurontin, Lyrica, nortriptyline, I would strongly suggested her stay at low-dose Cymbalta till next visit, if she wants to change, I may call in Neurontin 100 mg 3 times a day

## 2015-06-09 ENCOUNTER — Telehealth: Payer: Self-pay

## 2015-06-09 ENCOUNTER — Other Ambulatory Visit: Payer: Self-pay | Admitting: *Deleted

## 2015-06-09 MED ORDER — DIAZEPAM 5 MG PO TABS: 5.0000 mg | ORAL_TABLET | Freq: Every day | ORAL | Status: DC | PRN

## 2015-06-09 MED ORDER — GABAPENTIN 100 MG PO CAPS: ORAL_CAPSULE | ORAL | Status: DC

## 2015-06-09 NOTE — Telephone Encounter (Signed)
Spoke with pt, advised Rx faxed to [facility] in [place]

## 2015-06-09 NOTE — Telephone Encounter (Signed)
Gabapentin sent to pharmacy with titration instructions.

## 2015-06-09 NOTE — Telephone Encounter (Signed)
The patient is hoping to have a return call regarding her valium rx.  She is hoping to have the rx filled early (it is due July 5th)  Callback - [phone]

## 2015-06-09 NOTE — Telephone Encounter (Signed)
Last OV 05/25/15, ok refill?

## 2015-06-09 NOTE — Telephone Encounter (Signed)
Faxed to pharmacy

## 2015-06-09 NOTE — Telephone Encounter (Signed)
Refill for 30 days  

## 2015-06-15 ENCOUNTER — Other Ambulatory Visit: Payer: Medicare Other

## 2015-06-15 ENCOUNTER — Inpatient Hospital Stay: Admission: RE | Admit: 2015-06-15 | Payer: Medicare Other | Source: Ambulatory Visit

## 2015-06-22 ENCOUNTER — Other Ambulatory Visit: Payer: Medicare Other

## 2015-06-24 ENCOUNTER — Telehealth: Payer: Self-pay | Admitting: Neurology

## 2015-06-24 ENCOUNTER — Ambulatory Visit
Admission: RE | Admit: 2015-06-24 | Discharge: 2015-06-24 | Disposition: A | Discharge status: Home / Self Care | Payer: Medicare Other | Source: Ambulatory Visit | Attending: Neurology | Admitting: Neurology

## 2015-06-24 DIAGNOSIS — R202 Paresthesia of skin: Secondary | ICD-10-CM

## 2015-06-24 DIAGNOSIS — R269 Unspecified abnormalities of gait and mobility: Secondary | ICD-10-CM | POA: Diagnosis not present

## 2015-06-24 DIAGNOSIS — M549 Dorsalgia, unspecified: Secondary | ICD-10-CM

## 2015-06-24 DIAGNOSIS — M542 Cervicalgia: Secondary | ICD-10-CM

## 2015-06-29 ENCOUNTER — Other Ambulatory Visit: Payer: Self-pay | Admitting: *Deleted

## 2015-06-29 DIAGNOSIS — M546 Pain in thoracic spine: Secondary | ICD-10-CM

## 2015-06-29 DIAGNOSIS — R269 Unspecified abnormalities of gait and mobility: Secondary | ICD-10-CM

## 2015-06-29 DIAGNOSIS — R202 Paresthesia of skin: Secondary | ICD-10-CM

## 2015-07-01 ENCOUNTER — Ambulatory Visit: Payer: Medicare Other | Admitting: Internal Medicine

## 2015-07-03 NOTE — Telephone Encounter (Signed)
Michelle: Please let patient know, MRI lumbar showed degenerative changes, most significant at L4-5 level with moderate left lateral recess stenosis.  She needs a follow up visit to go over findings.    This is an abnormal MRI of the lumbar spine showing multilevel degenerative changes as detailed above. The most significant findings are at L4-L5 where there is moderate facet hypertrophy, mild disc bulging and left greater than right ligamentum flavum hypertrophy leading to moderate left lateral recess stenosis. There is no definite nerve root compression though there is some encroachment upon the traversing left L5 nerve root. The degenerative changes at the other levels are minimal to mild and do not lead to any nerve root impingement.

## 2015-07-04 NOTE — Telephone Encounter (Signed)
Spoke to [employer] about her MRIs - scheduled her a follow up appt to further discuss results on 07/19/15.

## 2015-07-04 NOTE — Telephone Encounter (Signed)
She could not tolerate MRI thoracic at last attempt, I reordered it.  Follow up appt after MRI thoracic

## 2015-07-07 ENCOUNTER — Other Ambulatory Visit: Payer: Self-pay | Admitting: *Deleted

## 2015-07-07 ENCOUNTER — Ambulatory Visit: Payer: Self-pay | Admitting: Neurology

## 2015-07-07 MED ORDER — DIAZEPAM 5 MG PO TABS: 5.0000 mg | ORAL_TABLET | Freq: Every day | ORAL | Status: DC | PRN

## 2015-07-11 ENCOUNTER — Ambulatory Visit
Admission: RE | Admit: 2015-07-11 | Discharge: 2015-07-11 | Disposition: A | Discharge status: Home / Self Care | Payer: Medicare Other | Source: Ambulatory Visit | Attending: Neurology | Admitting: Neurology

## 2015-07-11 DIAGNOSIS — M546 Pain in thoracic spine: Secondary | ICD-10-CM

## 2015-07-11 DIAGNOSIS — R202 Paresthesia of skin: Secondary | ICD-10-CM | POA: Diagnosis not present

## 2015-07-11 DIAGNOSIS — R269 Unspecified abnormalities of gait and mobility: Secondary | ICD-10-CM

## 2015-07-19 ENCOUNTER — Encounter: Payer: Self-pay | Admitting: Neurology

## 2015-07-19 ENCOUNTER — Ambulatory Visit (INDEPENDENT_AMBULATORY_CARE_PROVIDER_SITE_OTHER): Payer: Medicare Other | Admitting: Neurology

## 2015-07-19 VITALS — BP 133/79 | HR 68 | Ht 66.0 in | Wt 172.0 lb

## 2015-07-19 DIAGNOSIS — M546 Pain in thoracic spine: Secondary | ICD-10-CM

## 2015-07-19 DIAGNOSIS — M4712 Other spondylosis with myelopathy, cervical region: Secondary | ICD-10-CM

## 2015-07-19 DIAGNOSIS — R269 Unspecified abnormalities of gait and mobility: Secondary | ICD-10-CM

## 2015-07-19 DIAGNOSIS — M542 Cervicalgia: Secondary | ICD-10-CM | POA: Diagnosis not present

## 2015-07-19 DIAGNOSIS — M549 Dorsalgia, unspecified: Secondary | ICD-10-CM

## 2015-07-19 DIAGNOSIS — R202 Paresthesia of skin: Secondary | ICD-10-CM

## 2015-07-19 NOTE — Progress Notes (Signed)
Chief Complaint  Patient presents with  . Back Pain    "Pamela Norman" is here to discuss her MRI results.      PATIENT: Pamela Norman DOB: [birth year]  Chief Complaint  Patient presents with  . Back Pain    "Pamela Norman" is here to discuss her MRI results.    HISTORICAL ( Initial visit May 23 2015)  Pamela Norman is a 74 years old right-handed female, of accompanied by her friend Pamela Norman, referred by her primary care physician Dr. Ronette Deter for evaluation of upper and lower extremity weakness   I have reviewed refer, She had total hysterectomy in April 2016 for endometrial cancer, stage I, is planning on to have potential low-dose radiation therapy, she retired from [employer] very active all her life  Since 2015, she noticed gradual onset shoulder neck achiness, also radiating paresthesia from her neck to bilateral upper extremity bilateral hands, she also noticed mild unbalanced gait, "as is my body is out of alignment".  She is very bothered by her symptoms, actually presented to emergency room in May 22 2015, I have  Personally reviewed CT head without contrast that was normal  Laboratory evaluations mild elevated WBC 13 point 8, otherwise normal CBC, CMP.  Her symptoms are gradually getting worse, she also described traveling discomfort along her spine, suggestive of Lhermitte sign, she denies falling episodes, no bowel and bladder incontinence, no bulbar weakness, no visual change.  UPDATE June 01 2015: She is with brother Pamela Norman At today's clinical visit, she is apparently very bothered by her symptoms, but she is a poor historian, she actually presented to the emergency room May 26 2015, for her discomfort  It is hard to elicit history from patient, sounds like she complains of worsening neck pain, radiating pain to bilateral shoulder, arm, mild unsteady gait, no bowel and bladder incontinence,  We have reviewed MRI of the cervical spine together,in May 28 2015:Degenerative change most notable at C5-6 where there is slight deformity of the ventral cord by a disc osteophyte complex and mild to moderate foraminal narrowing due to uncovertebral disease.  She does have hyperreflexia in both upper and lower extremities,  UPDATE August 9th 2016; She is a poor historian, continue complains of neck discomfort," something is bothering me every day", mild unsteady gait, radiating discomfort to bilateral shoulder and arm,  We have reviewed MRI of lumbar, mild degenerative disc disease, no significant foraminal stenosis, thoracic spine films, that was normal  REVIEW OF SYSTEMS: Full 14 system review of systems performed and notable only for as above  ALLERGIES: No Known Allergies  HOME MEDICATIONS: Current Outpatient Prescriptions  Medication Sig Dispense Refill  . diazepam (VALIUM) 5 MG tablet Take 1 tablet (5 mg total) by mouth daily. 30 tablet 0     PAST MEDICAL HISTORY: Past Medical History  Diagnosis Date  . Heart murmur   . Hyperlipidemia   . Squamous cell carcinoma     face  . Cataract   . Endometrial cancer     Grade 1  . History of colonoscopy 2014    within normal limits  . History of mammogram 2015  . History of CVA (cerebrovascular accident)   . Rectal prolapse   . Anxiety   . History of ovarian cyst 01/2015    PAST SURGICAL HISTORY: Past Surgical History  Procedure Laterality Date  . Appendectomy    . Tonsilectomy, adenoidectomy, bilateral myringotomy and tubes    . Combined hysteroscopy diagnostic / d&c  2016  . Laparoscopic hysterectomy  03/30/15  . Bilateral salpingoophorectomy  03/30/15  . Dnc    . Dilation and curettage of uterus      FAMILY HISTORY: Family History  Problem Relation Age of Onset  . Colon cancer Father     older age onset  . COPD Father   . COPD Mother     SOCIAL HISTORY:  History   Social History  . Marital Status: Single    Spouse Name: N/A  . Number of Children: 0  . Years of  Education: PhD   Occupational History  . Retired    Social History Main Topics  . Smoking status: Former Smoker    Quit date: 04/13/1981  . Smokeless tobacco: Never Used  . Alcohol Use: 1.2 oz/week    2 Glasses of wine per week     Comment: Occasional wine.  . Drug Use: No  . Sexual Activity: Not on file   Other Topics Concern  . Not on file   Social History Narrative   Lives in [place]. No children.      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      1-2 cups caffeine daily.    PHYSICAL EXAM   Filed Vitals:   07/19/15 1409  BP: 133/79  Pulse: 68  Height: 5\' 6"  (1.676 m)  Weight: 172 lb (78.019 kg)    Not recorded      Body mass index is 27.77 kg/(m^2).  PHYSICAL EXAMNIATION:  Gen: NAD, conversant, well nourised, obese, well groomed                     Cardiovascular: Regular rate rhythm, no peripheral edema, warm, nontender. Eyes: Conjunctivae clear without exudates or hemorrhage Neck: Supple, no carotid bruise. Pulmonary: Clear to auscultation bilaterally   NEUROLOGICAL EXAM:  MENTAL STATUS: Speech:    Speech is normal; fluent and spontaneous with normal comprehension.  Cognition:    The patient is oriented to person, place, and time;     recent and remote memory intact;     language fluent;     normal attention, concentration,     fund of knowledge.  CRANIAL NERVES: CN II: Visual fields are full to confrontation. Fundoscopic exam is normal with sharp discs and no vascular changes.Pupil were equal round reactive to light CN III, IV, VI: extraocular movement are normal. No ptosis. CN V: Facial sensation is intact to pinprick in all 3 divisions bilaterally. Corneal responses are intact.  CN VII: Face is symmetric with normal eye closure and smile. CN VIII: Hearing is normal to rubbing fingers CN IX, X: Palate elevates symmetrically. Phonation is  normal. CN XI: Head turning and shoulder shrug are intact CN XII: Tongue is midline with normal movements and no atrophy.  MOTOR: There is no pronator drift of out-stretched arms. Muscle bulk and tone are normal. Muscle strength is normal.  REFLEXES: Reflexes are 3  and symmetric at the biceps, triceps, knees, and ankles. Plantar responses are extensor bilaterally.  SENSORY: Light touch, pinprick, position sense, and vibration sense are intact in fingers and toes.  COORDINATION: Rapid alternating movements and fine finger movements are intact. There is no dysmetria on finger-to-nose and heel-knee-shin. There are no abnormal or extraneous movements.   GAIT/STANCE: Posture is normal. Gait is steady with normal steps, base, arm  swing, and turning. Heel and toe walking are normal. Mild difficulty with tandem walking Romberg is absent.   DIAGNOSTIC DATA (LABS, IMAGING, TESTING) - I reviewed patient records, labs, notes, testing and imaging myself where available.  Lab Results  Component Value Date   WBC 7.1 05/25/2015   HGB 13.9 05/25/2015   HCT 42.1 05/25/2015   MCV 91.1 05/25/2015   PLT 231.0 05/25/2015      Component Value Date/Time   NA 139 05/25/2015 1049   NA 143 01/03/2015 1636   K 4.6 05/25/2015 1049   K 3.5 01/03/2015 1636   CL 104 05/25/2015 1049   CL 107 01/03/2015 1636   CO2 30 05/25/2015 1049   CO2 30 01/03/2015 1636   GLUCOSE 129* 05/25/2015 1049   GLUCOSE 135* 01/03/2015 1636   BUN 19 05/25/2015 1049   BUN 17 01/03/2015 1636   CREATININE 0.86 05/25/2015 1049   CREATININE 0.89 01/03/2015 1636   CALCIUM 9.3 05/25/2015 1049   CALCIUM 8.9 01/03/2015 1636   PROT 6.9 05/25/2015 1049   PROT 7.3 01/03/2015 1636   ALBUMIN 4.3 05/25/2015 1049   ALBUMIN 3.8 01/03/2015 1636   AST 23 05/25/2015 1049   AST 19 01/03/2015 1636   ALT 23 05/25/2015 1049   ALT 26 01/03/2015 1636   ALKPHOS 58 05/25/2015 1049   ALKPHOS 67 01/03/2015 1636   BILITOT 0.3 05/25/2015 1049     BILITOT 0.4 01/03/2015 1636   GFRNONAA 71* 01/07/2015 1805   GFRAA 82* 01/07/2015 1805   Lab Results  Component Value Date   CHOL 230* 05/21/2014   HDL 35.50* 05/21/2014   LDLCALC 154* 05/21/2014   LDLDIRECT 181.1 04/13/2013   TRIG 201.0* 05/21/2014   CHOLHDL 6 05/21/2014   Lab Results  Component Value Date   HGBA1C 6.1 05/26/2015   Lab Results  Component Value Date   VITAMINB12 534 05/25/2015   Lab Results  Component Value Date   TSH 3.26 05/25/2015     ASSESSMENT AND PLAN  Pamela Norman is a 74 y.o. female with gradual onset neck achiness, radiating paresthesia to bilateral upper and lower extremity ,Lhermitte signs, hyperreflexia on examination  spondylitic cervical myelopathy   consistent with MRI cervical findings detailed above  She will be evaluated by neurosurgeon Dr. Vertell Limber in July 21 2015,  Continue gabapentin for symptomatic control  Marcial Pacas, M.D. Ph.D.  [facility] [address] [place], [place] [address] Ph: [phone] Fax: [phone]

## 2015-07-25 ENCOUNTER — Encounter: Payer: Self-pay | Admitting: Emergency Medicine

## 2015-07-25 ENCOUNTER — Emergency Department
Admission: EM | Admit: 2015-07-25 | Discharge: 2015-07-25 | Disposition: A | Discharge status: Home / Self Care | Payer: Medicare Other | Attending: Emergency Medicine | Admitting: Emergency Medicine

## 2015-07-25 ENCOUNTER — Emergency Department: Payer: Medicare Other

## 2015-07-25 DIAGNOSIS — M19012 Primary osteoarthritis, left shoulder: Secondary | ICD-10-CM | POA: Diagnosis not present

## 2015-07-25 DIAGNOSIS — F419 Anxiety disorder, unspecified: Secondary | ICD-10-CM | POA: Insufficient documentation

## 2015-07-25 DIAGNOSIS — M25512 Pain in left shoulder: Secondary | ICD-10-CM | POA: Diagnosis present

## 2015-07-25 DIAGNOSIS — Z87891 Personal history of nicotine dependence: Secondary | ICD-10-CM | POA: Diagnosis not present

## 2015-07-25 DIAGNOSIS — E785 Hyperlipidemia, unspecified: Secondary | ICD-10-CM | POA: Insufficient documentation

## 2015-07-25 MED ORDER — TRAMADOL HCL 50 MG PO TABS
50.0000 mg | ORAL_TABLET | Freq: Once | ORAL | Status: AC
  Administered 2015-07-25: 50 mg via ORAL
  Filled 2015-07-25: qty 1

## 2015-07-25 MED ORDER — MELOXICAM 15 MG PO TABS: 15.0000 mg | ORAL_TABLET | Freq: Every day | ORAL | Status: DC

## 2015-07-25 MED ORDER — DIAZEPAM 5 MG PO TABS
5.0000 mg | ORAL_TABLET | Freq: Once | ORAL | Status: AC
  Administered 2015-07-25: 5 mg via ORAL
  Filled 2015-07-25: qty 1

## 2015-07-25 NOTE — ED Notes (Signed)
Pt states "my arm is out of joint", pt reports she was standing in kitchen and reports left arm feels different. Pt able to shrug shoulders and move left arm accordingly.

## 2015-07-25 NOTE — Discharge Instructions (Signed)

## 2015-07-25 NOTE — ED Provider Notes (Signed)
[facility] Emergency Department Provider Note  ____________________________________________  Time seen: Approximately 5:36 PM  I have reviewed the triage vital signs and the nursing notes.   HISTORY  Chief Complaint Shoulder Pain and Arm Pain    HPI Pamela Norman is a 74 y.o. female patient complain of left shoulder discomfort. Patient denies any history of trauma. Patient state this. Aspect of her right humerus appears to be out of joint. Patient stated Pain increases and is rated at a 9/10 with abduction or trying to reach overhead.No palliative measures taken for this complaint.   Past Medical History  Diagnosis Date  . Heart murmur   . Hyperlipidemia   . Squamous cell carcinoma     face  . Cataract   . Endometrial cancer     Grade 1  . History of colonoscopy 2014    within normal limits  . History of mammogram 2015  . History of CVA (cerebrovascular accident)   . Rectal prolapse   . Anxiety   . History of ovarian cyst 01/2015    Patient Active Problem List   Diagnosis Date Noted  . Conversion disorder 05/26/2015  . Paresthesias 05/25/2015  . Cervicalgia 05/11/2015  . Endometrial cancer 05/04/2015  . Other and unspecified hyperlipidemia 05/21/2014  . Elevated blood sugar 05/21/2014  . Medicare annual wellness visit, subsequent 05/19/2013  . Screening for breast cancer 04/13/2013  . Special screening for malignant neoplasms, colon 04/13/2013    Past Surgical History  Procedure Laterality Date  . Appendectomy    . Tonsilectomy, adenoidectomy, bilateral myringotomy and tubes    . Combined hysteroscopy diagnostic / d&c  2016  . Laparoscopic hysterectomy  03/30/15  . Bilateral salpingoophorectomy  03/30/15  . Dnc    . Dilation and curettage of uterus      Current Outpatient Rx  Name  Route  Sig  Dispense  Refill  . diazepam (VALIUM) 5 MG tablet   Oral   Take 1 tablet (5 mg total) by mouth daily as needed.   30 tablet   0   .  gabapentin (NEURONTIN) 100 MG capsule      Take one capsule at bedtime x one week, then increase to one capsule in morning and one capsule at bedtime x one week, then increase to one capsule three times daily.   90 capsule   0     Allergies Review of patient's allergies indicates no known allergies.  Family History  Problem Relation Age of Onset  . Colon cancer Father     older age onset  . COPD Father   . COPD Mother     Social History Social History  Substance Use Topics  . Smoking status: Former Smoker    Quit date: 04/13/1981  . Smokeless tobacco: Never Used  . Alcohol Use: 1.2 oz/week    2 Glasses of wine per week     Comment: Occasional wine.    Review of Systems Constitutional: No fever/chills Eyes: No visual changes. ENT: No sore throat. Cardiovascular: Denies chest pain. Respiratory: Denies shortness of breath. Gastrointestinal: No abdominal pain.  No nausea, no vomiting.  No diarrhea.  No constipation. Genitourinary: Negative for dysuria. Musculoskeletal: Shoulder pain  Skin: Negative for rash. Neurological: Negative for headaches, focal weakness or numbness. Psychiatric: Anxiety Endocrine:Hyperlipidemia 10-point ROS otherwise negative.  ____________________________________________   PHYSICAL EXAM:  VITAL SIGNS: ED Triage Vitals  Enc Vitals Group     BP 07/25/15 1715 152/77 mmHg  Pulse Rate 07/25/15 1715 72     Resp 07/25/15 1715 16     Temp 07/25/15 1715 97.5 F (36.4 C)     Temp Source 07/25/15 1715 Oral     SpO2 07/25/15 1715 100 %     Weight 07/25/15 1715 173 lb (78.472 kg)     Height 07/25/15 1715 5\' 6"  (1.676 m)     Head Cir --      Peak Flow --      Pain Score 07/25/15 1715 9     Pain Loc --      Pain Edu? --      Excl. in [place]? --     Constitutional: Alert and oriented. Well appearing and in no acute distress. Eyes: Conjunctivae are normal. PERRL. EOMI. Head: Atraumatic. Nose: No congestion/rhinnorhea. Mouth/Throat: Mucous  membranes are moist.  Oropharynx non-erythematous. Neck: No stridor.  No cervical spine tenderness to palpation. Hematological/Lymphatic/Immunilogical: No cervical lymphadenopathy. Cardiovascular: Normal rate, regular rhythm. Grossly normal heart sounds.  Good peripheral circulation. Respiratory: Normal respiratory effort.  No retractions. Lungs CTAB. Gastrointestinal: Soft and nontender. No distention. No abdominal bruits. No CVA tenderness. Musculoskeletal: No obvious deformity of the left upper extremity. Tender palpation superior aspect of the humerus. Patient is able to shrug shoulders with decreased range of motion with abduction of the arm.  Neurologic:  Normal speech and language. No gross focal neurologic deficits are appreciated. No gait instability. Skin:  Skin is warm, dry and intact. No rash noted. Psychiatric: Mood and affect are normal. Speech and behavior are normal.  ____________________________________________   LABS (all labs ordered are listed, but only abnormal results are displayed)  Labs Reviewed - No data to display ____________________________________________  EKG   ____________________________________________  RADIOLOGY  X-ray unremarkable except for some mild arthritis. ____________________________________________   PROCEDURES  Procedure(s) performed: None  Critical Care performed: No  ____________________________________________   INITIAL IMPRESSION / ASSESSMENT AND PLAN / ED COURSE  Pertinent labs & imaging results that were available during my care of the patient were reviewed by me and considered in my medical decision making (see chart for details).  Left shoulder pain. Discussed x-ray findings with patient. Patient becomes more agitated when she's told that there is no acute findings. Patient now states her whole body feel like is twisted. Patient states she is unable to move her shoulders but was able to tolerate passive range of motion.  ____________________________________________   FINAL CLINICAL IMPRESSION(S) / ED DIAGNOSES  Final diagnoses:  Left shoulder pain  Anxiety      Sable Feil, PA-C 07/25/15 1836  Lavonia Drafts, MD 07/26/15 [phone]

## 2015-07-25 NOTE — ED Notes (Signed)
Today develop-0ed tenderness to left shoulder with sligyht movement, now has limited movement of left shoulder

## 2015-07-26 ENCOUNTER — Telehealth: Payer: Self-pay | Admitting: *Deleted

## 2015-07-26 ENCOUNTER — Telehealth: Payer: Self-pay | Admitting: Internal Medicine

## 2015-07-26 ENCOUNTER — Telehealth: Payer: Self-pay | Admitting: Neurology

## 2015-07-26 NOTE — Telephone Encounter (Signed)
Pamela Norman, Called about pt and would like to have pt seen today if possible. Pamela would like to talk with the nurse first though. Pt was taken to the ER last night, pt was stating her shoulder was dislocated but was not. Please call as soon as possible. Pamela [phone].

## 2015-07-26 NOTE — Telephone Encounter (Signed)
Pt called to reschedule her consultation appointment with Dr. Baruch Gouty that she had cancelled in June.  She stated she needed this appointment prior to seeing Dr. Theora Gianotti on 08/03/15.  Pt asked for Monday appt.  Appt. Given for Monday 08/01/15 at 2:00.  Pt read back appointment date and time and stated she will be here no matter what.

## 2015-07-26 NOTE — Telephone Encounter (Signed)
Spoke to [place] (on [employer]) - who tells me the patient's anxiety is worse and they suspect depression.  She is having pain but refusing to take her medication.  She went to the ED last night for shoulder pain and anxiety.  Her shoulder xray was normal and she was instructed to follow up with her PCP in two day.  Her PCP is already prescribing diazepam for anxiety.  Instructed Diane that she needs to call her PCP and let them know an appointment is needed. She verbalized understanding and will do this today.  She will also contact the patient's brother for additional help.

## 2015-07-26 NOTE — Telephone Encounter (Signed)
Pamela Norman called to state the patient is having issues with depression and anxiety. Pt things that the inside of the body is moving and will not take meds that will help. Pamela Norman will be coming with pt for appt on Friday/msn

## 2015-07-28 ENCOUNTER — Other Ambulatory Visit: Payer: Self-pay | Admitting: Internal Medicine

## 2015-07-28 NOTE — Telephone Encounter (Signed)
Please advise on refill. Patient has appointment with you on 07/28/15 (tomorrow).

## 2015-07-29 ENCOUNTER — Encounter: Payer: Self-pay | Admitting: Emergency Medicine

## 2015-07-29 ENCOUNTER — Emergency Department
Admission: EM | Admit: 2015-07-29 | Discharge: 2015-08-01 | Disposition: A | Discharge status: Other Acute Inpatient Hospital | Payer: Medicare Other | Attending: Emergency Medicine | Admitting: Emergency Medicine

## 2015-07-29 ENCOUNTER — Encounter: Payer: Self-pay | Admitting: Internal Medicine

## 2015-07-29 ENCOUNTER — Ambulatory Visit (INDEPENDENT_AMBULATORY_CARE_PROVIDER_SITE_OTHER): Payer: Medicare Other | Admitting: Internal Medicine

## 2015-07-29 VITALS — BP 156/77 | HR 71 | Temp 97.9°F | Ht 66.0 in | Wt 170.4 lb

## 2015-07-29 DIAGNOSIS — F419 Anxiety disorder, unspecified: Secondary | ICD-10-CM | POA: Insufficient documentation

## 2015-07-29 DIAGNOSIS — Z791 Long term (current) use of non-steroidal anti-inflammatories (NSAID): Secondary | ICD-10-CM | POA: Diagnosis not present

## 2015-07-29 DIAGNOSIS — Z79899 Other long term (current) drug therapy: Secondary | ICD-10-CM | POA: Diagnosis not present

## 2015-07-29 DIAGNOSIS — R202 Paresthesia of skin: Secondary | ICD-10-CM | POA: Diagnosis not present

## 2015-07-29 DIAGNOSIS — F22 Delusional disorders: Secondary | ICD-10-CM | POA: Diagnosis not present

## 2015-07-29 DIAGNOSIS — Z0389 Encounter for observation for other suspected diseases and conditions ruled out: Secondary | ICD-10-CM | POA: Diagnosis not present

## 2015-07-29 DIAGNOSIS — F449 Dissociative and conversion disorder, unspecified: Secondary | ICD-10-CM | POA: Diagnosis not present

## 2015-07-29 DIAGNOSIS — R443 Hallucinations, unspecified: Secondary | ICD-10-CM | POA: Diagnosis present

## 2015-07-29 DIAGNOSIS — Z87891 Personal history of nicotine dependence: Secondary | ICD-10-CM | POA: Insufficient documentation

## 2015-07-29 LAB — COMPREHENSIVE METABOLIC PANEL
ALK PHOS: 69 U/L (ref 38–126)
ALT: 20 U/L (ref 14–54)
ANION GAP: 7 (ref 5–15)
AST: 23 U/L (ref 15–41)
Albumin: 4.9 g/dL (ref 3.5–5.0)
BILIRUBIN TOTAL: 0.5 mg/dL (ref 0.3–1.2)
BUN: 19 mg/dL (ref 6–20)
CALCIUM: 9.5 mg/dL (ref 8.9–10.3)
CO2: 30 mmol/L (ref 22–32)
Chloride: 105 mmol/L (ref 101–111)
Creatinine, Ser: 0.81 mg/dL (ref 0.44–1.00)
GFR calc Af Amer: >60 mL/min (ref >60)
Glucose, Bld: 142 mg/dL — ABNORMAL HIGH (ref 65–99)
Potassium: 3.8 mmol/L (ref 3.5–5.1)
Sodium: 142 mmol/L (ref 135–145)
TOTAL PROTEIN: 8.2 g/dL — AB (ref 6.5–8.1)

## 2015-07-29 LAB — CBC WITH DIFFERENTIAL/PLATELET
Basophils Absolute: 0 10*3/uL (ref 0–0.1)
Basophils Relative: 0 %
Eosinophils Absolute: 0.1 10*3/uL (ref 0–0.7)
Eosinophils Relative: 1 %
HEMATOCRIT: 44.4 % (ref 35.0–47.0)
Hemoglobin: 14.6 g/dL (ref 12.0–16.0)
LYMPHS ABS: 1.4 10*3/uL (ref 1.0–3.6)
LYMPHS PCT: 18 %
MCH: 29.9 pg (ref 26.0–34.0)
MCHC: 33 g/dL (ref 32.0–36.0)
MCV: 90.7 fL (ref 80.0–100.0)
MONO ABS: 0.6 10*3/uL (ref 0.2–0.9)
MONOS PCT: 7 %
NEUTROS ABS: 5.7 10*3/uL (ref 1.4–6.5)
Neutrophils Relative %: 74 %
Platelets: 236 10*3/uL (ref 150–440)
RBC: 4.89 MIL/uL (ref 3.80–5.20)
RDW: 14.2 % (ref 11.5–14.5)
WBC: 7.7 10*3/uL (ref 3.6–11.0)

## 2015-07-29 LAB — URINALYSIS COMPLETE WITH MICROSCOPIC (ARMC ONLY)
BILIRUBIN URINE: NEGATIVE
GLUCOSE, UA: NEGATIVE mg/dL
HGB URINE DIPSTICK: NEGATIVE
Ketones, ur: NEGATIVE mg/dL
LEUKOCYTES UA: NEGATIVE
Nitrite: NEGATIVE
Protein, ur: NEGATIVE mg/dL
Specific Gravity, Urine: 1.005 (ref 1.005–1.030)
pH: 6 (ref 5.0–8.0)

## 2015-07-29 LAB — LIPASE, BLOOD: LIPASE: 29 U/L (ref 22–51)

## 2015-07-29 NOTE — ED Notes (Addendum)
Patient states when asked if she is suicidal and homicidal her response is "of course not, of course not"  Patient again states she is here to see a gynecologist.

## 2015-07-29 NOTE — ED Notes (Signed)
Changed into hospital scrubs. Belongings secured.

## 2015-07-29 NOTE — ED Notes (Signed)
MD at bedside. (Dr. Weber Cooks)

## 2015-07-29 NOTE — ED Notes (Signed)
BEHAVIORAL HEALTH ROUNDING Patient sleeping: No. Patient alert and oriented: yes Behavior appropriate: No.; If no, describe: frequent pacing in room, continues to explain same story Nutrition and fluids offered: Yes  Toileting and hygiene offered: Yes  Sitter present: yes Law enforcement present: Yes

## 2015-07-29 NOTE — ED Notes (Signed)
BEHAVIORAL HEALTH ROUNDING Patient sleeping: Yes.   Patient alert and oriented: yes Behavior appropriate: Yes.  ; If no, describe:  Nutrition and fluids offered: No Toileting and hygiene offered: Yes  Sitter present: yes Law enforcement present: Yes  

## 2015-07-29 NOTE — ED Notes (Signed)
BEHAVIORAL HEALTH ROUNDING Patient sleeping: No. Patient alert and oriented: yes Behavior appropriate: Yes.  ; If no, describe:  Nutrition and fluids offered: Yes  Toileting and hygiene offered: Yes  Sitter present: yes Law enforcement present: Yes  

## 2015-07-29 NOTE — Consult Note (Signed)
[facility] Face-to-Face Psychiatry Consult   Reason for Consult:  Consult for this 74 year old woman with a history of oddly presenting physical complaints which I previously diagnosed as a conversion disorder. She was sent here from her primary care doctor's office with instructions that she was to be under involuntary commitment Referring Physician:  Quale Patient Identification: Pamela Norman MRN:  [id number] Principal Diagnosis: Conversion disorder Diagnosis:   Patient Active Problem List   Diagnosis Date Noted  . Conversion disorder [F44.9] 05/26/2015  . Paresthesias [R20.2] 05/25/2015  . Cervicalgia [M54.2] 05/11/2015  . Endometrial cancer [C54.1] 05/04/2015  . Other and unspecified hyperlipidemia [E78.5] 05/21/2014  . Elevated blood sugar [R73.09] 05/21/2014  . Medicare annual wellness visit, subsequent [Z00.00] 05/19/2013  . Screening for breast cancer [Z12.39] 04/13/2013  . Special screening for malignant neoplasms, colon [Z12.11] 04/13/2013    Total Time spent with patient: 1 hour  Subjective:   Pamela Norman is a 74 y.o. female patient admitted with "I am here under false pretenses".  HPI:  Information from the patient and the chart. Case reviewed with emergency room doctor. Patient familiar to me from previous hospital visits. This 74 year old woman has a chronic belief that she has multiple strangely described medical problems. She is minimizing them in conversation with me but I am aware that she believes at times that the organs inside her body have been turned backwards, apparently she told her primary care doctor that there was something wrong with her vagina or internal organs. She complains to me of her neck pain and feeling like there is something off balance about her body. It's reported in the commitment paperwork that she has stated that she felt like there was a gun being placed to her head. Friends and primary care doctors seem too of been more upset recently about her  presentation of her symptoms. She refuses to accept reassurance that there is nothing physically wrong with her. Patient denies to me that she has any suicidal thoughts.  Past psychiatric history: I have evaluated her previously in the emergency room at full laying and concluded that she has a conversion disorder. She has multiple strange beliefs about her physical condition that are resistant to any evidence. We don't have any proof however that she has any history of suicidality or violence.  Substance abuse history: Does not drink or abuse any other drugs no known history of it.  Medical history: Does have a history of multiple medical problems including a past history of endometrial cancer, elevated blood sugars hyperlipidemia.  Social history: Patient lives alone. She is a retired Arts administrator. Seems to have a fairly active social life normally. HPI Elements:   Quality:  confusion and possible delusions with odd physical beliefs. Severity:  moderate but persistent. Timing:  seems to been going on for at least many months. Duration:  ongoing. Context:  no clear cause.  Past Medical History:  Past Medical History  Diagnosis Date  . Heart murmur   . Hyperlipidemia   . Squamous cell carcinoma     face  . Cataract   . Endometrial cancer     Grade 1  . History of colonoscopy 2014    within normal limits  . History of mammogram 2015  . History of CVA (cerebrovascular accident)   . Rectal prolapse   . Anxiety   . History of ovarian cyst 01/2015    Past Surgical History  Procedure Laterality Date  . Appendectomy    . Tonsilectomy,  adenoidectomy, bilateral myringotomy and tubes    . Combined hysteroscopy diagnostic / d&c  2016  . Laparoscopic hysterectomy  03/30/15  . Bilateral salpingoophorectomy  03/30/15  . Dnc    . Dilation and curettage of uterus    . Abdominal hysterectomy    . Tonsillectomy     Family History:  Family History  Problem Relation Age of Onset   . Colon cancer Father     older age onset  . COPD Father   . COPD Mother    Social History:  History  Alcohol Use  . 1.2 oz/week  . 2 Glasses of wine per week    Comment: Occasional wine.     History  Drug Use No    Social History   Social History  . Marital Status: Single    Spouse Name: N/A  . Number of Children: 0  . Years of Education: PhD   Occupational History  . Retired    Social History Main Topics  . Smoking status: Former Smoker    Quit date: 04/13/1981  . Smokeless tobacco: Never Used  . Alcohol Use: 1.2 oz/week    2 Glasses of wine per week     Comment: Occasional wine.  . Drug Use: No  . Sexual Activity: Not Asked   Other Topics Concern  . None   Social History Narrative   Lives in [place]. No children.      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      1-2 cups caffeine daily.   Additional Social History:    Pain Medications: None Reported Prescriptions: None Reported Over the Counter: None Reported History of alcohol / drug use?: No history of alcohol / drug abuse (None Reported) Longest period of sobriety (when/how long): None Reported Negative Consequences of Use:  (None Reported) Withdrawal Symptoms:  (None Reported)                     Allergies:  No Known Allergies  Labs:  Results for orders placed or performed during the hospital encounter of 07/29/15 (from the past 48 hour(s))  CBC with Differential     Status: None   Collection Time: 07/29/15  2:00 PM  Result Value Ref Range   WBC 7.7 3.6 - 11.0 K/uL   RBC 4.89 3.80 - 5.20 MIL/uL   Hemoglobin 14.6 12.0 - 16.0 g/dL   HCT 44.4 35.0 - 47.0 %   MCV 90.7 80.0 - 100.0 fL   MCH 29.9 26.0 - 34.0 pg   MCHC 33.0 32.0 - 36.0 g/dL   RDW 14.2 11.5 - 14.5 %   Platelets 236 150 - 440 K/uL   Neutrophils Relative % 74 %   Neutro Abs 5.7 1.4 - 6.5 K/uL   Lymphocytes Relative  18 %   Lymphs Abs 1.4 1.0 - 3.6 K/uL   Monocytes Relative 7 %   Monocytes Absolute 0.6 0.2 - 0.9 K/uL   Eosinophils Relative 1 %   Eosinophils Absolute 0.1 0 - 0.7 K/uL   Basophils Relative 0 %   Basophils Absolute 0.0 0 - 0.1 K/uL  Comprehensive metabolic panel     Status: Abnormal   Collection Time: 07/29/15  2:00 PM  Result Value Ref Range   Sodium 142 135 - 145 mmol/L   Potassium 3.8 3.5 - 5.1 mmol/L  Chloride 105 101 - 111 mmol/L   CO2 30 22 - 32 mmol/L   Glucose, Bld 142 (H) 65 - 99 mg/dL   BUN 19 6 - 20 mg/dL   Creatinine, Ser 0.81 0.44 - 1.00 mg/dL   Calcium 9.5 8.9 - 10.3 mg/dL   Total Protein 8.2 (H) 6.5 - 8.1 g/dL   Albumin 4.9 3.5 - 5.0 g/dL   AST 23 15 - 41 U/L   ALT 20 14 - 54 U/L   Alkaline Phosphatase 69 38 - 126 U/L   Total Bilirubin 0.5 0.3 - 1.2 mg/dL   GFR calc non Af Amer >60 >60 mL/min   GFR calc Af Amer >60 >60 mL/min    Comment: (NOTE) The eGFR has been calculated using the CKD EPI equation. This calculation has not been validated in all clinical situations. eGFR's persistently <60 mL/min signify possible Chronic Kidney Disease.    Anion gap 7 5 - 15  Lipase, blood     Status: None   Collection Time: 07/29/15  2:00 PM  Result Value Ref Range   Lipase 29 22 - 51 U/L  Urinalysis complete, with microscopic ([facility] only)     Status: Abnormal   Collection Time: 07/29/15  2:40 PM  Result Value Ref Range   Color, Urine STRAW (A) YELLOW   APPearance CLEAR (A) CLEAR   Glucose, UA NEGATIVE NEGATIVE mg/dL   Bilirubin Urine NEGATIVE NEGATIVE   Ketones, ur NEGATIVE NEGATIVE mg/dL   Specific Gravity, Urine 1.005 1.005 - 1.030   Hgb urine dipstick NEGATIVE NEGATIVE   pH 6.0 5.0 - 8.0   Protein, ur NEGATIVE NEGATIVE mg/dL   Nitrite NEGATIVE NEGATIVE   Leukocytes, UA NEGATIVE NEGATIVE   RBC / HPF 0-5 0 - 5 RBC/hpf   WBC, UA 0-5 0 - 5 WBC/hpf   Bacteria, UA RARE (A) NONE SEEN   Squamous Epithelial / LPF 0-5 (A) NONE SEEN   Mucous PRESENT     Vitals:  Blood pressure 148/97, pulse 75, temperature 98.5 F (36.9 C), temperature source Oral, resp. rate 18, height 5' 6"  (1.676 m), weight 77.565 kg (171 lb), SpO2 100 %.  Risk to Self: Suicidal Ideation: No Suicidal Intent: No Is patient at risk for suicide?: No Suicidal Plan?: No Access to Means: No What has been your use of drugs/alcohol within the last 12 months?: None Reported How many times?: 0 Other Self Harm Risks: None Reported Triggers for Past Attempts: Unknown Intentional Self Injurious Behavior: None Risk to Others: Homicidal Ideation: No Thoughts of Harm to Others: No Current Homicidal Intent: No Current Homicidal Plan: No Access to Homicidal Means: No Identified Victim: None Reported History of harm to others?: No Assessment of Violence: None Noted Violent Behavior Description: None Reported Does patient have access to weapons?: No Criminal Charges Pending?: No Does patient have a court date: No Prior Inpatient Therapy: Prior Inpatient Therapy: No Prior Therapy Dates: n/a Prior Therapy Facilty/Provider(s): n/a Reason for Treatment: n/a Prior Outpatient Therapy: Prior Outpatient Therapy: No Prior Therapy Dates: n/a Prior Therapy Facilty/Provider(s): n/a Reason for Treatment: n/a Does patient have an ACCT team?: No Does patient have Intensive In-House Services?  : No Does patient have [facility] services? : No Does patient have P4CC services?: No  No current facility-administered medications for this encounter.   Current Outpatient Prescriptions  Medication Sig Dispense Refill  . diazepam (VALIUM) 5 MG tablet Take 1 tablet (5 mg total) by mouth daily as needed. (Patient taking differently: Take 5 mg by  mouth daily as needed for anxiety. ) 30 tablet 0  . DULoxetine (CYMBALTA) 30 MG capsule Take 60 mg by mouth daily.    Marland Kitchen gabapentin (NEURONTIN) 100 MG capsule Take 100 mg by mouth 3 (three) times daily.    . meloxicam (MOBIC) 15 MG tablet Take 1 tablet (15 mg total) by  mouth daily. 30 tablet 0    Musculoskeletal: Strength & Muscle Tone: within normal limits Gait & Station: normal Patient leans: N/A  Psychiatric Specialty Exam: Physical Exam  Nursing note and vitals reviewed. Constitutional: She appears well-developed and well-nourished.  HENT:  Head: Normocephalic and atraumatic.  Eyes: Conjunctivae are normal. Pupils are equal, round, and reactive to light.  Neck: Normal range of motion.  Cardiovascular: Normal heart sounds.   Respiratory: Effort normal.  GI: Soft.  Musculoskeletal: Normal range of motion.  Neurological: She is alert.  Skin: Skin is warm and dry.  Psychiatric: Her mood appears anxious. Her speech is tangential. She is agitated. Thought content is delusional. Cognition and memory are impaired. She expresses impulsivity.    Review of Systems  Constitutional: Negative.   Eyes: Negative.   Respiratory: Negative.   Cardiovascular: Negative.   Gastrointestinal: Negative.   Musculoskeletal: Positive for neck pain.  Skin: Negative.   Neurological: Negative.   Psychiatric/Behavioral: Negative for depression, suicidal ideas, hallucinations and substance abuse. The patient is nervous/anxious.     Blood pressure 148/97, pulse 75, temperature 98.5 F (36.9 C), temperature source Oral, resp. rate 18, height 5' 6"  (1.676 m), weight 77.565 kg (171 lb), SpO2 100 %.Body mass index is 27.61 kg/(m^2).  General Appearance: Casual  Eye Contact::  Good  Speech:  Clear and Coherent  Volume:  Normal  Mood:  Anxious  Affect:  Congruent  Thought Process:  Disorganized  Orientation:  Full (Time, Place, and Person)  Thought Content:  Delusions  Suicidal Thoughts:  No  Homicidal Thoughts:  No  Memory:  Immediate;   Good Recent;   Good Remote;   Good  Judgement:  Impaired  Insight:  Lacking  Psychomotor Activity:  Increased  Concentration:  Poor  Recall:  [place] of Knowledge:Fair  Language: Fair  Akathisia:  No  Handed:  Right   AIMS (if indicated):     Assets:  Communication Skills Desire for Improvement Financial Resources/Insurance Housing Physical Health Social Support  ADL's:  Intact  Cognition: WNL  Sleep:      Medical Decision Making: Review of Psycho-Social Stressors (1), Review or order clinical lab tests (1), Established Problem, Worsening (2), Review of Medication Regimen & Side Effects (2) and Review of New Medication or Change in Dosage (2)  Treatment Plan Summary: Plan patient was placed under involuntary commitment by her friend with support from her doctor. There are now allegations that she has made some dangerous statements. She does persist in her confused believes which are apparently causing even more distressed of those around her. At this time I will support the continued minute have requested our staff to refer her to a geriatric psychiatry unit. Explain the situation to the patient was quite upset. We will continue to evaluate regularly. No specific indication for medicine at this time.  Plan:  Recommend psychiatric Inpatient admission when medically cleared. Supportive therapy provided about ongoing stressors. Disposition: as noted above  Alethia Berthold 07/29/2015 6:34 PM

## 2015-07-29 NOTE — Assessment & Plan Note (Addendum)
Active hallucination. I feel that she is a potential threat to herself and others, given that she has been talking about using a gun to caregiver at home. Recommended urgent psych admission. Pt agreed to go to ER for "specialist evaluation."  Called ER and spoke with charge nurse.

## 2015-07-29 NOTE — ED Notes (Signed)
Sent by Dr Derry Skill, office , pt states that she is here to see the specialist , pt states that " I have rectal tissue" , Dr Gilford Rile stated to [facility] RN that the patient is delusion and hallucinating, and speaking about guns being at the house" pt denies any such thing, " I am here to see the specialist"

## 2015-07-29 NOTE — ED Notes (Signed)
Pt states she has rectal tissue and is here to see the specialist.  Last couple of days been having problems "down there" has had cervical disc issues as well. Patient is seen pacing around the room. Won't sit down.  C/o generalized pain from "being off centered"

## 2015-07-29 NOTE — BH Assessment (Addendum)
Assessment Note  SIBLE STRALEY is an 74 y.o. female who presents to the ER due to being referred by her PCP, Dr. Gilford Rile. Patient was brought in by her friend to avoid her being placed under IVC and being transported by [employer], to the ER. Patient was seen in their office today due to concerns about her "medical condition." During the visit the patient reported, something entering her body, down different parts and twisting in her causing her body to be sick. She also reported to the Dr., she was having trouble getting her hair straight. Patient believes her head turns around(360 degrees) and now her hair is out of place and she is unable to get her hair back in place.  According to the patient, she was tricked to come to the ER. During the initial part of the interview, the patient reported having several medical concerns. She states, her rectum was having problems and it needed to get it fix. She is having trouble in her arms that is missing up her coordination.  After Probation officer shared the specifics as to why he was talking with her and his role in the ER, patient immediately began to share how healthy she was and that her problems aren't that bad to be in the hospital.  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   At one point the patient gave this writer a mini instructional on how to throw "good jump shots, free throw and hooks."  Patient used rolled up paper towels to demonstrate it.  Patient was able to walk, jog in place and at one point the patient, used a "sike basketball move" to show the writer, "I still got it."   According to the patient friend, the patient has has had the fixed delusion about her health for a year. She was seen for it but wasn't at the point of needing treatment. Friend states, it has worsening in the last 30 days, but  this week has been the worse. Friend states, she has no past mental health treatment or concerns. "She is very smart, she think she can out smart other people. When   Patient currently denies SI/HI and AV/H.   Axis I: Psychotic Disorder NOS Axis III:  Past Medical History  Diagnosis Date  . Heart murmur   . Hyperlipidemia   . Squamous cell carcinoma     face  . Cataract   . Endometrial cancer     Grade 1  . History of colonoscopy 2014    within normal limits  . History of mammogram 2015  . History of CVA (cerebrovascular accident)   . Rectal prolapse   . Anxiety   . History of ovarian cyst 01/2015   Axis IV: problems with access to health care services  Past Medical History:  Past Medical History  Diagnosis Date  . Heart murmur   . Hyperlipidemia   . Squamous cell carcinoma     face  . Cataract   . Endometrial cancer     Grade 1  . History of colonoscopy 2014    within normal limits  . History of mammogram 2015  . History of CVA (cerebrovascular accident)   . Rectal prolapse   . Anxiety   . History of ovarian cyst 01/2015    Past Surgical History  Procedure Laterality Date  . Appendectomy    .  Tonsilectomy, adenoidectomy, bilateral myringotomy and tubes    . Combined hysteroscopy diagnostic / d&c  2016  . Laparoscopic hysterectomy  03/30/15  . Bilateral salpingoophorectomy  03/30/15  . Dnc    . Dilation and curettage of uterus    . Abdominal hysterectomy    . Tonsillectomy      Family History:  Family History  Problem Relation Age of Onset  . Colon cancer Father     older age onset  . COPD Father   . COPD Mother     Social History:  reports that she quit smoking about 34 years ago. She has never used smokeless tobacco. She reports that she drinks about 1.2 oz of alcohol per week. She reports that she does not use illicit drugs.  Additional Social History:  Alcohol / Drug Use Pain Medications: None Reported Prescriptions: None Reported Over the  Counter: None Reported History of alcohol / drug use?: No history of alcohol / drug abuse (None Reported) Longest period of sobriety (when/how long): None Reported Negative Consequences of Use:  (None Reported) Withdrawal Symptoms:  (None Reported)  CIWA: CIWA-Ar BP: (!) 148/97 mmHg Pulse Rate: 75 COWS:    Allergies: No Known Allergies  Home Medications:  (Not in a hospital admission)  OB/GYN Status:  No LMP recorded. Patient has had a hysterectomy.  General Assessment Data Location of Assessment: [facility] ED TTS Assessment: In system Is this a Tele or Face-to-Face Assessment?: Face-to-Face Is this an Initial Assessment or a Re-assessment for this encounter?: Initial Assessment Marital status: Single Maiden name: n/a Is patient pregnant?: No Pregnancy Status: No Living Arrangements: Alone Can pt return to current living arrangement?: Yes Admission Status: Involuntary Is patient capable of signing voluntary admission?: No Referral Source: Self/Family/Friend Insurance type: Medicare  Medical Screening Exam ([place]) Medical Exam completed: Yes  Crisis Care Plan Living Arrangements: Alone Name of Psychiatrist: n/a Name of Therapist: n/a  Education Status Is patient currently in school?: No Current Grade: n/a Highest grade of school patient has completed: PhD. in Physical Education (Confirmed by family friend) Name of school: [employer] person: n/a  Risk to self with the past 6 months Suicidal Ideation: No Suicidal Intent: No Has patient had any suicidal intent within the past 6 months prior to admission? : No Is patient at risk for suicide?: No Suicidal Plan?: No Has patient had any suicidal plan within the past 6 months prior to admission? : No Access to Means: No What has been your use of drugs/alcohol within the last 12 months?: None Reported Previous Attempts/Gestures: No How many times?: 0 Other Self Harm Risks: None  Reported Triggers for Past Attempts: Unknown Intentional Self Injurious Behavior: None Family Suicide History: No Recent stressful life event(s): Other (Comment) (Client worried about health) Persecutory voices/beliefs?: No Depression: No Depression Symptoms:  (None Reported) Substance abuse history and/or treatment for substance abuse?: No Suicide prevention information given to non-admitted patients: Not applicable  Risk to Others within the past 6 months Homicidal Ideation: No Does patient have any lifetime risk of violence toward others beyond the six months prior to admission? : No Thoughts of Harm to Others: No Current Homicidal Intent: No Current Homicidal Plan: No Access to Homicidal Means: No Identified Victim: None Reported History of harm to others?: No Assessment of Violence: None Noted Violent Behavior Description: None Reported Does patient have access to weapons?: No Criminal Charges Pending?: No Does patient have a court date: No Is patient on probation?: No  Psychosis Hallucinations: None noted Delusions: Somatic  Mental Status Report Appearance/Hygiene: In scrubs, In hospital gown, Unremarkable Eye Contact: Good Motor Activity: Freedom of movement (Walk and talk without assistance or devices) Speech: Unremarkable, Logical/coherent Level of Consciousness: Alert Mood: Anxious, Suspicious, Pleasant Affect: Appropriate to circumstance, Anxious Anxiety Level: Minimal Thought Processes: Coherent, Relevant Judgement: Impaired Orientation: Person, Place, Time, Appropriate for developmental age Obsessive Compulsive Thoughts/Behaviors: Moderate  Cognitive Functioning Concentration: Normal Memory: Recent Intact, Remote Intact IQ: Average Insight: Poor Impulse Control: Poor Appetite: Good Weight Loss: 0 Weight Gain: 0 Sleep: No Change Total Hours of Sleep: 8 Vegetative Symptoms: None  ADLScreening [facility] Assessment Services) Patient's cognitive ability  adequate to safely complete daily activities?: Yes Patient able to express need for assistance with ADLs?: Yes Independently performs ADLs?: Yes (appropriate for developmental age)  Prior Inpatient Therapy Prior Inpatient Therapy: No Prior Therapy Dates: n/a Prior Therapy Facilty/Provider(s): n/a Reason for Treatment: n/a  Prior Outpatient Therapy Prior Outpatient Therapy: No Prior Therapy Dates: n/a Prior Therapy Facilty/Provider(s): n/a Reason for Treatment: n/a Does patient have an ACCT team?: No Does patient have Intensive In-House Services?  : No Does patient have [facility] services? : No Does patient have P4CC services?: No  ADL Screening (condition at time of admission) Patient's cognitive ability adequate to safely complete daily activities?: Yes Patient able to express need for assistance with ADLs?: Yes Independently performs ADLs?: Yes (appropriate for developmental age)       Abuse/Neglect Assessment (Assessment to be complete while patient is alone) Physical Abuse: Denies Verbal Abuse: Denies Sexual Abuse: Denies Exploitation of patient/patient's resources: Denies Self-Neglect: Denies Values / Beliefs Cultural Requests During Hospitalization: None Spiritual Requests During Hospitalization: None Consults Spiritual Care Consult Needed: No Social Work Consult Needed: No Regulatory affairs officer (For Healthcare) Does patient have an advance directive?: No Would patient like information on creating an advanced directive?: No - patient declined information    Additional Information 1:1 In Past 12 Months?: No CIRT Risk: No Elopement Risk: No Does patient have medical clearance?: Yes  Child/Adolescent Assessment Running Away Risk:  (Patient is an adult)  Disposition:  Disposition Initial Assessment Completed for this Encounter: Yes Disposition of Patient: Inpatient treatment program Type of inpatient treatment program: Adult  On Site Evaluation by:   Reviewed  with Physician:    Gunnar Fusi MS, LCAS, [place], [place], CCSI 07/29/2015 5:16 PM

## 2015-07-29 NOTE — Patient Instructions (Signed)
We will set up an evaluation today at [facility].  Please follow up in our office after evaluation is complete.

## 2015-07-29 NOTE — ED Notes (Signed)

## 2015-07-29 NOTE — BHH Counselor (Signed)
Referral information for Geriatric Placement have been faxed to;    [facility]([phone]),    [facility]([phone]),    [facility]([phone]),    [phone]),    [facility]([phone]),    [facility]([phone]).   Spoke with pt.'s Friend ([phone]) and informed them of the Psych MD (Dr. Weber Cooks) recommendation. Informed them of the process of a pt. being placed at another facility. Informed them of the visitation hours of the "[facility]" and that they are for 15 mintues, while in they remain in the ER.  Also reviewed with them the IVC process.   Writer called and left a voice message with Brother(Robert [phone]), requesting a return phone call.  Patient information was not left in the message. Both the friend and brother are planning to visit the patient on tomorrow.

## 2015-07-29 NOTE — Progress Notes (Signed)
Pre visit review using our clinic review tool, if applicable. No additional management support is needed unless otherwise documented below in the visit note. 

## 2015-07-29 NOTE — ED Notes (Signed)
Patient pacing around room wants to speak to a medical doctor about her medical issues.  Explained to patient that Dr. Jacqualine Code had already examined her earlier this afternoon and Dr. Weber Cooks was here and sat down to speak with her.  Patients states she needs to see a medical doctor.  Again explained who had seen her and that she has more than medical problems that need more urgent attention.  Also informed patient she will not be going home tonight and will be spending the night here.

## 2015-07-29 NOTE — Progress Notes (Signed)
Subjective:    Patient ID: Pamela Norman, female    DOB: 01-07-1941, 74 y.o.   MRN: [id number]  HPI  74YO female presents for follow up.  Last seen in 05/2015 for paresthesia.  Feels that spine is misaligned. This are "too far back." Denies constipation, diarrhea. No abdominal pain. Denies anxiety.   Caregiver reports she is hallucinating at home. Thinks things are growing out of her arm. Feels that body is twisted. Talking about guns at home. Family feels that she needs psychiatric evaluation.   Past Medical History  Diagnosis Date  . Heart murmur   . Hyperlipidemia   . Squamous cell carcinoma     face  . Cataract   . Endometrial cancer     Grade 1  . History of colonoscopy 2014    within normal limits  . History of mammogram 2015  . History of CVA (cerebrovascular accident)   . Rectal prolapse   . Anxiety   . History of ovarian cyst 01/2015   Family History  Problem Relation Age of Onset  . Colon cancer Father     older age onset  . COPD Father   . COPD Mother    Past Surgical History  Procedure Laterality Date  . Appendectomy    . Tonsilectomy, adenoidectomy, bilateral myringotomy and tubes    . Combined hysteroscopy diagnostic / d&c  2016  . Laparoscopic hysterectomy  03/30/15  . Bilateral salpingoophorectomy  03/30/15  . Dnc    . Dilation and curettage of uterus     Social History   Social History  . Marital Status: Single    Spouse Name: N/A  . Number of Children: 0  . Years of Education: PhD   Occupational History  . Retired    Social History Main Topics  . Smoking status: Former Smoker    Quit date: 04/13/1981  . Smokeless tobacco: Never Used  . Alcohol Use: 1.2 oz/week    2 Glasses of wine per week     Comment: Occasional wine.  . Drug Use: No  . Sexual Activity: Not Asked   Other Topics Concern  . None   Social History Narrative   Lives in [place]. No children.      Retired [employer]      Diet - regular diet, herbalife     Exercise - none regular, [employer]      Right-handed.      1-2 cups caffeine daily.    Review of Systems  Unable to perform ROS: Psychiatric disorder       Objective:    BP 156/77 mmHg  Pulse 71  Temp(Src) 97.9 F (36.6 C) (Oral)  Ht 5\' 6"  (1.676 m)  Wt 170 lb 6 oz (77.282 kg)  BMI 27.51 kg/m2  SpO2 99% Physical Exam  Constitutional: She is oriented to person, place, and time. She appears well-developed and well-nourished. No distress.  HENT:  Head: Normocephalic and atraumatic.  Right Ear: External ear normal.  Left Ear: External ear normal.  Nose: Nose normal.  Mouth/Throat: Oropharynx is clear and moist. No oropharyngeal exudate.  Eyes: Conjunctivae are normal. Pupils are equal, round, and reactive to light. Right eye exhibits no discharge. Left eye exhibits no discharge. No scleral icterus.  Neck: Normal range of motion. Neck supple. No tracheal deviation present. No thyromegaly present.  Cardiovascular: Normal rate, regular rhythm, normal heart sounds and intact distal pulses.  Exam reveals no gallop and no friction rub.  No murmur heard. Pulmonary/Chest: Effort normal and breath sounds normal. No respiratory distress. She has no wheezes. She has no rales. She exhibits no tenderness.  Genitourinary: Vagina normal. There is no rash, tenderness, lesion or injury on the right labia. There is no rash, tenderness, lesion or injury on the left labia.  Musculoskeletal: Normal range of motion. She exhibits no edema or tenderness.  Lymphadenopathy:    She has no cervical adenopathy.  Neurological: She is alert and oriented to person, place, and time. No cranial nerve deficit. She exhibits normal muscle tone. Coordination normal.  Skin: Skin is warm and dry. No rash noted. She is not diaphoretic. No erythema. No pallor.  Psychiatric: She has a normal mood and affect. Her behavior is normal. Judgment and thought content normal.          Assessment &  Plan:  Over 41min of which >50% spent in face-to-face contact with patient discussing plan of care  Problem List Items Addressed This Visit      Unprioritized   Conversion disorder - Primary    Active hallucination. I feel that she is a potential threat to herself and others, given that she has been talking about using a gun to caregiver at home. Recommended urgent psych admission. Pt agreed to go to ER for "specialist evaluation."  Called ER and spoke with charge nurse.      Relevant Orders   Ambulatory referral to Psychiatry   Paresthesias       Return in about 4 weeks (around 08/26/2015).

## 2015-07-29 NOTE — ED Notes (Signed)
Patient denies having any guns, knives or any other weapons in the home.

## 2015-07-29 NOTE — ED Provider Notes (Signed)
[facility] Emergency Department Provider Note  ____________________________________________  Time seen: Approximately 1:28 PM  I have reviewed the triage vital signs and the nursing notes.   HISTORY  Chief Complaint Hallucinations    HPI Pamela Norman is a 74 y.o. female who reports she came to the emergency room for evaluation by a specialist regarding her nerves being crossed, and spine and body alignment being abnormal.  I discussed with her, that her primary care doctor and referred her here for concerns of statements about a gun and potentially being in her head. The patient absolutely denies this, and becomes rather agitated when discussing. She absolutely denies that she would hurt herself or anyone else. She states she is here because her body is out of alignment.  She is in no pain, but feels as though her "vagina isn't aligned."  Discussed with Dr. Gilford Rile who recommended the patient be committed to the hospital because of report from the patient's friend at the clinic.   Past Medical History  Diagnosis Date  . Heart murmur   . Hyperlipidemia   . Squamous cell carcinoma     face  . Cataract   . Endometrial cancer     Grade 1  . History of colonoscopy 2014    within normal limits  . History of mammogram 2015  . History of CVA (cerebrovascular accident)   . Rectal prolapse   . Anxiety   . History of ovarian cyst 01/2015    Patient Active Problem List   Diagnosis Date Noted  . Conversion disorder 05/26/2015  . Paresthesias 05/25/2015  . Cervicalgia 05/11/2015  . Endometrial cancer 05/04/2015  . Other and unspecified hyperlipidemia 05/21/2014  . Elevated blood sugar 05/21/2014  . Medicare annual wellness visit, subsequent 05/19/2013  . Screening for breast cancer 04/13/2013  . Special screening for malignant neoplasms, colon 04/13/2013    Past Surgical History  Procedure Laterality Date  . Appendectomy    . Tonsilectomy,  adenoidectomy, bilateral myringotomy and tubes    . Combined hysteroscopy diagnostic / d&c  2016  . Laparoscopic hysterectomy  03/30/15  . Bilateral salpingoophorectomy  03/30/15  . Dnc    . Dilation and curettage of uterus    . Abdominal hysterectomy    . Tonsillectomy      Current Outpatient Rx  Name  Route  Sig  Dispense  Refill  . diazepam (VALIUM) 5 MG tablet   Oral   Take 1 tablet (5 mg total) by mouth daily as needed. Patient taking differently: Take 5 mg by mouth daily as needed for anxiety.    30 tablet   0   . DULoxetine (CYMBALTA) 30 MG capsule   Oral   Take 60 mg by mouth daily.         Marland Kitchen gabapentin (NEURONTIN) 100 MG capsule   Oral   Take 100 mg by mouth 3 (three) times daily.         . meloxicam (MOBIC) 15 MG tablet   Oral   Take 1 tablet (15 mg total) by mouth daily.   30 tablet   0     Allergies Review of patient's allergies indicates no known allergies.  Family History  Problem Relation Age of Onset  . Colon cancer Father     older age onset  . COPD Father   . COPD Mother     Social History Social History  Substance Use Topics  . Smoking status: Former Audiological scientist  date: 04/13/1981  . Smokeless tobacco: Never Used  . Alcohol Use: 1.2 oz/week    2 Glasses of wine per week     Comment: Occasional wine.    Review of Systems Constitutional: No fever/chills Eyes: No visual changes. ENT: No sore throat. Cardiovascular: Denies chest pain. Respiratory: Denies shortness of breath. Gastrointestinal: No abdominal pain.  No nausea, no vomiting.  No diarrhea.  No constipation. Genitourinary: Negative for dysuria. Musculoskeletal: Negative for back pain. Skin: Negative for rash. Neurological: Negative for headaches, focal weakness or numbness.  Again, patient states that she feels like her entire body is out of alignment.  10-point ROS otherwise negative.  ____________________________________________   PHYSICAL EXAM:  VITAL  SIGNS: ED Triage Vitals  Enc Vitals Group     BP 07/29/15 1234 162/98 mmHg     Pulse Rate 07/29/15 1234 75     Resp 07/29/15 1234 18     Temp 07/29/15 1234 98.5 F (36.9 C)     Temp Source 07/29/15 1234 Oral     SpO2 07/29/15 1234 100 %     Weight 07/29/15 1234 171 lb (77.565 kg)     Height 07/29/15 1234 5\' 6"  (1.676 m)     Head Cir --      Peak Flow --      Pain Score --      Pain Loc --      Pain Edu? --      Excl. in [place]? --     Constitutional: Alert and oriented. Well appearing and in no acute distress. Somewhat anxious, and a little bit agitated after our initial conversation about referral for psychiatric evaluation. She is very well oriented. Eyes: Conjunctivae are normal. PERRL. EOMI. Head: Atraumatic. Nose: No congestion/rhinnorhea. Mouth/Throat: Mucous membranes are moist.  Oropharynx non-erythematous. Neck: No stridor.   Cardiovascular: Normal rate, regular rhythm. Grossly normal heart sounds.  Good peripheral circulation. Respiratory: Normal respiratory effort.  No retractions. Lungs CTAB. Gastrointestinal: Soft and nontender. No distention. No abdominal bruits. No CVA tenderness. Musculoskeletal: No lower extremity tenderness nor edema.  No joint effusions. Neurologic:  Normal speech and language. No gross focal neurologic deficits are appreciated. No gait instability. Skin:  Skin is warm, dry and intact. No rash noted. Psychiatric: Mood and affect are normal. Speech and behavior are normal.  ____________________________________________   LABS (all labs ordered are listed, but only abnormal results are displayed)  Labs Reviewed  COMPREHENSIVE METABOLIC PANEL - Abnormal; Notable for the following:    Glucose, Bld 142 (*)    Total Protein 8.2 (*)    All other components within normal limits  URINALYSIS COMPLETEWITH MICROSCOPIC ([facility] ONLY) - Abnormal; Notable for the following:    Color, Urine STRAW (*)    APPearance CLEAR (*)    Bacteria, UA RARE (*)     Squamous Epithelial / LPF 0-5 (*)    All other components within normal limits  CBC WITH DIFFERENTIAL/PLATELET  LIPASE, BLOOD   ____________________________________________  EKG   ____________________________________________  RADIOLOGY   ____________________________________________   PROCEDURES  Procedure(s) performed: None  Critical Care performed: No  ____________________________________________   INITIAL IMPRESSION / ASSESSMENT AND PLAN / ED COURSE  Pertinent labs & imaging results that were available during my care of the patient were reviewed by me and considered in my medical decision making (see chart for details).  Patient presents for psychiatric evaluation. Her exam certainly demonstrates nothing to suggest any sort of abnormal alignment, but I do believe she may have  some type of fixed delusion. She absolutely denies any thoughts of hurting herself, hallucinations, or desire to hurt others.  She is on IVC, and we will have psychiatry evaluate her.  Discussion with Dr. Gilford Rile, the patient has had previous evaluations for psychiatric abnormalities and is had a recent CT of the head which was normal, B12 levels, and further workup at her office.  Ongoing care and disposition assigned to Dr. Reita Cliche. Urinalysis and psychiatric consultation pending. Patient under IVC. ____________________________________________   FINAL CLINICAL IMPRESSION(S) / ED DIAGNOSES  Final diagnoses:  Delusion      Delman Kitten, MD 07/29/15 1538

## 2015-07-29 NOTE — ED Notes (Signed)
BEHAVIORAL HEALTH ROUNDING Patient sleeping: No. Patient alert and oriented: no Behavior appropriate: No.; If no, describe: slightly agitated, adamant that things are "turned around down there" Nutrition and fluids offered: Yes  Toileting and hygiene offered: Yes  Sitter present: no Law enforcement present: Yes

## 2015-07-29 NOTE — ED Notes (Signed)
BEHAVIORAL HEALTH ROUNDING Patient sleeping: No. Patient alert and oriented: yes Behavior appropriate: No.; If no, describe: Continues to pace around room, explaining story again about being twisted down there and malaligined. Nutrition and fluids offered: Yes  Toileting and hygiene offered: Yes  Sitter present: yes Law enforcement present: Yes

## 2015-07-29 NOTE — ED Notes (Signed)
BEHAVIORAL HEALTH ROUNDING Patient sleeping: No. Patient alert and oriented: yes Behavior appropriate: No.; If no, describe: continues to pace in room, does not want to do EKG thinks its unnecessary, did not come to ER to be seen, came to see a specialist. Nutrition and fluids offered: Yes meal and drink given Toileting and hygiene offered: Yes  Sitter present: yes Law enforcement present: Yes

## 2015-07-29 NOTE — ED Notes (Signed)
Assisted patient up to move around in the room at her request.

## 2015-07-30 DIAGNOSIS — F22 Delusional disorders: Secondary | ICD-10-CM | POA: Diagnosis not present

## 2015-07-30 MED ORDER — ACETAMINOPHEN 325 MG PO TABS
650.0000 mg | ORAL_TABLET | Freq: Once | ORAL | Status: AC
  Administered 2015-07-30 – 2015-07-31 (×2): 650 mg via ORAL
  Filled 2015-07-30: qty 2

## 2015-07-30 MED ORDER — DIAZEPAM 5 MG PO TABS
5.0000 mg | ORAL_TABLET | Freq: Three times a day (TID) | ORAL | Status: DC | PRN
  Administered 2015-07-30: 5 mg via ORAL
  Filled 2015-07-30: qty 1

## 2015-07-30 MED ORDER — GABAPENTIN 100 MG PO CAPS
100.0000 mg | ORAL_CAPSULE | Freq: Once | ORAL | Status: AC
  Administered 2015-07-30: 100 mg via ORAL
  Filled 2015-07-30: qty 1

## 2015-07-30 MED ORDER — GABAPENTIN 100 MG PO CAPS
100.0000 mg | ORAL_CAPSULE | Freq: Three times a day (TID) | ORAL | Status: DC
  Administered 2015-07-30 – 2015-08-01 (×8): 100 mg via ORAL
  Filled 2015-07-30 (×6): qty 1

## 2015-07-30 MED ORDER — DULOXETINE HCL 30 MG PO CPEP: 30.0000 mg | ORAL_CAPSULE | Freq: Two times a day (BID) | ORAL | Status: DC

## 2015-07-30 MED ORDER — DULOXETINE HCL 30 MG PO CPEP
30.0000 mg | ORAL_CAPSULE | Freq: Two times a day (BID) | ORAL | Status: DC
  Administered 2015-07-30 – 2015-08-01 (×5): 30 mg via ORAL
  Filled 2015-07-30 (×10): qty 1

## 2015-07-30 NOTE — ED Notes (Signed)
Pt continues to walk back and forth in her room.  RN asked if pt needed anything and pt shook her head "no", but then she said she needed to go home and wanted to know why everything was taking so long.  RN attempted to reassure pt by telling her that she was doing well and there was a team of health care professionals working in her behalf.

## 2015-07-30 NOTE — ED Notes (Signed)
RN takes medication to pts in a small cup.  Each time RN took medication to pt today, RN would explain that the cup is to be used by the pt to pour the medication into the pt's own mouth; however,  the pt would take the medication out of the cup with her fingers.  This 1600 time, the cup vs fingers debate was engaged again; but, this time, the pt looked at the RN and said "Now, what was that again about the cup?"  The RN held the cup and pantomimed pouring the medicine into her mouth and then slapping the cup down on the pt's bed.  For some reason, the pt found this funny and she began to really laugh.  All day, the pt has been subdued and withdrawn, but, suddenly, she was laughing with a big grin.

## 2015-07-30 NOTE — ED Notes (Signed)

## 2015-07-30 NOTE — ED Notes (Signed)
BEHAVIORAL HEALTH ROUNDING Patient sleeping: No. Patient alert and oriented: alert; not oriented Behavior appropriate: Yes.  ; If no, describe:  Nutrition and fluids offered: Yes  Toileting and hygiene offered: No Sitter present: not applicable Law enforcement present: Yes

## 2015-07-30 NOTE — ED Notes (Signed)
RN asked pt is her pain/discomfort was better in her legs and pt said "yes" but she couldn't give a pain score.

## 2015-07-30 NOTE — ED Notes (Signed)
Dinner served

## 2015-07-30 NOTE — BHH Counselor (Signed)
This Probation officer spoke with Shirlee Limerick from [facility] who reports pt referral information has not been reviewed as of yet, due to no bed availability until Monday (08/01/15).

## 2015-07-30 NOTE — ED Notes (Signed)
Pt's friend, Abelina Bachelor, came to visit pt and pt's brother from [place] came with her.  Ms. Gloriann Loan requested that she and the pt's brother speak with the social worker.  SW called by RN and SW agreed to meet with family.  After Ms. Bell's visit with pt, she returned to the lobby and the pt's brother.  They are waiting there for the SW.  RN gave Ms Gloriann Loan RN's name and extension #, if pt's visitors have questions/concerns or need help.   Ms Gloriann Loan stated that, although pt and her brother care about each other and brother is here out of concern for pt, they are not close and haven't been throughout adulthood.  They see each other only on major holidays.  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  Ms Gloriann Loan said that pt is living only a block from the house of her childhood.  Although Ms Gloriann Loan and pt are not directly related, they do share a niece and nephew..  Per Ms Gloriann Loan, pt is angry with her because Ms Gloriann Loan has spoken to health care providers about pt's anxiety. Ms Gloriann Loan states that pt has had anxiety and panic attacks for months now.

## 2015-07-30 NOTE — ED Notes (Signed)
Ms. Gloriann Loan and pt's brother ask to meet with RN after they spoke with SW.  They were concerned about pt's medication regimen and whether or not she is "on" anything here and, if so, is she taking "it."  RN reassured them that pt is on a regimen here and she has been taking her prescribed medication as directed.  They also expressed concern about pt knowing what the plans are for her.  They felt that, if pt knew she was not going home from here, she would be upset.  RN said that we would take good care of pt even if she did become upset at some point.  Ms Gloriann Loan and pt's brother seemed satisfied and more relaxed than when they came.

## 2015-07-30 NOTE — ED Notes (Addendum)
Breakfast served

## 2015-07-30 NOTE — ED Notes (Signed)
BEHAVIORAL HEALTH ROUNDING Patient sleeping: Yes.   Patient alert and oriented: yes Behavior appropriate: Yes.  ; If no, describe:  Nutrition and fluids offered: Yes  Toileting and hygiene offered: Yes  Sitter present: No Law enforcement present: Yes

## 2015-07-30 NOTE — ED Notes (Signed)
Pt continues to talk about medication not fixing the problem and she wasn't supposed to be here.  She was being held here against her will.  If we would just fix the problem, she would not have to be here.

## 2015-07-30 NOTE — ED Notes (Signed)
BEHAVIORAL HEALTH ROUNDING Patient sleeping: Yes.   Patient alert and oriented: yes Behavior appropriate: Yes.  ; If no, describe:  Nutrition and fluids offered: Yes  Toileting and hygiene offered: Yes  Sitter present: no Law enforcement present: No

## 2015-07-30 NOTE — ED Notes (Signed)
RN administered 650 mg acetaminophen to pt.  Pt asked what medication RN was giving her.  Pt said that "medicine won't help."  RN explained that the medicine was for pt's "discomfort."  Pt took the medication.  Pt continues to pace back and forth in her room.

## 2015-07-30 NOTE — ED Notes (Signed)
BEHAVIORAL HEALTH ROUNDING  Patient sleeping: Yes.  Patient alert and oriented: no  Behavior appropriate: Yes. ; If no, describe:  Nutrition and fluids offered: No  Toileting and hygiene offered: No  Sitter present: no  Law enforcement present: Yes   

## 2015-07-30 NOTE — ED Notes (Signed)
Pain assessment:  Pt unable to give pain score.  During attempted assessment, pt walked back and forth, held her head down and spoke towards the floor.  She mumbled most of her words, making it difficult to understand her.  When asked directly if she had pain, she shook her head in the negative.  When asked if anything was uncomfortable or hurting her, she indicated it was.  When asked to show the RN, she would rub her thighs and touch her genital area.  She said  "everything is twisted around" several times.  She said "I wanted the gynecologist to take a look but nobody is helping me."  RN attempted to reassure pt that we heard her complaints and would take good care of her.

## 2015-07-30 NOTE — ED Notes (Signed)
Lunch served

## 2015-07-30 NOTE — ED Notes (Signed)
BEHAVIORAL HEALTH ROUNDING Patient sleeping: No. Patient alert and oriented: alert; not oriented Behavior appropriate: Yes.  ; If no, describe:  Nutrition and fluids offered: Yes  Toileting and hygiene offered: Yes  Sitter present: not applicable Law enforcement present: Yes 33

## 2015-07-30 NOTE — ED Provider Notes (Signed)
-----------------------------------------   6:41 AM on 07/30/2015 -----------------------------------------   Blood pressure 160/80, pulse 68, temperature 98.1 F (36.7 C), temperature source Oral, resp. rate 18, height 5\' 6"  (1.676 m), weight 171 lb (77.565 kg), SpO2 100 %.  The patient had no acute events since last update.  Calm and cooperative at this time.  Disposition is pending per Psychiatry/Behavioral Medicine team recommendations.     Paulette Blanch, MD 07/30/15 [phone]

## 2015-07-30 NOTE — ED Notes (Signed)
BEHAVIORAL HEALTH ROUNDING Patient sleeping: No. Patient alert and oriented: yes Behavior appropriate: Yes.  ; If no, describe:  Nutrition and fluids offered: Yes  Toileting and hygiene offered: Yes  Sitter present: no Law enforcement present: Yes  

## 2015-07-30 NOTE — ED Notes (Signed)
BEHAVIORAL HEALTH ROUNDING Patient sleeping: No. Patient alert and oriented: alert; not oriented Behavior appropriate: Yes.  ; If no, describe:  Nutrition and fluids offered: Yes  Toileting and hygiene offered: Yes  Sitter present: not applicable Law enforcement present: Yes

## 2015-07-30 NOTE — ED Notes (Addendum)
BEHAVIORAL HEALTH ROUNDING Patient sleeping: No. Patient alert and oriented: alert; not oriented Behavior appropriate: Yes.  ; If no, describe:  Nutrition and fluids offered: Yes  Toileting and hygiene offered: Yes  Sitter present: not applicable Law enforcement present: Yes

## 2015-07-30 NOTE — BHH Counselor (Signed)
This Probation officer spoke with this pt's family (brother and friend) who expressed concerns for pt's recent behaviors. Pt's friend (Diane) reports pt began exhibiting these delusional behaviors in January 2016; "things have really gotten bad in the last month". This Probation officer inquired about pt's past compliance with her medications, family reports "she'll tell you she's not taking her medication". Family further explained, "there is a problem but we don't what it is ... She is saying things are wrapping around her head, down her arms, and wraps around her body". Pt has a neuro consultation scheduled with a neuro surgeon on 08/22/15 [facility]), pt's family hopes this may help pt. with presenting problem.

## 2015-07-30 NOTE — BHH Counselor (Signed)
Per Gerald Stabs, pt was denied inpatient treatment by Dr. Arnette Norris with [facility] who reports pt "does not meet criteria".

## 2015-07-31 ENCOUNTER — Emergency Department: Payer: Medicare Other

## 2015-07-31 DIAGNOSIS — Z0389 Encounter for observation for other suspected diseases and conditions ruled out: Secondary | ICD-10-CM | POA: Diagnosis not present

## 2015-07-31 DIAGNOSIS — F22 Delusional disorders: Secondary | ICD-10-CM | POA: Diagnosis not present

## 2015-07-31 MED ORDER — ACETAMINOPHEN 325 MG PO TABS
ORAL_TABLET | ORAL | Status: AC
Start: 2015-07-31 — End: 2015-07-31
  Administered 2015-07-31: 650 mg via ORAL
  Filled 2015-07-31: qty 2

## 2015-07-31 MED ORDER — ACETAMINOPHEN 325 MG PO TABS: 650.0000 mg | ORAL_TABLET | Freq: Once | ORAL | Status: DC

## 2015-07-31 NOTE — ED Notes (Signed)
Pt's friend, Ms Gloriann Loan, has called to speak with RN for progress report and now she is speaking with the pt.

## 2015-07-31 NOTE — Progress Notes (Signed)
This Probation officer spoke with Shirlee Limerick with [facility] who has accepted this patient pending chest xray and EKG results.  The patient has a pending being on 8/22 once results are confirmed by receiving facility.  TTS will follow up in the AM.  This writer informed Clarise Cruz, RN of the need for testing results for placement and she agreed.      Chesley Noon, MSW, [facility] Triage Specialist [phone]

## 2015-07-31 NOTE — ED Notes (Signed)
BEHAVIORAL HEALTH ROUNDING Patient sleeping: Yes.   Patient alert and oriented: yes Behavior appropriate: Yes.  ; If no, describe:  Nutrition and fluids offered: Yes  Toileting and hygiene offered: Yes  Sitter present: no Law enforcement present: Yes  

## 2015-07-31 NOTE — ED Notes (Signed)
BEHAVIORAL HEALTH ROUNDING Patient sleeping: No. Patient alert and oriented: alert; not oriented Behavior appropriate: Yes.  ; If no, describe:  Nutrition and fluids offered: Yes  Toileting and hygiene offered: Yes  Sitter present: not applicable Law enforcement present: Yes

## 2015-07-31 NOTE — ED Notes (Signed)
Pt walking in her room.

## 2015-07-31 NOTE — ED Notes (Signed)
Pt is smiling this morning as she describes her headache and she is able to give RN a pain score unlike yesterday when she couldn't.  She pats her head all over and explains that she tried to "fix" it but she can't.  She rubs both arms from shoulder to hands and again states that everything is "swirling all around."  RN attempted to reassure pt that we would take good care of her and get something for her headache.  Breakfast was served and RN attempted to get pt interested in the [place] toast.

## 2015-07-31 NOTE — ED Notes (Signed)
BEHAVIORAL HEALTH ROUNDING Patient sleeping: Yes.   Patient alert and oriented: not applicable Behavior appropriate: Yes.  ; If no, describe:  Nutrition and fluids offered: No Toileting and hygiene offered: No Sitter present: not applicable Law enforcement present: Yes  

## 2015-07-31 NOTE — ED Notes (Signed)
BEHAVIORAL HEALTH ROUNDING Patient sleeping: No. Patient alert and oriented: alert, not oriented Behavior appropriate: Yes.  ; If no, describe:  Nutrition and fluids offered: Yes  Toileting and hygiene offered: Yes  Sitter present: not applicable Law enforcement present: Yes 33

## 2015-07-31 NOTE — ED Notes (Signed)

## 2015-07-31 NOTE — ED Notes (Signed)
Pt OOB to bathroom

## 2015-07-31 NOTE — ED Notes (Signed)
BEHAVIORAL HEALTH ROUNDING Patient sleeping: Yes.   Patient alert and oriented: not applicable SLEEPING Behavior appropriate: Yes.  ; If no, describe: SLEEPING Nutrition and fluids offered: No SLEEPING Toileting and hygiene offered: NoSLEEPING Sitter present: not applicable Law enforcement present: Yes ODS 

## 2015-07-31 NOTE — ED Notes (Signed)
When taking the acetaminophen, pt explained to RN that her headache was because everything was "twisted up" and she hadn't been able to "untwist" anything in her neck and shoulders. (Ms Pamela Norman, pt's friend, told RN that pt had been diagnosed with deterioration of the C4 or C5 vertebra.)

## 2015-07-31 NOTE — ED Notes (Signed)
Pt agreed that her head didn't hurt anymore but everything is still "twisted" and she hasn't been able to do anything about it.

## 2015-07-31 NOTE — ED Notes (Signed)
BEHAVIORAL HEALTH ROUNDING Patient sleeping: No. Patient alert and oriented: yes Behavior appropriate: Yes.  ; If no, describe:  Nutrition and fluids offered: Yes  Toileting and hygiene offered: Yes  Sitter present: no Law enforcement present: Yes  

## 2015-07-31 NOTE — ED Notes (Signed)
Lunch served

## 2015-07-31 NOTE — ED Notes (Signed)
PT REMAINS IVC, ALL CONSULTS COMPLETE, PENDING GERI PSYCH PLACEMENT

## 2015-07-31 NOTE — ED Provider Notes (Signed)
-----------------------------------------   6:22 AM on 07/31/2015 -----------------------------------------   Blood pressure 136/79, pulse 69, temperature 98.4 F (36.9 C), temperature source Oral, resp. rate 18, height 5\' 6"  (1.676 m), weight 171 lb (77.565 kg), SpO2 96 %.  The patient had no acute events since last update.  Calm and cooperative at this time.  Disposition is pending per Psychiatry/Behavioral Medicine team recommendations.     Paulette Blanch, MD 07/31/15 [phone]

## 2015-07-31 NOTE — ED Notes (Signed)
Pt appeared to be sleeping, but, when RN entered the room, pt stirred, looked at RN, and said good morning.

## 2015-07-31 NOTE — ED Notes (Signed)
Pt seems much more relaxed today.  Although she has been out of her bed several times today, for the most part she is resting in her bed.

## 2015-07-31 NOTE — ED Notes (Signed)
Pt had her eyes closed but she is awake.  RN asked if pt was OK and pt replied that everything was "twisted up."  RN turned on [place] for pt.

## 2015-08-01 ENCOUNTER — Ambulatory Visit: Payer: Medicare Other | Admitting: Radiation Oncology

## 2015-08-01 DIAGNOSIS — K5901 Slow transit constipation: Secondary | ICD-10-CM | POA: Diagnosis not present

## 2015-08-01 DIAGNOSIS — K644 Residual hemorrhoidal skin tags: Secondary | ICD-10-CM | POA: Diagnosis not present

## 2015-08-01 DIAGNOSIS — Z9049 Acquired absence of other specified parts of digestive tract: Secondary | ICD-10-CM | POA: Diagnosis present

## 2015-08-01 DIAGNOSIS — E559 Vitamin D deficiency, unspecified: Secondary | ICD-10-CM | POA: Diagnosis present

## 2015-08-01 DIAGNOSIS — E785 Hyperlipidemia, unspecified: Secondary | ICD-10-CM | POA: Diagnosis not present

## 2015-08-01 DIAGNOSIS — Z9079 Acquired absence of other genital organ(s): Secondary | ICD-10-CM | POA: Diagnosis present

## 2015-08-01 DIAGNOSIS — Z9089 Acquired absence of other organs: Secondary | ICD-10-CM | POA: Diagnosis present

## 2015-08-01 DIAGNOSIS — Z87891 Personal history of nicotine dependence: Secondary | ICD-10-CM | POA: Diagnosis not present

## 2015-08-01 DIAGNOSIS — Z8589 Personal history of malignant neoplasm of other organs and systems: Secondary | ICD-10-CM | POA: Diagnosis not present

## 2015-08-01 DIAGNOSIS — F22 Delusional disorders: Secondary | ICD-10-CM | POA: Diagnosis not present

## 2015-08-01 DIAGNOSIS — F29 Unspecified psychosis not due to a substance or known physiological condition: Secondary | ICD-10-CM | POA: Diagnosis not present

## 2015-08-01 DIAGNOSIS — Z79899 Other long term (current) drug therapy: Secondary | ICD-10-CM | POA: Diagnosis not present

## 2015-08-01 DIAGNOSIS — F419 Anxiety disorder, unspecified: Secondary | ICD-10-CM | POA: Diagnosis not present

## 2015-08-01 DIAGNOSIS — Z791 Long term (current) use of non-steroidal anti-inflammatories (NSAID): Secondary | ICD-10-CM | POA: Diagnosis not present

## 2015-08-01 DIAGNOSIS — K623 Rectal prolapse: Secondary | ICD-10-CM | POA: Diagnosis present

## 2015-08-01 DIAGNOSIS — F23 Brief psychotic disorder: Secondary | ICD-10-CM | POA: Diagnosis not present

## 2015-08-01 DIAGNOSIS — Z8673 Personal history of transient ischemic attack (TIA), and cerebral infarction without residual deficits: Secondary | ICD-10-CM | POA: Diagnosis not present

## 2015-08-01 DIAGNOSIS — F449 Dissociative and conversion disorder, unspecified: Secondary | ICD-10-CM | POA: Diagnosis not present

## 2015-08-01 NOTE — ED Notes (Signed)
Pt released into [facility] custody for transport to [facility].

## 2015-08-01 NOTE — BH Specialist Note (Signed)
[name] has been admitted to [facility].  She was accepted by Dr. Anastasia Fiedler.  Please call report to [phone].  She is to be accepted after lunch. [facility] staff will call when they are ready for her to arrive.

## 2015-08-01 NOTE — ED Notes (Signed)

## 2015-08-01 NOTE — ED Notes (Signed)
BEHAVIORAL HEALTH ROUNDING Patient sleeping: No. Patient alert and oriented: yes Behavior appropriate: Yes.  ; If no, describe:  Nutrition and fluids offered: Yes  Toileting and hygiene offered: Yes  Sitter present: no Law enforcement present: Yes  

## 2015-08-01 NOTE — ED Notes (Signed)
BEHAVIORAL HEALTH ROUNDING Patient sleeping: No. Patient alert and oriented: yes Behavior appropriate: Yes.  ; If no, describe:  Nutrition and fluids offered: Yes  Toileting and hygiene offered: Yes  Sitter present: yes Law enforcement present: Yes   Possible placement with [facility].

## 2015-08-01 NOTE — BHH Counselor (Signed)
Writer called [facility] ([phone]) to confirm the patient has a bed and can latter in the afternoon. They are waiting on the discharges to occur.

## 2015-08-01 NOTE — ED Notes (Signed)
BEHAVIORAL HEALTH ROUNDING Patient sleeping: Yes.   Patient alert and oriented: yes Behavior appropriate: Yes.  ; If no, describe:  Nutrition and fluids offered: Yes  Toileting and hygiene offered: Yes  Sitter present: no Law enforcement present: Yes  

## 2015-08-01 NOTE — BHH Counselor (Signed)
Received phone call from [facility]([phone]) stating the patient's bed is available at this time.   Pt. has been accepted to [facility]. Accepting physician is Dr. Anastasia Fiedler. Call report to [phone]. Representative was [employer]. ER Staff Lattie Haw, ER Sect.; Dr. Marcelene Butte, ER MD & Hildred Alamin Patient's Nurse) have been made aware it.

## 2015-08-01 NOTE — Consult Note (Signed)
[facility] Face-to-Face Psychiatry Consult   Reason for Consult:  Consult for this 74 year old woman with a history of oddly presenting physical complaints which I previously diagnosed as a conversion disorder. She was sent here from her primary care doctor's office with instructions that she was to be under involuntary commitment Referring Physician:  Quale Patient Identification: Pamela Norman MRN:  [id number] Principal Diagnosis: Conversion disorder Diagnosis:   Patient Active Problem List   Diagnosis Date Noted  . Conversion disorder [F44.9] 05/26/2015  . Paresthesias [R20.2] 05/25/2015  . Cervicalgia [M54.2] 05/11/2015  . Endometrial cancer [C54.1] 05/04/2015  . Other and unspecified hyperlipidemia [E78.5] 05/21/2014  . Elevated blood sugar [R73.09] 05/21/2014  . Medicare annual wellness visit, subsequent [Z00.00] 05/19/2013  . Screening for breast cancer [Z12.39] 04/13/2013  . Special screening for malignant neoplasms, colon [Z12.11] 04/13/2013    Total Time spent with patient: 1 hour  Subjective:   Pamela Norman is a 74 y.o. female patient admitted with "I am here under false pretenses".  HPI:  Information from the patient and the chart. Case reviewed with emergency room doctor. Patient familiar to me from previous hospital visits. This 74 year old woman has a chronic belief that she has multiple strangely described medical problems. She is minimizing them in conversation with me but I am aware that she believes at times that the organs inside her body have been turned backwards, apparently she told her primary care doctor that there was something wrong with her vagina or internal organs. She complains to me of her neck pain and feeling like there is something off balance about her body. It's reported in the commitment paperwork that she has stated that she felt like there was a gun being placed to her head. Friends and primary care doctors seem too of been more upset recently about her  presentation of her symptoms. She refuses to accept reassurance that there is nothing physically wrong with her. Patient denies to me that she has any suicidal thoughts.  Past psychiatric history: I have evaluated her previously in the emergency room at full laying and concluded that she has a conversion disorder. She has multiple strange beliefs about her physical condition that are resistant to any evidence. We don't have any proof however that she has any history of suicidality or violence.  Substance abuse history: Does not drink or abuse any other drugs no known history of it.  Medical history: Does have a history of multiple medical problems including a past history of endometrial cancer, elevated blood sugars hyperlipidemia.  Social history: Patient lives alone. She is a retired [employer] administrator. Seems to have a fairly active social life normally.  Follow-up as of Monday. This 74 year old woman with what appears to be either a conversion disorder or delusional disorder with somatic features has no new complaints today. Behavior has been stable. She continues to minimize her complaints to me although she will still give some detail when pressed. Has not been threatening or violent. She has been accepted to [facility] and we are planning to transfer today. HPI Elements:   Quality:  confusion and possible delusions with odd physical beliefs. Severity:  moderate but persistent. Timing:  seems to been going on for at least many months. Duration:  ongoing. Context:  no clear cause.  Past Medical History:  Past Medical History  Diagnosis Date  . Heart murmur   . Hyperlipidemia   . Squamous cell carcinoma     face  . Cataract   .  Endometrial cancer     Grade 1  . History of colonoscopy 2014    within normal limits  . History of mammogram 2015  . History of CVA (cerebrovascular accident)   . Rectal prolapse   . Anxiety   . History of ovarian cyst 01/2015    Past Surgical  History  Procedure Laterality Date  . Appendectomy    . Tonsilectomy, adenoidectomy, bilateral myringotomy and tubes    . Combined hysteroscopy diagnostic / d&c  2016  . Laparoscopic hysterectomy  03/30/15  . Bilateral salpingoophorectomy  03/30/15  . Dnc    . Dilation and curettage of uterus    . Abdominal hysterectomy    . Tonsillectomy     Family History:  Family History  Problem Relation Age of Onset  . Colon cancer Father     older age onset  . COPD Father   . COPD Mother    Social History:  History  Alcohol Use  . 1.2 oz/week  . 2 Glasses of wine per week    Comment: Occasional wine.     History  Drug Use No    Social History   Social History  . Marital Status: Single    Spouse Name: N/A  . Number of Children: 0  . Years of Education: PhD   Occupational History  . Retired    Social History Main Topics  . Smoking status: Former Smoker    Quit date: 04/13/1981  . Smokeless tobacco: Never Used  . Alcohol Use: 1.2 oz/week    2 Glasses of wine per week     Comment: Occasional wine.  . Drug Use: No  . Sexual Activity: Not Asked   Other Topics Concern  . None   Social History Narrative   Lives in [place]. No children.      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      1-2 cups caffeine daily.   Additional Social History:    Pain Medications: None Reported Prescriptions: None Reported Over the Counter: None Reported History of alcohol / drug use?: No history of alcohol / drug abuse (None Reported) Longest period of sobriety (when/how long): None Reported Negative Consequences of Use:  (None Reported) Withdrawal Symptoms:  (None Reported)                     Allergies:  No Known Allergies  Labs:  No results found for this or any previous visit (from the past 48 hour(s)).  Vitals: Blood pressure 157/91, pulse 64, temperature 97.8 F  (36.6 C), temperature source Oral, resp. rate 18, height 5\' 6"  (1.676 m), weight 77.565 kg (171 lb), SpO2 95 %.  Risk to Self: Suicidal Ideation: No Suicidal Intent: No Is patient at risk for suicide?: No Suicidal Plan?: No Access to Means: No What has been your use of drugs/alcohol within the last 12 months?: None Reported How many times?: 0 Other Self Harm Risks: None Reported Triggers for Past Attempts: Unknown Intentional Self Injurious Behavior: None Risk to Others: Homicidal Ideation: No Thoughts of Harm to Others: No Current Homicidal Intent: No Current Homicidal Plan: No Access to Homicidal Means: No Identified Victim: None Reported History of harm to others?: No Assessment of Violence: None Noted Violent Behavior Description: None Reported Does patient have access to weapons?: No Criminal Charges Pending?: No Does  patient have a court date: No Prior Inpatient Therapy: Prior Inpatient Therapy: No Prior Therapy Dates: n/a Prior Therapy Facilty/Provider(s): n/a Reason for Treatment: n/a Prior Outpatient Therapy: Prior Outpatient Therapy: No Prior Therapy Dates: n/a Prior Therapy Facilty/Provider(s): n/a Reason for Treatment: n/a Does patient have an ACCT team?: No Does patient have Intensive In-House Services?  : No Does patient have [facility] services? : No Does patient have P4CC services?: No  Current Facility-Administered Medications  Medication Dose Route Frequency Provider Last Rate Last Dose  . acetaminophen (TYLENOL) tablet 650 mg  650 mg Oral Once Harvest Dark, MD      . diazepam (VALIUM) tablet 5 mg  5 mg Oral Q8H PRN Paulette Blanch, MD   5 mg at 07/30/15 1037  . DULoxetine (CYMBALTA) DR capsule 30 mg  30 mg Oral BID Paulette Blanch, MD   30 mg at 08/01/15 1219  . gabapentin (NEURONTIN) capsule 100 mg  100 mg Oral TID Paulette Blanch, MD   100 mg at 08/01/15 1219   Current Outpatient Prescriptions  Medication Sig Dispense Refill  . diazepam (VALIUM) 5 MG tablet  Take 1 tablet (5 mg total) by mouth daily as needed. (Patient taking differently: Take 5 mg by mouth daily as needed for anxiety. ) 30 tablet 0  . DULoxetine (CYMBALTA) 30 MG capsule Take 60 mg by mouth daily.    Marland Kitchen gabapentin (NEURONTIN) 100 MG capsule Take 100 mg by mouth 3 (three) times daily.    . meloxicam (MOBIC) 15 MG tablet Take 1 tablet (15 mg total) by mouth daily. 30 tablet 0    Musculoskeletal: Strength & Muscle Tone: within normal limits Gait & Station: normal Patient leans: N/A  Psychiatric Specialty Exam: Physical Exam  Nursing note and vitals reviewed. Constitutional: She appears well-developed and well-nourished.  HENT:  Head: Normocephalic and atraumatic.  Eyes: Conjunctivae are normal. Pupils are equal, round, and reactive to light.  Neck: Normal range of motion.  Cardiovascular: Normal heart sounds.   Respiratory: Effort normal.  GI: Soft.  Musculoskeletal: Normal range of motion.  Neurological: She is alert.  Skin: Skin is warm and dry.  Psychiatric: Her mood appears anxious. Her speech is tangential. She is agitated. Thought content is delusional. Cognition and memory are impaired. She expresses impulsivity.    Review of Systems  Constitutional: Negative.   Eyes: Negative.   Respiratory: Negative.   Cardiovascular: Negative.   Gastrointestinal: Negative.   Musculoskeletal: Positive for neck pain.  Skin: Negative.   Neurological: Negative.   Psychiatric/Behavioral: Negative for depression, suicidal ideas, hallucinations and substance abuse. The patient is nervous/anxious.     Blood pressure 157/91, pulse 64, temperature 97.8 F (36.6 C), temperature source Oral, resp. rate 18, height 5\' 6"  (1.676 m), weight 77.565 kg (171 lb), SpO2 95 %.Body mass index is 27.61 kg/(m^2).  General Appearance: Casual  Eye Contact::  Good  Speech:  Clear and Coherent  Volume:  Normal  Mood:  Anxious  Affect:  Congruent  Thought Process:  Disorganized  Orientation:  Full  (Time, Place, and Person)  Thought Content:  Delusions  Suicidal Thoughts:  No  Homicidal Thoughts:  No  Memory:  Immediate;   Good Recent;   Good Remote;   Good  Judgement:  Impaired  Insight:  Lacking  Psychomotor Activity:  Increased  Concentration:  Poor  Recall:  [place]  Language: Fair  Akathisia:  No  Handed:  Right  AIMS (if indicated):  Assets:  Communication Skills Desire for Improvement Financial Resources/Insurance Housing Physical Health Social Support  ADL's:  Intact  Cognition: WNL  Sleep:      Medical Decision Making: Review of Psycho-Social Stressors (1), Review or order clinical lab tests (1), Established Problem, Worsening (2), Review of Medication Regimen & Side Effects (2) and Review of New Medication or Change in Dosage (2)  Treatment Plan Summary: Plan Patient has been waiting in the emergency room over the weekend. No new behavior problems. Continues to be fixated on her bizarre and unexplainable medical problems. Commitment has been continued to try and finally get some further evaluation and closure to this since it seems to be an ongoing problem. Bed is available in [facility] and she will be transferred today. Patient notified of plan.  Plan:  Recommend psychiatric Inpatient admission when medically cleared. Supportive therapy provided about ongoing stressors. Disposition: as noted above  Alethia Berthold 08/01/2015 4:51 PM

## 2015-08-01 NOTE — ED Provider Notes (Signed)
-----------------------------------------   8:35 AM on 08/01/2015 -----------------------------------------   Blood pressure 157/91, pulse 64, temperature 97.8 F (36.6 C), temperature source Oral, resp. rate 18, height 5\' 6"  (1.676 m), weight 171 lb (77.565 kg), SpO2 95 %.  The patient had no acute events since last update.  Calm and cooperative at this time.  Disposition is pending per Psychiatry/Behavioral Medicine team recommendations.     Daymon Larsen, MD 08/01/15 [phone]

## 2015-08-01 NOTE — ED Provider Notes (Signed)
Patient accepted to [facility] by Dr. Anastasia Fiedler she has been medically stable  Nena Polio, MD 08/01/15 [phone]

## 2015-08-01 NOTE — ED Notes (Signed)
BEHAVIORAL HEALTH ROUNDING Patient sleeping: No. Patient alert and oriented: yes Behavior appropriate: Yes.  ; If no, describe:  Nutrition and fluids offered: Yes  Toileting and hygiene offered: Yes  Sitter present: no Law enforcement present: Yes  ENVIRONMENTAL ASSESSMENT Potentially harmful objects out of patient reach: Yes.   Personal belongings secured: Yes.   Patient dressed in hospital provided attire only: Yes.   Plastic bags out of patient reach: Yes.   Patient care equipment (cords, cables, call bells, lines, and drains) shortened, removed, or accounted for: Yes.   Equipment and supplies removed from bottom of stretcher: Yes.   Potentially toxic materials out of patient reach: Yes.   Sharps container removed or out of patient reach: Yes.

## 2015-08-01 NOTE — BHH Counselor (Signed)
Received a phone call from patient's friend ([phone]) and updated her about the patient going to [facility].

## 2015-08-02 ENCOUNTER — Telehealth: Payer: Self-pay | Admitting: *Deleted

## 2015-08-02 DIAGNOSIS — K644 Residual hemorrhoidal skin tags: Secondary | ICD-10-CM | POA: Insufficient documentation

## 2015-08-02 DIAGNOSIS — Z8673 Personal history of transient ischemic attack (TIA), and cerebral infarction without residual deficits: Secondary | ICD-10-CM

## 2015-08-02 HISTORY — DX: Personal history of transient ischemic attack (TIA), and cerebral infarction without residual deficits: Z86.73

## 2015-08-02 NOTE — Telephone Encounter (Signed)
Per Maudie Mercury in radiation, the patient's family member called to cnl the radiation consult appointment due to the patient being in the hospital for mental health concerns and reasons. Per family members, the patient will most likely be transferred out to another facility. Family does not wish to reschedule the appointments at this time. Patient is scheduled for Wednesday for gyn oncology check. If patient is unable to keep this appointment, this appointment can be easily rescheduled to another alternative date.

## 2015-08-03 ENCOUNTER — Inpatient Hospital Stay: Payer: Medicare Other

## 2015-08-19 DIAGNOSIS — F331 Major depressive disorder, recurrent, moderate: Secondary | ICD-10-CM | POA: Diagnosis not present

## 2015-08-22 DIAGNOSIS — M47812 Spondylosis without myelopathy or radiculopathy, cervical region: Secondary | ICD-10-CM | POA: Diagnosis not present

## 2015-08-22 DIAGNOSIS — R030 Elevated blood-pressure reading, without diagnosis of hypertension: Secondary | ICD-10-CM | POA: Diagnosis not present

## 2015-08-29 ENCOUNTER — Ambulatory Visit
Admission: RE | Admit: 2015-08-29 | Discharge: 2015-08-29 | Disposition: A | Discharge status: Home / Self Care | Payer: Medicare Other | Source: Ambulatory Visit | Attending: Radiation Oncology | Admitting: Radiation Oncology

## 2015-08-29 ENCOUNTER — Encounter: Payer: Self-pay | Admitting: Radiation Oncology

## 2015-08-29 DIAGNOSIS — R011 Cardiac murmur, unspecified: Secondary | ICD-10-CM | POA: Insufficient documentation

## 2015-08-29 DIAGNOSIS — Z90722 Acquired absence of ovaries, bilateral: Secondary | ICD-10-CM | POA: Insufficient documentation

## 2015-08-29 DIAGNOSIS — Z87891 Personal history of nicotine dependence: Secondary | ICD-10-CM | POA: Insufficient documentation

## 2015-08-29 DIAGNOSIS — F419 Anxiety disorder, unspecified: Secondary | ICD-10-CM | POA: Insufficient documentation

## 2015-08-29 DIAGNOSIS — Z7982 Long term (current) use of aspirin: Secondary | ICD-10-CM | POA: Insufficient documentation

## 2015-08-29 DIAGNOSIS — Z79899 Other long term (current) drug therapy: Secondary | ICD-10-CM | POA: Insufficient documentation

## 2015-08-29 DIAGNOSIS — C541 Malignant neoplasm of endometrium: Secondary | ICD-10-CM

## 2015-08-29 DIAGNOSIS — E785 Hyperlipidemia, unspecified: Secondary | ICD-10-CM | POA: Insufficient documentation

## 2015-08-29 DIAGNOSIS — Z90710 Acquired absence of both cervix and uterus: Secondary | ICD-10-CM | POA: Insufficient documentation

## 2015-08-29 DIAGNOSIS — Z791 Long term (current) use of non-steroidal anti-inflammatories (NSAID): Secondary | ICD-10-CM | POA: Insufficient documentation

## 2015-08-29 DIAGNOSIS — Z859 Personal history of malignant neoplasm, unspecified: Secondary | ICD-10-CM | POA: Insufficient documentation

## 2015-08-29 DIAGNOSIS — Z8673 Personal history of transient ischemic attack (TIA), and cerebral infarction without residual deficits: Secondary | ICD-10-CM | POA: Insufficient documentation

## 2015-08-29 DIAGNOSIS — Z510 Encounter for antineoplastic radiation therapy: Secondary | ICD-10-CM | POA: Insufficient documentation

## 2015-08-29 NOTE — Consult Note (Signed)
Except an outstanding is perfect of Radiation Oncology NEW PATIENT EVALUATION  Name: Pamela Norman  MRN: [id number]  Date:   08/29/2015     DOB: 05-09-1941   This 74 y.o. female patient presents to the clinic for initial evaluation of high intermediate risk of endometrial carcinoma stage Ia.  REFERRING PHYSICIAN: Jackolyn Confer, MD  CHIEF COMPLAINT:  Chief Complaint  Patient presents with  . Cancer    Pateint here for initial evaluation of rad therapy for endometrial cancer.    DIAGNOSIS: The encounter diagnosis was Endometrial cancer.   PREVIOUS INVESTIGATIONS:  Pathology reports reviewed MRI scans of spine reviewed Clinical notes reviewed   HPI: Patient is a 74 year old female who presented with postmenopausal bleeding underwent a D&C which was positive for well-differentiated endometrial carcinoma. She went on to have a TAH/BSO and pelvic lymph node sampling showing endometrioid carcinoma with squamous differentiation grade 1 invading less than one half the myometrium. Cervix was not involved. Right and left fallopian tubes and ovaries were benign. Tumor invaded 4 mm of a total of 9 mm of myometrial thickness. There was positive lymphovascular invasion noted. Overall tumor size was 3.5 cm. It was decided she would benefit from vaginal brachytherapy although she her appointment with [place] has been delayed in canceled many times secondary to conversion disorder and hasn't undergone voluntary commitment. She seems to be doing well at this time. She specifically denies any vaginal discharge abdominal pain or discomfort diarrhea or dysuria. I've asked to evaluate her for again vaginal brachytherapy treatment.  PLANNED TREATMENT REGIMEN: High dose rate remote afterloading to the vaginal apex  PAST MEDICAL HISTORY:  has a past medical history of Heart murmur; Hyperlipidemia; Squamous cell carcinoma; Cataract; Endometrial cancer; History of colonoscopy (2014); History of mammogram (2015);  History of CVA (cerebrovascular accident); Rectal prolapse; Anxiety; and History of ovarian cyst (01/2015).    PAST SURGICAL HISTORY:  Past Surgical History  Procedure Laterality Date  . Appendectomy    . Tonsilectomy, adenoidectomy, bilateral myringotomy and tubes    . Combined hysteroscopy diagnostic / d&c  2016  . Laparoscopic hysterectomy  03/30/15  . Bilateral salpingoophorectomy  03/30/15  . Dnc    . Dilation and curettage of uterus    . Abdominal hysterectomy    . Tonsillectomy      FAMILY HISTORY: family history includes COPD in her father and mother; Colon cancer in her father.  SOCIAL HISTORY:  reports that she quit smoking about 34 years ago. She has never used smokeless tobacco. She reports that she drinks about 1.2 oz of alcohol per week. She reports that she does not use illicit drugs.  ALLERGIES: Review of patient's allergies indicates no known allergies.  MEDICATIONS:  Current Outpatient Prescriptions  Medication Sig Dispense Refill  . aspirin 81 MG chewable tablet Chew 81 mg by mouth.    . diazepam (VALIUM) 5 MG tablet Take 1 tablet (5 mg total) by mouth daily as needed. (Patient taking differently: Take 5 mg by mouth daily as needed for anxiety. ) 30 tablet 0  . DULoxetine (CYMBALTA) 30 MG capsule Take 60 mg by mouth daily.    . meloxicam (MOBIC) 15 MG tablet Take 1 tablet (15 mg total) by mouth daily. 30 tablet 0  . Risperidone 0.25 MG TBDP Take 1.25 mg by mouth.    . Vitamin D, Ergocalciferol, (DRISDOL) 50000 UNITS CAPS capsule Take 50,000 Units by mouth.    . gabapentin (NEURONTIN) 100 MG capsule Take 100 mg by mouth  3 (three) times daily.     No current facility-administered medications for this encounter.    ECOG PERFORMANCE STATUS:  0 - Asymptomatic  REVIEW OF SYSTEMS:  Patient denies any weight loss, fatigue, weakness, fever, chills or night sweats. Patient denies any loss of vision, blurred vision. Patient denies any ringing  of the ears or hearing loss.  No irregular heartbeat. Patient denies heart murmur or history of fainting. Patient denies any chest pain or pain radiating to her upper extremities. Patient denies any shortness of breath, difficulty breathing at night, cough or hemoptysis. Patient denies any swelling in the lower legs. Patient denies any nausea vomiting, vomiting of blood, or coffee ground material in the vomitus. Patient denies any stomach pain. Patient states has had normal bowel movements no significant constipation or diarrhea. Patient denies any dysuria, hematuria or significant nocturia. Patient denies any problems walking, swelling in the joints or loss of balance. Patient denies any skin changes, loss of hair or loss of weight. Patient denies any excessive worrying or anxiety or significant depression. Patient denies any problems with insomnia. Patient denies excessive thirst, polyuria, polydipsia. Patient denies any swollen glands, patient denies easy bruising or easy bleeding. Patient denies any recent infections, allergies or URI. Patient "s visual fields have not changed significantly in recent time.    PHYSICAL EXAM: There were no vitals taken for this visit. On speculum examination vaginal vault apex is well-healed. No vaginal mucosal lesions are identified bimanual examination shows no evidence of parametrial mass or rectal abnormality. Well-developed well-nourished patient in NAD. HEENT reveals PERLA, EOMI, discs not visualized.  Oral cavity is clear. No oral mucosal lesions are identified. Neck is clear without evidence of cervical or supraclavicular adenopathy. Lungs are clear to A&P. Cardiac examination is essentially unremarkable with regular rate and rhythm without murmur rub or thrill. Abdomen is benign with no organomegaly or masses noted. Motor sensory and DTR levels are equal and symmetric in the upper and lower extremities. Cranial nerves II through XII are grossly intact. Proprioception is intact. No peripheral  adenopathy or edema is identified. No motor or sensory levels are noted. Crude visual fields are within normal range.  LABORATORY DATA: Surgical pathology reports reviewed    RADIOLOGY RESULTS: MRI scans of spine reviewed   IMPRESSION: High intermediate risk endometrial carcinoma based on lymphovascular invasion in 74 year old female status post TAH/BSO for endometrial carcinoma.  PLAN: At this time I got over recommendations for the patient. Although the percentagewise improvement in overall survival disease-free survival are all fairly low is been definitely shown that vaginal brachytherapy can improve on those parameters. I would plan on delivering 23.5 gray to the vaginal apex in 5 fractions of high-dose rate remote afterloading using BrachyVision treatment planning. Risks and benefits of treatment including possible diarrhea, dysuria, fatigue, vaginal stenosis all were explained in detail to the patient. She seems to comprehend my treatment plan well. I have set her up later this week and ordered CT simulation for vaginal brachytherapy.  I would like to take this opportunity for allowing me to participate in the care of your patient.Armstead Peaks., MD

## 2015-08-31 ENCOUNTER — Ambulatory Visit
Admission: RE | Admit: 2015-08-31 | Discharge: 2015-08-31 | Disposition: A | Discharge status: Home / Self Care | Payer: Medicare Other | Source: Ambulatory Visit | Attending: Radiation Oncology | Admitting: Radiation Oncology

## 2015-08-31 ENCOUNTER — Inpatient Hospital Stay: Payer: Medicare Other | Attending: Obstetrics and Gynecology | Admitting: Obstetrics and Gynecology

## 2015-08-31 VITALS — BP 160/90 | HR 79 | Temp 97.5°F | Resp 18 | Ht 66.0 in | Wt 182.8 lb

## 2015-08-31 DIAGNOSIS — Z90722 Acquired absence of ovaries, bilateral: Secondary | ICD-10-CM | POA: Insufficient documentation

## 2015-08-31 DIAGNOSIS — Z8673 Personal history of transient ischemic attack (TIA), and cerebral infarction without residual deficits: Secondary | ICD-10-CM | POA: Diagnosis not present

## 2015-08-31 DIAGNOSIS — Z791 Long term (current) use of non-steroidal anti-inflammatories (NSAID): Secondary | ICD-10-CM | POA: Diagnosis not present

## 2015-08-31 DIAGNOSIS — C541 Malignant neoplasm of endometrium: Secondary | ICD-10-CM

## 2015-08-31 DIAGNOSIS — R739 Hyperglycemia, unspecified: Secondary | ICD-10-CM | POA: Diagnosis not present

## 2015-08-31 DIAGNOSIS — Z7982 Long term (current) use of aspirin: Secondary | ICD-10-CM | POA: Diagnosis not present

## 2015-08-31 DIAGNOSIS — F419 Anxiety disorder, unspecified: Secondary | ICD-10-CM | POA: Diagnosis not present

## 2015-08-31 DIAGNOSIS — Z87891 Personal history of nicotine dependence: Secondary | ICD-10-CM | POA: Diagnosis not present

## 2015-08-31 DIAGNOSIS — Z79899 Other long term (current) drug therapy: Secondary | ICD-10-CM | POA: Diagnosis not present

## 2015-08-31 DIAGNOSIS — Z90710 Acquired absence of both cervix and uterus: Secondary | ICD-10-CM | POA: Diagnosis not present

## 2015-08-31 DIAGNOSIS — Z859 Personal history of malignant neoplasm, unspecified: Secondary | ICD-10-CM | POA: Diagnosis not present

## 2015-08-31 DIAGNOSIS — Z510 Encounter for antineoplastic radiation therapy: Secondary | ICD-10-CM | POA: Diagnosis not present

## 2015-08-31 DIAGNOSIS — R011 Cardiac murmur, unspecified: Secondary | ICD-10-CM | POA: Diagnosis not present

## 2015-08-31 DIAGNOSIS — E785 Hyperlipidemia, unspecified: Secondary | ICD-10-CM | POA: Diagnosis not present

## 2015-08-31 NOTE — Progress Notes (Signed)
Assisted physician with pelvic exam. 

## 2015-08-31 NOTE — Progress Notes (Signed)
Gynecologic Oncology Interval Note  Referring Provider: Dr. Hassell Done Defrancesco  Chief Concern: Endometrial cancer surveillance  Subjective:  Pamela Norman is a 74 y.o. female with stage IA grade 1 endometrial cancer.  Please refer to prior notes for complete details. On 03/30/15 she underwent total laparoscopic hysterectomy, bilateral salpingo-oophorectomy, pelvic sentinel lymph node mapping, and repair of vaginal lacerations caused by removal of the uterus transvaginally.  Her final pathology as noted below.  Sentinel nodes were not identified and node dissection not done since frozen showed grade 1 tumor invading less than 50%.  Since final path showed LVSI she was referred to Dr. Baruch Gouty for vaginal brachytherapy.   No complaints today.    Surgical Procedure  CASE: [id number]  PATIENT: Pamela Norman  Surgical Pathology Report      SPECIMEN SUBMITTED:  A. Uterus, cervix, and bilateral tubes and ovaries   CLINICAL HISTORY:  None provided   PRE-OPERATIVE DIAGNOSIS:  grade 1 endometrioid endometrial cancer   POST-OPERATIVE DIAGNOSIS:  Same/ total laparoscopic hysterectomy, bilateral salpingo-oophorectomy      DIAGNOSIS:  A. UTERUS; HYSTERECTOMY:  - ENDOMETRIOID CARCINOMA WITH SQUAMOUS DIFFERENTIATION, GRADE 1,  INVADING LESS THAN ONE HALF OF MYOMETRIUM.  - CERVIX NOT INVOLVED.  - INTRAMURAL LEIOMYOMA.   RIGHT AND LEFT FALLOPIAN TUBES AND OVARIES; SALPINGO-OOPHORECTOMY:  - NOT INVOLVED.  - BENIGN SIMPLE CYST OF RIGHT OVARY, 3.2 CM.   Comment:  The cervix was received in two detached pieces. The corpus was received  intact.   ENDOMETRIUM: Hysterectomy, With or Without Other Organs or Tissues  Endometrium, Hysterectomy, With or Without Other Organs or Tissues  Cancer Case Summary  Specimen: Uterine corpus  SPECIMEN  Procedure  Simple hysterectomy  Additional Procedures:  Bilateral salpingo-oophorectomy  Lymph Node Sampling:   Not performed  Specimen  Integrity: Intact hysterectomy specimen  TUMOR  Histologic Type:  Endometrioid adenocarcinoma, variant (specify)  with squamous differentiation  Histologic Grade:  FIGO grade 1  EXTENT  Tumor Size:  Greatest dimension (cm)  3.5cm  Myometrial Invasion:   Present  Depth of Myometrial Invasion: Specify depth of invasion (mm)  67mm  Myometrial Thickness (mm):  60mm  Involvement of Cervix:  Not involved  Other Organs Submitted: Right ovary  Not involved  Left ovary  Not involved  Right fallopian tube  Not involved  Left fallopian tube  Not involved  ACCESSORY FINDINGS  Lymph-Vascular Invasion: Present  STAGE (pTNM [FIGO])  Primary Tumor (pT):  pT1a [IA]: Tumor limited to endometrium or invades less than one-half  of the myometrium  Regional Lymph Nodes (pN)  pNX: Cannot be assessed  Pelvic Lymph Nodes: No pelvic nodes submitted or found  Para-aortic Lymph Nodes: No para-aortic nodes submitted or found  Distant Metastasis (pM): Not applicable            Cytology: DIAGNOSIS:  A. PELVIC WASHINGS:  - NEGATIVE FOR MALIGNANCY.    Problem List: Patient Active Problem List   Diagnosis Date Noted  . Conversion disorder 05/26/2015  . Paresthesias 05/25/2015  . Cervicalgia 05/11/2015  . Endometrial cancer 05/04/2015  . Other and unspecified hyperlipidemia 05/21/2014  . Elevated blood sugar 05/21/2014  . Medicare annual wellness visit, subsequent 05/19/2013  . Screening for breast cancer 04/13/2013  . Special screening for malignant neoplasms, colon 04/13/2013    Past Medical History: Past Medical History  Diagnosis Date  . Heart murmur   . Hyperlipidemia   . Squamous cell carcinoma     face  . Cataract   . Endometrial  cancer     Grade 1  . History of colonoscopy 2014    within normal limits  . History of mammogram 2015  . History of CVA (cerebrovascular accident)   . Rectal prolapse   . Anxiety   . History of ovarian cyst 01/2015    Past Surgical  History: Past Surgical History  Procedure Laterality Date  . Appendectomy    . Tonsilectomy, adenoidectomy, bilateral myringotomy and tubes    . Combined hysteroscopy diagnostic / d&c  2016  . Laparoscopic hysterectomy  03/30/15  . Bilateral salpingoophorectomy  03/30/15  . Dnc    . Dilation and curettage of uterus    . Abdominal hysterectomy    . Tonsillectomy       OB History:  OB History  Gravida Para Term Preterm AB SAB TAB Ectopic Multiple Living  0 0 0 0 0 0 0 0 0 0       Obstetric Comments  Age at first menarche-age [age]    Family History: Family History  Problem Relation Age of Onset  . Colon cancer Father     older age onset  . COPD Father   . COPD Mother     Social History: Social History   Social History  . Marital Status: Single    Spouse Name: N/A  . Number of Children: 0  . Years of Education: PhD   Occupational History  . Retired    Social History Main Topics  . Smoking status: Former Smoker    Quit date: 04/13/1981  . Smokeless tobacco: Never Used  . Alcohol Use: 1.2 oz/week    2 Glasses of wine per week     Comment: Occasional wine.  . Drug Use: No  . Sexual Activity: Not on file   Other Topics Concern  . Not on file   Social History Narrative   Lives in [place]. No children.      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      1-2 cups caffeine daily.    Allergies: No Known Allergies  Current Medications: Current Outpatient Prescriptions  Medication Sig Dispense Refill  . aspirin 81 MG chewable tablet Chew 81 mg by mouth.    . DULoxetine (CYMBALTA) 30 MG capsule Take 60 mg by mouth daily.    . meloxicam (MOBIC) 15 MG tablet Take 1 tablet (15 mg total) by mouth daily. 30 tablet 0  . Risperidone 0.25 MG TBDP Take 1.25 mg by mouth.    . Vitamin D, Ergocalciferol, (DRISDOL) 50000 UNITS CAPS capsule Take 50,000 Units by mouth.     . diazepam (VALIUM) 5 MG tablet Take 1 tablet (5 mg total) by mouth daily as needed. (Patient not taking: Reported on 08/31/2015) 30 tablet 0  . gabapentin (NEURONTIN) 100 MG capsule Take 100 mg by mouth 3 (three) times daily.     No current facility-administered medications for this visit.    Review of Systems General: negative for, fevers, chills, fatigue Skin: Negative HEENT: negative for, pain, sore throat, neck masses Pulmonary: negative for, dyspnea, productive cough Cardiac: negative for, pain, discomfort, pressure Gastrointestinal: negative for, nausea, vomiting, constipation, diarrhea Genitourinary/Sexual: negative for, dysuria Ob/Gyn: negative for, irregular bleeding, pain Musculoskeletal: positive for feeling her spine is twisted.  Hematology: negative for, easy bruising, bleeding Neurologic/Psych: negative for, weakness  Objective:   Filed Vitals:  08/31/15 1022  BP: 160/90  Pulse: 79  Temp: 97.5 F (36.4 C)  TempSrc: Tympanic  Resp: 18  Height: 5\' 6"  (1.676 m)  Weight: 182 lb 12.2 oz (82.9 kg)      ECOG Performance Status: 1 - Symptomatic but completely ambulatory  General appearance: alert, cooperative and appears stated age HEENT: ATNC Neck: normal Lungs: clear to A and P Heart: Normal rate and rhythm Abdomen: no palpable masses, no hernias, well healed incisions, soft, nontender, nondistended. Umbilicus normal.  Extremities: no lower extremity edema Neurological exam reveals alert, oriented, normal speech, no focal findings or movement disorder noted  Pelvic: EGBUS: normal, Vagina: well healed, Bimanual: no masses or nodularity     Assessment:  Pamela Norman is a 74 y.o. female diagnosed with stage IA, grade 1, Endometrioid adenocarcinoma of the endometrium with positive LVSI.   Plan:   Problem List Items Addressed This Visit      Genitourinary   Endometrial cancer - Primary      She will see Dr. Baruch Gouty today to start vaginal  brachytherapy treatments.   We also discussed surveillance and I like to see her again in 6 months, and we can start alternating with Dr. Enzo Bi every 6 months for 5 years.  Mellody Drown, MD    CC:  Referring Provider: Dr. Hassell Done Defrancesco  Jackolyn Confer, MD [address] [place], [place] [address] [phone]

## 2015-09-01 DIAGNOSIS — Z510 Encounter for antineoplastic radiation therapy: Secondary | ICD-10-CM | POA: Diagnosis not present

## 2015-09-01 DIAGNOSIS — R011 Cardiac murmur, unspecified: Secondary | ICD-10-CM | POA: Diagnosis not present

## 2015-09-01 DIAGNOSIS — Z8673 Personal history of transient ischemic attack (TIA), and cerebral infarction without residual deficits: Secondary | ICD-10-CM | POA: Diagnosis not present

## 2015-09-01 DIAGNOSIS — Z90722 Acquired absence of ovaries, bilateral: Secondary | ICD-10-CM | POA: Diagnosis not present

## 2015-09-01 DIAGNOSIS — Z90710 Acquired absence of both cervix and uterus: Secondary | ICD-10-CM | POA: Diagnosis not present

## 2015-09-01 DIAGNOSIS — C541 Malignant neoplasm of endometrium: Secondary | ICD-10-CM | POA: Diagnosis not present

## 2015-09-05 DIAGNOSIS — R011 Cardiac murmur, unspecified: Secondary | ICD-10-CM | POA: Diagnosis not present

## 2015-09-05 DIAGNOSIS — C541 Malignant neoplasm of endometrium: Secondary | ICD-10-CM | POA: Diagnosis not present

## 2015-09-05 DIAGNOSIS — Z8673 Personal history of transient ischemic attack (TIA), and cerebral infarction without residual deficits: Secondary | ICD-10-CM | POA: Diagnosis not present

## 2015-09-05 DIAGNOSIS — Z90710 Acquired absence of both cervix and uterus: Secondary | ICD-10-CM | POA: Diagnosis not present

## 2015-09-05 DIAGNOSIS — Z90722 Acquired absence of ovaries, bilateral: Secondary | ICD-10-CM | POA: Diagnosis not present

## 2015-09-05 DIAGNOSIS — Z510 Encounter for antineoplastic radiation therapy: Secondary | ICD-10-CM | POA: Diagnosis not present

## 2015-09-06 ENCOUNTER — Ambulatory Visit
Admission: RE | Admit: 2015-09-06 | Discharge: 2015-09-06 | Disposition: A | Discharge status: Home / Self Care | Payer: Medicare Other | Source: Ambulatory Visit | Attending: Radiation Oncology | Admitting: Radiation Oncology

## 2015-09-06 DIAGNOSIS — C541 Malignant neoplasm of endometrium: Secondary | ICD-10-CM | POA: Diagnosis not present

## 2015-09-06 DIAGNOSIS — Z8673 Personal history of transient ischemic attack (TIA), and cerebral infarction without residual deficits: Secondary | ICD-10-CM | POA: Diagnosis not present

## 2015-09-06 DIAGNOSIS — Z510 Encounter for antineoplastic radiation therapy: Secondary | ICD-10-CM | POA: Diagnosis not present

## 2015-09-06 DIAGNOSIS — R011 Cardiac murmur, unspecified: Secondary | ICD-10-CM | POA: Diagnosis not present

## 2015-09-06 DIAGNOSIS — Z90710 Acquired absence of both cervix and uterus: Secondary | ICD-10-CM | POA: Diagnosis not present

## 2015-09-06 DIAGNOSIS — Z90722 Acquired absence of ovaries, bilateral: Secondary | ICD-10-CM | POA: Diagnosis not present

## 2015-09-07 ENCOUNTER — Ambulatory Visit: Payer: Medicare Other

## 2015-09-08 ENCOUNTER — Ambulatory Visit: Payer: Medicare Other

## 2015-09-09 ENCOUNTER — Ambulatory Visit: Payer: Medicare Other

## 2015-09-12 ENCOUNTER — Ambulatory Visit
Admission: RE | Admit: 2015-09-12 | Discharge: 2015-09-12 | Disposition: A | Discharge status: Home / Self Care | Payer: Medicare Other | Source: Ambulatory Visit | Attending: Radiation Oncology | Admitting: Radiation Oncology

## 2015-09-12 ENCOUNTER — Inpatient Hospital Stay
Admission: RE | Admit: 2015-09-12 | Discharge: 2015-09-12 | Disposition: A | Discharge status: Home / Self Care | Payer: Self-pay | Source: Ambulatory Visit | Attending: Radiation Oncology | Admitting: Radiation Oncology

## 2015-09-12 DIAGNOSIS — R011 Cardiac murmur, unspecified: Secondary | ICD-10-CM | POA: Diagnosis not present

## 2015-09-12 DIAGNOSIS — E785 Hyperlipidemia, unspecified: Secondary | ICD-10-CM | POA: Diagnosis not present

## 2015-09-12 DIAGNOSIS — F419 Anxiety disorder, unspecified: Secondary | ICD-10-CM | POA: Diagnosis not present

## 2015-09-12 DIAGNOSIS — Z8673 Personal history of transient ischemic attack (TIA), and cerebral infarction without residual deficits: Secondary | ICD-10-CM | POA: Diagnosis not present

## 2015-09-12 DIAGNOSIS — Z7982 Long term (current) use of aspirin: Secondary | ICD-10-CM | POA: Diagnosis not present

## 2015-09-12 DIAGNOSIS — Z79899 Other long term (current) drug therapy: Secondary | ICD-10-CM | POA: Diagnosis not present

## 2015-09-12 DIAGNOSIS — Z859 Personal history of malignant neoplasm, unspecified: Secondary | ICD-10-CM | POA: Diagnosis not present

## 2015-09-12 DIAGNOSIS — Z510 Encounter for antineoplastic radiation therapy: Secondary | ICD-10-CM | POA: Diagnosis not present

## 2015-09-12 DIAGNOSIS — Z90722 Acquired absence of ovaries, bilateral: Secondary | ICD-10-CM | POA: Diagnosis not present

## 2015-09-12 DIAGNOSIS — C541 Malignant neoplasm of endometrium: Secondary | ICD-10-CM | POA: Diagnosis not present

## 2015-09-12 DIAGNOSIS — Z90710 Acquired absence of both cervix and uterus: Secondary | ICD-10-CM | POA: Diagnosis not present

## 2015-09-12 DIAGNOSIS — Z87891 Personal history of nicotine dependence: Secondary | ICD-10-CM | POA: Diagnosis not present

## 2015-09-12 DIAGNOSIS — Z791 Long term (current) use of non-steroidal anti-inflammatories (NSAID): Secondary | ICD-10-CM | POA: Diagnosis not present

## 2015-09-14 ENCOUNTER — Ambulatory Visit
Admission: RE | Admit: 2015-09-14 | Discharge: 2015-09-14 | Disposition: A | Discharge status: Home / Self Care | Payer: Medicare Other | Source: Ambulatory Visit | Attending: Radiation Oncology | Admitting: Radiation Oncology

## 2015-09-14 ENCOUNTER — Inpatient Hospital Stay
Admission: RE | Admit: 2015-09-14 | Discharge: 2015-09-14 | Disposition: A | Discharge status: Home / Self Care | Payer: Self-pay | Source: Ambulatory Visit | Attending: Radiation Oncology | Admitting: Radiation Oncology

## 2015-09-14 DIAGNOSIS — Z90722 Acquired absence of ovaries, bilateral: Secondary | ICD-10-CM | POA: Diagnosis not present

## 2015-09-14 DIAGNOSIS — Z510 Encounter for antineoplastic radiation therapy: Secondary | ICD-10-CM | POA: Diagnosis not present

## 2015-09-14 DIAGNOSIS — Z8673 Personal history of transient ischemic attack (TIA), and cerebral infarction without residual deficits: Secondary | ICD-10-CM | POA: Diagnosis not present

## 2015-09-14 DIAGNOSIS — Z90710 Acquired absence of both cervix and uterus: Secondary | ICD-10-CM | POA: Diagnosis not present

## 2015-09-14 DIAGNOSIS — C541 Malignant neoplasm of endometrium: Secondary | ICD-10-CM | POA: Diagnosis not present

## 2015-09-14 DIAGNOSIS — R011 Cardiac murmur, unspecified: Secondary | ICD-10-CM | POA: Diagnosis not present

## 2015-09-16 ENCOUNTER — Inpatient Hospital Stay
Admission: RE | Admit: 2015-09-16 | Discharge: 2015-09-16 | Disposition: A | Discharge status: Home / Self Care | Payer: Self-pay | Source: Ambulatory Visit | Attending: Radiation Oncology | Admitting: Radiation Oncology

## 2015-09-16 ENCOUNTER — Ambulatory Visit
Admission: RE | Admit: 2015-09-16 | Discharge: 2015-09-16 | Disposition: A | Discharge status: Home / Self Care | Payer: Medicare Other | Source: Ambulatory Visit | Attending: Radiation Oncology | Admitting: Radiation Oncology

## 2015-09-16 DIAGNOSIS — Z90710 Acquired absence of both cervix and uterus: Secondary | ICD-10-CM | POA: Diagnosis not present

## 2015-09-16 DIAGNOSIS — R011 Cardiac murmur, unspecified: Secondary | ICD-10-CM | POA: Diagnosis not present

## 2015-09-16 DIAGNOSIS — Z8673 Personal history of transient ischemic attack (TIA), and cerebral infarction without residual deficits: Secondary | ICD-10-CM | POA: Diagnosis not present

## 2015-09-16 DIAGNOSIS — Z510 Encounter for antineoplastic radiation therapy: Secondary | ICD-10-CM | POA: Diagnosis not present

## 2015-09-16 DIAGNOSIS — Z90722 Acquired absence of ovaries, bilateral: Secondary | ICD-10-CM | POA: Diagnosis not present

## 2015-09-16 DIAGNOSIS — C541 Malignant neoplasm of endometrium: Secondary | ICD-10-CM | POA: Diagnosis not present

## 2015-09-20 ENCOUNTER — Ambulatory Visit
Admission: RE | Admit: 2015-09-20 | Discharge: 2015-09-20 | Disposition: A | Discharge status: Home / Self Care | Payer: Medicare Other | Source: Ambulatory Visit | Attending: Radiation Oncology | Admitting: Radiation Oncology

## 2015-09-20 DIAGNOSIS — C541 Malignant neoplasm of endometrium: Secondary | ICD-10-CM | POA: Diagnosis not present

## 2015-09-20 DIAGNOSIS — Z8673 Personal history of transient ischemic attack (TIA), and cerebral infarction without residual deficits: Secondary | ICD-10-CM | POA: Diagnosis not present

## 2015-09-20 DIAGNOSIS — R011 Cardiac murmur, unspecified: Secondary | ICD-10-CM | POA: Diagnosis not present

## 2015-09-20 DIAGNOSIS — Z510 Encounter for antineoplastic radiation therapy: Secondary | ICD-10-CM | POA: Diagnosis not present

## 2015-09-20 DIAGNOSIS — Z90710 Acquired absence of both cervix and uterus: Secondary | ICD-10-CM | POA: Diagnosis not present

## 2015-09-20 DIAGNOSIS — Z90722 Acquired absence of ovaries, bilateral: Secondary | ICD-10-CM | POA: Diagnosis not present

## 2015-10-12 DIAGNOSIS — F331 Major depressive disorder, recurrent, moderate: Secondary | ICD-10-CM | POA: Diagnosis not present

## 2015-10-31 ENCOUNTER — Ambulatory Visit
Admission: RE | Admit: 2015-10-31 | Discharge: 2015-10-31 | Disposition: A | Discharge status: Home / Self Care | Payer: Medicare Other | Source: Ambulatory Visit | Attending: Radiation Oncology | Admitting: Radiation Oncology

## 2015-10-31 ENCOUNTER — Encounter: Payer: Self-pay | Admitting: Radiation Oncology

## 2015-10-31 VITALS — BP 147/93 | HR 70 | Temp 98.6°F | Resp 18 | Wt 187.8 lb

## 2015-10-31 DIAGNOSIS — C541 Malignant neoplasm of endometrium: Secondary | ICD-10-CM

## 2015-10-31 NOTE — Progress Notes (Signed)
Radiation Oncology Follow up Note  Name: Pamela Norman   Date:   10/31/2015 MRN:  [id number] DOB: 01-Apr-1941    This 74 y.o. female presents to the clinic today for follow-up for high intermediate risk individual carcinoma stage Ia status post high-dose rate remote afterloading to vaginal apex.  REFERRING PROVIDER: Jackolyn Confer, MD  HPI: Patient is a 74 year old female now 1 month out having received high-dose rate remote afterloading to the vaginal apex status post TAH/BSO and pelvic lymph node sampling for endometrioid carcinoma with squamous differentiation with positive lymphovascular invasion overall tumor size of 3.5. She is seen today in routine follow-up and is doing well. She specifically denies diarrhea dysuria or any other GI/GU complaints..  COMPLICATIONS OF TREATMENT: none  FOLLOW UP COMPLIANCE: keeps appointments   PHYSICAL EXAM:  BP 147/93 mmHg  Pulse 70  Temp(Src) 98.6 F (37 C)  Resp 18  Wt 187 lb 13.3 oz (85.2 kg) On speculum examination vaginal vault is clear without evidence of mass or nodularity. Bimanual examination shows no evidence of parametrial mass or nodularity. Rectal exam is unremarkable. Well-developed well-nourished patient in NAD. HEENT reveals PERLA, EOMI, discs not visualized.  Oral cavity is clear. No oral mucosal lesions are identified. Neck is clear without evidence of cervical or supraclavicular adenopathy. Lungs are clear to A&P. Cardiac examination is essentially unremarkable with regular rate and rhythm without murmur rub or thrill. Abdomen is benign with no organomegaly or masses noted. Motor sensory and DTR levels are equal and symmetric in the upper and lower extremities. Cranial nerves II through XII are grossly intact. Proprioception is intact. No peripheral adenopathy or edema is identified. No motor or sensory levels are noted. Crude visual fields are within normal range.  RADIOLOGY RESULTS: No current films for review  PLAN: At the  present time she is doing well with no evidence of disease 1 month out. I'm please were overall progress. I've asked to see her back in 4-5 months for follow-up. She continues close follow-up care with GYN oncology. Patient knows to call with any concerns.  I would like to take this opportunity for allowing me to participate in the care of your patient.Armstead Peaks., MD

## 2015-11-23 DIAGNOSIS — F23 Brief psychotic disorder: Secondary | ICD-10-CM | POA: Diagnosis not present

## 2016-01-06 DIAGNOSIS — H1859 Other hereditary corneal dystrophies: Secondary | ICD-10-CM | POA: Diagnosis not present

## 2016-01-18 DIAGNOSIS — F411 Generalized anxiety disorder: Secondary | ICD-10-CM | POA: Diagnosis not present

## 2016-01-19 DIAGNOSIS — H2513 Age-related nuclear cataract, bilateral: Secondary | ICD-10-CM | POA: Diagnosis not present

## 2016-01-27 DIAGNOSIS — H2513 Age-related nuclear cataract, bilateral: Secondary | ICD-10-CM | POA: Diagnosis not present

## 2016-01-31 ENCOUNTER — Encounter: Payer: Self-pay | Admitting: *Deleted

## 2016-02-05 NOTE — H&P (Signed)
See scanned note.

## 2016-02-06 ENCOUNTER — Ambulatory Visit: Payer: Medicare Other | Admitting: Anesthesiology

## 2016-02-06 ENCOUNTER — Encounter: Payer: Self-pay | Admitting: *Deleted

## 2016-02-06 ENCOUNTER — Ambulatory Visit
Admission: RE | Admit: 2016-02-06 | Discharge: 2016-02-06 | Disposition: A | Discharge status: Home / Self Care | Payer: Medicare Other | Source: Ambulatory Visit | Attending: Ophthalmology | Admitting: Ophthalmology

## 2016-02-06 ENCOUNTER — Encounter
Admission: RE | Disposition: A | Discharge status: Home / Self Care | Payer: Self-pay | Source: Ambulatory Visit | Attending: Ophthalmology

## 2016-02-06 DIAGNOSIS — R011 Cardiac murmur, unspecified: Secondary | ICD-10-CM | POA: Insufficient documentation

## 2016-02-06 DIAGNOSIS — Z8542 Personal history of malignant neoplasm of other parts of uterus: Secondary | ICD-10-CM | POA: Diagnosis not present

## 2016-02-06 DIAGNOSIS — Z90710 Acquired absence of both cervix and uterus: Secondary | ICD-10-CM | POA: Insufficient documentation

## 2016-02-06 DIAGNOSIS — Z85828 Personal history of other malignant neoplasm of skin: Secondary | ICD-10-CM | POA: Diagnosis not present

## 2016-02-06 DIAGNOSIS — H2513 Age-related nuclear cataract, bilateral: Secondary | ICD-10-CM | POA: Diagnosis not present

## 2016-02-06 DIAGNOSIS — Z87891 Personal history of nicotine dependence: Secondary | ICD-10-CM | POA: Insufficient documentation

## 2016-02-06 DIAGNOSIS — F329 Major depressive disorder, single episode, unspecified: Secondary | ICD-10-CM | POA: Diagnosis not present

## 2016-02-06 DIAGNOSIS — H2511 Age-related nuclear cataract, right eye: Secondary | ICD-10-CM | POA: Insufficient documentation

## 2016-02-06 DIAGNOSIS — E785 Hyperlipidemia, unspecified: Secondary | ICD-10-CM | POA: Diagnosis not present

## 2016-02-06 HISTORY — PX: CATARACT EXTRACTION W/PHACO: SHX586

## 2016-02-06 SURGERY — PHACOEMULSIFICATION, CATARACT, WITH IOL INSERTION
Anesthesia: Monitor Anesthesia Care | Site: Eye | Laterality: Right | Wound class: Clean

## 2016-02-06 MED ORDER — CARBACHOL 0.01 % IO SOLN
INTRAOCULAR | Status: DC | PRN
  Administered 2016-02-06: .5 mL via INTRAOCULAR

## 2016-02-06 MED ORDER — NA CHONDROIT SULF-NA HYALURON 40-17 MG/ML IO SOLN
INTRAOCULAR | Status: DC | PRN
  Administered 2016-02-06: 1 mL via INTRAOCULAR

## 2016-02-06 MED ORDER — ALFENTANIL 500 MCG/ML IJ INJ
INJECTION | INTRAMUSCULAR | Status: DC | PRN
  Administered 2016-02-06: 500 ug via INTRAVENOUS

## 2016-02-06 MED ORDER — MOXIFLOXACIN HCL 0.5 % OP SOLN
OPHTHALMIC | Status: DC | PRN
  Administered 2016-02-06: 1 [drp] via OPHTHALMIC

## 2016-02-06 MED ORDER — LIDOCAINE HCL (PF) 4 % IJ SOLN
INTRAMUSCULAR | Status: DC | PRN
  Administered 2016-02-06: 4 mL via OPHTHALMIC

## 2016-02-06 MED ORDER — LIDOCAINE HCL (PF) 4 % IJ SOLN
INTRAOCULAR | Status: DC | PRN
  Administered 2016-02-06: .5 mL via OPHTHALMIC

## 2016-02-06 MED ORDER — SODIUM CHLORIDE 0.9 % IV SOLN
INTRAVENOUS | Status: DC
  Administered 2016-02-06: 50 mL/h via INTRAVENOUS

## 2016-02-06 MED ORDER — EPINEPHRINE HCL 1 MG/ML IJ SOLN
INTRAMUSCULAR | Status: AC
  Filled 2016-02-06: qty 2

## 2016-02-06 MED ORDER — TETRACAINE HCL 0.5 % OP SOLN
OPHTHALMIC | Status: AC
  Filled 2016-02-06: qty 2

## 2016-02-06 MED ORDER — TETRACAINE HCL 0.5 % OP SOLN
OPHTHALMIC | Status: DC | PRN
  Administered 2016-02-06: 1 [drp] via OPHTHALMIC

## 2016-02-06 MED ORDER — PHENYLEPHRINE HCL 10 % OP SOLN
1.0000 [drp] | OPHTHALMIC | Status: AC | PRN
  Administered 2016-02-06 (×4): 1 [drp] via OPHTHALMIC

## 2016-02-06 MED ORDER — BUPIVACAINE HCL (PF) 0.75 % IJ SOLN
INTRAMUSCULAR | Status: AC
  Filled 2016-02-06: qty 10

## 2016-02-06 MED ORDER — EPINEPHRINE HCL 1 MG/ML IJ SOLN
INTRAOCULAR | Status: DC | PRN
  Administered 2016-02-06: 1 mL via OPHTHALMIC

## 2016-02-06 MED ORDER — DEXMEDETOMIDINE HCL 200 MCG/2ML IV SOLN
INTRAVENOUS | Status: DC | PRN
  Administered 2016-02-06: 6 ug via INTRAVENOUS

## 2016-02-06 MED ORDER — PHENYLEPHRINE HCL 10 % OP SOLN
OPHTHALMIC | Status: AC
  Administered 2016-02-06: 1 [drp] via OPHTHALMIC
  Filled 2016-02-06: qty 5

## 2016-02-06 MED ORDER — CEFUROXIME OPHTHALMIC INJECTION 1 MG/0.1 ML
INJECTION | OPHTHALMIC | Status: DC | PRN
  Administered 2016-02-06: .1 mL via INTRACAMERAL

## 2016-02-06 MED ORDER — LIDOCAINE HCL (PF) 4 % IJ SOLN
INTRAMUSCULAR | Status: AC
  Filled 2016-02-06: qty 5

## 2016-02-06 MED ORDER — CYCLOPENTOLATE HCL 2 % OP SOLN
1.0000 [drp] | OPHTHALMIC | Status: AC | PRN
  Administered 2016-02-06 (×4): 1 [drp] via OPHTHALMIC

## 2016-02-06 MED ORDER — CYCLOPENTOLATE HCL 2 % OP SOLN
OPHTHALMIC | Status: AC
  Administered 2016-02-06: 1 [drp] via OPHTHALMIC
  Filled 2016-02-06: qty 2

## 2016-02-06 MED ORDER — MIDAZOLAM HCL 2 MG/2ML IJ SOLN
INTRAMUSCULAR | Status: DC | PRN
  Administered 2016-02-06: 1 mg via INTRAVENOUS

## 2016-02-06 MED ORDER — MOXIFLOXACIN HCL 0.5 % OP SOLN
OPHTHALMIC | Status: AC
  Administered 2016-02-06: 1 [drp] via OPHTHALMIC
  Filled 2016-02-06: qty 3

## 2016-02-06 MED ORDER — CEFUROXIME OPHTHALMIC INJECTION 1 MG/0.1 ML
INJECTION | OPHTHALMIC | Status: AC
  Filled 2016-02-06: qty 0.1

## 2016-02-06 MED ORDER — NA CHONDROIT SULF-NA HYALURON 40-17 MG/ML IO SOLN
INTRAOCULAR | Status: AC
  Filled 2016-02-06: qty 1

## 2016-02-06 MED ORDER — HYALURONIDASE HUMAN 150 UNIT/ML IJ SOLN
INTRAMUSCULAR | Status: AC
  Filled 2016-02-06: qty 1

## 2016-02-06 MED ORDER — MOXIFLOXACIN HCL 0.5 % OP SOLN
1.0000 [drp] | OPHTHALMIC | Status: AC | PRN
  Administered 2016-02-06 (×3): 1 [drp] via OPHTHALMIC

## 2016-02-06 MED ORDER — LABETALOL HCL 5 MG/ML IV SOLN
INTRAVENOUS | Status: DC | PRN
  Administered 2016-02-06 (×2): 5 mg via INTRAVENOUS

## 2016-02-06 SURGICAL SUPPLY — 30 items
CANNULA ANT/CHMB 27GA (MISCELLANEOUS) ×3 IMPLANT
CORD BIP STRL DISP 12FT (MISCELLANEOUS) ×3 IMPLANT
CUP MEDICINE 2OZ PLAST GRAD ST (MISCELLANEOUS) ×3 IMPLANT
DRAPE XRAY CASSETTE 23X24 (DRAPES) ×3 IMPLANT
ERASER HMR WETFIELD 18G (MISCELLANEOUS) ×3 IMPLANT
GLOVE BIO SURGEON STRL SZ8 (GLOVE) ×3 IMPLANT
GLOVE SURG LX 6.5 MICRO (GLOVE) ×2
GLOVE SURG LX 8.0 MICRO (GLOVE) ×2
GLOVE SURG LX STRL 6.5 MICRO (GLOVE) ×1 IMPLANT
GLOVE SURG LX STRL 8.0 MICRO (GLOVE) ×1 IMPLANT
GOWN STRL REUS W/ TWL LRG LVL3 (GOWN DISPOSABLE) ×1 IMPLANT
GOWN STRL REUS W/ TWL XL LVL3 (GOWN DISPOSABLE) ×1 IMPLANT
GOWN STRL REUS W/TWL LRG LVL3 (GOWN DISPOSABLE) ×2
GOWN STRL REUS W/TWL XL LVL3 (GOWN DISPOSABLE) ×2
KIT IRRIGAT 0.9 MICROSMOOTH (MISCELLANEOUS) ×3 IMPLANT
LENS IOL ACRYSOF IQ 21.0 (Intraocular Lens) ×3 IMPLANT
PACK CATARACT (MISCELLANEOUS) ×3 IMPLANT
PACK CATARACT DINGLEDEIN LX (MISCELLANEOUS) ×3 IMPLANT
PACK EYE AFTER SURG (MISCELLANEOUS) ×3 IMPLANT
SHLD EYE VISITEC  UNIV (MISCELLANEOUS) ×3 IMPLANT
SOL BSS BAG (MISCELLANEOUS) ×3
SOL PREP PVP 2OZ (MISCELLANEOUS) ×3
SOLUTION BSS BAG (MISCELLANEOUS) ×1 IMPLANT
SOLUTION PREP PVP 2OZ (MISCELLANEOUS) ×1 IMPLANT
SUT SILK 5-0 (SUTURE) ×3 IMPLANT
SYR 3ML LL SCALE MARK (SYRINGE) ×3 IMPLANT
SYR 5ML LL (SYRINGE) ×3 IMPLANT
SYR TB 1ML 27GX1/2 LL (SYRINGE) ×3 IMPLANT
WATER STERILE IRR 1000ML POUR (IV SOLUTION) ×3 IMPLANT
WIPE NON LINTING 3.25X3.25 (MISCELLANEOUS) ×3 IMPLANT

## 2016-02-06 NOTE — Anesthesia Preprocedure Evaluation (Signed)
Anesthesia Evaluation  Patient identified by MRN, date of birth, ID band Patient awake    Reviewed: Allergy & Precautions, H&P , NPO status , Patient's Chart, lab work & pertinent test results, reviewed documented beta blocker date and time   History of Anesthesia Complications Negative for: history of anesthetic complications  Airway Mallampati: III  TM Distance: >3 FB Neck ROM: full    Dental no notable dental hx. (+) Caps, Teeth Intact   Pulmonary neg pulmonary ROS, former smoker,    Pulmonary exam normal breath sounds clear to auscultation       Cardiovascular Exercise Tolerance: Good (-) hypertension(-) angina(-) CAD, (-) Past MI, (-) Cardiac Stents and (-) CABG Normal cardiovascular exam(-) dysrhythmias + Valvular Problems/Murmurs (since childhood)  Rhythm:regular Rate:Normal     Neuro/Psych negative neurological ROS  negative psych ROS   GI/Hepatic negative GI ROS, Neg liver ROS,   Endo/Other  negative endocrine ROS  Renal/GU negative Renal ROS  negative genitourinary   Musculoskeletal   Abdominal   Peds  Hematology negative hematology ROS (+)   Anesthesia Other Findings Past Medical History:   Heart murmur                                                 Hyperlipidemia                                               Cataract                                                     History of colonoscopy                          2014           Comment:within normal limits   History of mammogram                            2015         Rectal prolapse                                              Anxiety                                                      History of ovarian cyst                         01/2015       Depression  Squamous cell carcinoma ([place])                                  Comment:face   Endometrial cancer ([place])                        2016        Comment:Grade 1,total laparoscopic hysterectomy   Reproductive/Obstetrics negative OB ROS                             Anesthesia Physical Anesthesia Plan  ASA: II  Anesthesia Plan: MAC   Post-op Pain Management:    Induction:   Airway Management Planned:   Additional Equipment:   Intra-op Plan:   Post-operative Plan:   Informed Consent: I have reviewed the patients History and Physical, chart, labs and discussed the procedure including the risks, benefits and alternatives for the proposed anesthesia with the patient or authorized representative who has indicated his/her understanding and acceptance.   Dental Advisory Given  Plan Discussed with: Anesthesiologist, CRNA and Surgeon  Anesthesia Plan Comments:         Anesthesia Quick Evaluation

## 2016-02-06 NOTE — Discharge Instructions (Signed)
AMBULATORY SURGERY  DISCHARGE INSTRUCTIONS   1) The drugs that you were given will stay in your system until tomorrow so for the next 24 hours you should not:  A) Drive an automobile B) Make any legal decisions C) Drink any alcoholic beverage   2) You may resume regular meals tomorrow.  Today it is better to start with liquids and gradually work up to solid foods.  You may eat anything you prefer, but it is better to start with liquids, then soup and crackers, and gradually work up to solid foods.   3) Please notify your doctor immediately if you have any unusual bleeding, trouble breathing, redness and pain at the surgery site, drainage, fever, or pain not relieved by medication.    4) Additional Instructions:   Eye Surgery Discharge Instructions  Expect mild scratchy sensation or mild soreness. DO NOT RUB YOUR EYE!  The day of surgery:  Minimal physical activity, but bed rest is not required  No reading, computer work, or close hand work  No bending, lifting, or straining.  May watch TV  For 24 hours:  No driving, legal decisions, or alcoholic beverages  Safety precautions  Eat anything you prefer: It is better to start with liquids, then soup then solid foods.  _____ Eye patch should be worn until postoperative exam tomorrow.  ____ Solar shield eyeglasses should be worn for comfort in the sunlight/patch while sleeping  Resume all regular medications including aspirin or Coumadin if these were discontinued prior to surgery. You may shower, bathe, shave, or wash your hair. Tylenol may be taken for mild discomfort.  Call your doctor if you experience significant pain, nausea, or vomiting, fever > 101 or other signs of infection. [phone] or [phone] Specific instructions:  Follow-up Information    Follow up with Leldon Steege, MD In 1 day.   Specialty:  Ophthalmology   Why:  february 28 at 10:45am   Contact information:   [address]   [place] [address] [phone]          Please contact your physician with any problems or Same Day Surgery at [phone], Monday through Friday 6 am to 4 pm, or [place] at [facility] number at [phone].

## 2016-02-06 NOTE — Transfer of Care (Signed)
Immediate Anesthesia Transfer of Care Note  Patient: Pamela Norman  Procedure(s) Performed: Procedure(s) with comments: CATARACT EXTRACTION PHACO AND INTRAOCULAR LENS PLACEMENT ([facility]) (Right) - [place] 01:39 AP% 24.9 CDE 47.32 fluid pack lot # [id number]  Patient Location: PACU  Anesthesia Type:MAC  Level of Consciousness: awake, alert , oriented and patient cooperative  Airway & Oxygen Therapy: Patient Spontanous Breathing  Post-op Assessment: Report given to RN and Post -op Vital signs reviewed and stable  Post vital signs: Reviewed and stable  Last Vitals:  Filed Vitals:   02/06/16 0820  BP: 147/50  Pulse: 98  Temp: 36.6 C  Resp: 16    Complications: No apparent anesthesia complications

## 2016-02-06 NOTE — Interval H&P Note (Signed)
History and Physical Interval Note:  02/06/2016 9:48 AM  Pamela Norman  has presented today for surgery, with the diagnosis of cataract  The various methods of treatment have been discussed with the patient and family. After consideration of risks, benefits and other options for treatment, the patient has consented to  Procedure(s): CATARACT EXTRACTION PHACO AND INTRAOCULAR LENS PLACEMENT ([place]) (Right) as a surgical intervention .  The patient's history has been reviewed, patient examined, no change in status, stable for surgery.  I have reviewed the patient's chart and labs.  Questions were answered to the patient's satisfaction.     Joycie Aerts

## 2016-02-06 NOTE — Op Note (Signed)
Date of Surgery: 02/06/2016 Date of Dictation: 02/06/2016 10:34 AM Pre-operative Diagnosis:  Nuclear Sclerotic Cataract right Eye Post-operative Diagnosis: same Procedure performed: Extra-capsular Cataract Extraction (ECCE) with placement of a posterior chamber intraocular lens (IOL) right Eye IOL: * No implants in log * Anesthesia: 2% Lidocaine and 4% Marcaine in a 50/50 mixture with 10 unites/ml of Hylenex given as a peribulbar Anesthesiologist: Anesthesiologist: Martha Clan, MD CRNA: Rolla Plate, CRNA; Bernardo Heater, CRNA Complications: none Estimated Blood Loss: less than 1 ml  Description of procedure:  The patient was given anesthesia and sedation via intravenous access. The patient was then prepped and draped in the usual fashion. A 25-gauge needle was bent for initiating the capsulorhexis. A 5-0 silk suture was placed through the conjunctiva superior and inferiorly to serve as bridle sutures. Hemostasis was obtained at the superior limbus using an eraser cautery. A partial thickness groove was made at the anterior surgical limbus with a 64 Beaver blade and this was dissected anteriorly with an Avaya. The anterior chamber was entered at 10 o'clock with a 1.0 mm paracentesis knife and through the lamellar dissection with a 2.6 mm Alcon keratome. Epi-Shugarcaine 0.5 CC [9 cc BSS Plus (Alcon), 3 cc 4% preservative-free lidocaine (Hospira) and 4 cc 1:1000 preservative-free, bisulfite-free epinephrine] was injected into the anterior chamber via the paracentesis tract. Epi-Shugarcaine 0.5 CC [9 cc BSS Plus (Alcon), 3 cc 4% preservative-free lidocaine (Hospira) and 4 cc 1:1000 preservative-free, bisulfite-free epinephrine] was injected into the anterior chamber via the paracentesis tract. DiscoVisc was injected to replace the aqueous and a continuous tear curvilinear capsulorhexis was performed using a bent 25-gauge needle.  Balance salt on a syringe was used to perform  hydro-dissection and phacoemulsification was carried out using a divide and conquer technique. Procedure(s) with comments: CATARACT EXTRACTION PHACO AND INTRAOCULAR LENS PLACEMENT ([facility]) (Right) - [place] 01:39 AP% 24.9 CDE 47.32 fluid pack lot # [id number]. Irrigation/aspiration was used to remove the residual cortex and the capsular bag was inflated with DiscoVisc. The intraocular lens was inserted into the capsular bag using a pre-loaded UltraSert Delivery System. Irrigation/aspiration was used to remove the residual DiscoVisc. The wound was inflated with balanced salt and checked for leaks. None were found. Miostat was injected via the paracentesis track and 0.1 ml of cefuroxime containing 1 mg of drug  was injected via the paracentesis track. The wound was checked for leaks again and none were found.   The bridal sutures were removed and two drops of Vigamox were placed on the eye. An eye shield was placed to protect the eye and the patient was discharged to the recovery area in good condition.   Syndey Jaskolski MD

## 2016-02-07 NOTE — Anesthesia Postprocedure Evaluation (Signed)
Anesthesia Post Note  Patient: LATRICE BEVENS  Procedure(s) Performed: Procedure(s) (LRB): CATARACT EXTRACTION PHACO AND INTRAOCULAR LENS PLACEMENT ([facility]) (Right)  Patient location during evaluation: PACU Anesthesia Type: General Level of consciousness: awake and alert Pain management: pain level controlled Vital Signs Assessment: post-procedure vital signs reviewed and stable Respiratory status: spontaneous breathing, nonlabored ventilation, respiratory function stable and patient connected to nasal cannula oxygen Cardiovascular status: blood pressure returned to baseline and stable Postop Assessment: no signs of nausea or vomiting Anesthetic complications: no    Last Vitals:  Filed Vitals:   02/06/16 1040 02/06/16 1042  BP: 161/90 163/86  Pulse: 76 74  Temp: 36.6 C   Resp: 16 16    Last Pain:  Filed Vitals:   02/07/16 0809  PainSc: 0-No pain                 Martha Clan

## 2016-02-13 ENCOUNTER — Encounter: Payer: Self-pay | Admitting: Ophthalmology

## 2016-02-24 DIAGNOSIS — F411 Generalized anxiety disorder: Secondary | ICD-10-CM | POA: Diagnosis not present

## 2016-02-29 ENCOUNTER — Inpatient Hospital Stay: Payer: Medicare Other

## 2016-03-02 ENCOUNTER — Encounter: Payer: Self-pay | Admitting: *Deleted

## 2016-03-02 DIAGNOSIS — H2512 Age-related nuclear cataract, left eye: Secondary | ICD-10-CM | POA: Diagnosis not present

## 2016-03-04 NOTE — H&P (Signed)
See scanned note.

## 2016-03-05 ENCOUNTER — Ambulatory Visit: Payer: Medicare Other | Admitting: Anesthesiology

## 2016-03-05 ENCOUNTER — Ambulatory Visit
Admission: RE | Admit: 2016-03-05 | Discharge: 2016-03-05 | Disposition: A | Discharge status: Home / Self Care | Payer: Medicare Other | Source: Ambulatory Visit | Attending: Ophthalmology | Admitting: Ophthalmology

## 2016-03-05 ENCOUNTER — Encounter
Admission: RE | Disposition: A | Discharge status: Home / Self Care | Payer: Self-pay | Source: Ambulatory Visit | Attending: Ophthalmology

## 2016-03-05 ENCOUNTER — Encounter: Payer: Self-pay | Admitting: *Deleted

## 2016-03-05 DIAGNOSIS — Z9841 Cataract extraction status, right eye: Secondary | ICD-10-CM | POA: Diagnosis not present

## 2016-03-05 DIAGNOSIS — Z85828 Personal history of other malignant neoplasm of skin: Secondary | ICD-10-CM | POA: Insufficient documentation

## 2016-03-05 DIAGNOSIS — H2512 Age-related nuclear cataract, left eye: Secondary | ICD-10-CM | POA: Diagnosis not present

## 2016-03-05 DIAGNOSIS — F418 Other specified anxiety disorders: Secondary | ICD-10-CM | POA: Diagnosis not present

## 2016-03-05 DIAGNOSIS — Z87891 Personal history of nicotine dependence: Secondary | ICD-10-CM | POA: Insufficient documentation

## 2016-03-05 DIAGNOSIS — F329 Major depressive disorder, single episode, unspecified: Secondary | ICD-10-CM | POA: Diagnosis not present

## 2016-03-05 DIAGNOSIS — Z90710 Acquired absence of both cervix and uterus: Secondary | ICD-10-CM | POA: Diagnosis not present

## 2016-03-05 DIAGNOSIS — E785 Hyperlipidemia, unspecified: Secondary | ICD-10-CM | POA: Diagnosis not present

## 2016-03-05 DIAGNOSIS — Z8542 Personal history of malignant neoplasm of other parts of uterus: Secondary | ICD-10-CM | POA: Diagnosis not present

## 2016-03-05 HISTORY — PX: CATARACT EXTRACTION W/PHACO: SHX586

## 2016-03-05 SURGERY — PHACOEMULSIFICATION, CATARACT, WITH IOL INSERTION
Anesthesia: Monitor Anesthesia Care | Site: Eye | Laterality: Left | Wound class: Clean

## 2016-03-05 MED ORDER — CYCLOPENTOLATE HCL 2 % OP SOLN
1.0000 [drp] | OPHTHALMIC | Status: AC
  Administered 2016-03-05 (×4): 1 [drp] via OPHTHALMIC

## 2016-03-05 MED ORDER — NA CHONDROIT SULF-NA HYALURON 40-17 MG/ML IO SOLN
INTRAOCULAR | Status: DC | PRN
  Administered 2016-03-05: 1 mL via INTRAOCULAR

## 2016-03-05 MED ORDER — EPINEPHRINE HCL 1 MG/ML IJ SOLN
INTRAOCULAR | Status: DC | PRN
  Administered 2016-03-05: 1 mL via OPHTHALMIC

## 2016-03-05 MED ORDER — MIDAZOLAM HCL 5 MG/5ML IJ SOLN
INTRAMUSCULAR | Status: DC | PRN
  Administered 2016-03-05: 2 mg via INTRAVENOUS

## 2016-03-05 MED ORDER — LIDOCAINE HCL (PF) 4 % IJ SOLN
INTRAMUSCULAR | Status: AC
  Filled 2016-03-05: qty 5

## 2016-03-05 MED ORDER — TETRACAINE HCL 0.5 % OP SOLN
OPHTHALMIC | Status: DC | PRN
  Administered 2016-03-05: 1 [drp] via OPHTHALMIC

## 2016-03-05 MED ORDER — NA CHONDROIT SULF-NA HYALURON 40-17 MG/ML IO SOLN
INTRAOCULAR | Status: AC
  Filled 2016-03-05: qty 1

## 2016-03-05 MED ORDER — MOXIFLOXACIN HCL 0.5 % OP SOLN
1.0000 [drp] | OPHTHALMIC | Status: AC
  Administered 2016-03-05 (×3): 1 [drp] via OPHTHALMIC

## 2016-03-05 MED ORDER — TETRACAINE HCL 0.5 % OP SOLN
OPHTHALMIC | Status: AC
  Filled 2016-03-05: qty 2

## 2016-03-05 MED ORDER — CYCLOPENTOLATE HCL 2 % OP SOLN
OPHTHALMIC | Status: AC
  Filled 2016-03-05: qty 2

## 2016-03-05 MED ORDER — PROPARACAINE HCL 0.5 % OP SOLN
OPHTHALMIC | Status: DC | PRN
  Administered 2016-03-05: 1 [drp] via OPHTHALMIC

## 2016-03-05 MED ORDER — CEFUROXIME OPHTHALMIC INJECTION 1 MG/0.1 ML
INJECTION | OPHTHALMIC | Status: AC
  Filled 2016-03-05: qty 0.1

## 2016-03-05 MED ORDER — LIDOCAINE HCL 2 % IJ SOLN
0.1000 mL | Freq: Once | INTRAMUSCULAR | Status: DC
  Filled 2016-03-05: qty 10

## 2016-03-05 MED ORDER — POVIDONE-IODINE 5 % OP SOLN
OPHTHALMIC | Status: DC | PRN
  Administered 2016-03-05: 1 via OPHTHALMIC

## 2016-03-05 MED ORDER — CEFUROXIME OPHTHALMIC INJECTION 1 MG/0.1 ML
INJECTION | OPHTHALMIC | Status: DC | PRN
  Administered 2016-03-05: .1 mL via INTRACAMERAL

## 2016-03-05 MED ORDER — EPINEPHRINE HCL 1 MG/ML IJ SOLN
INTRAMUSCULAR | Status: AC
Start: 2016-03-05 — End: 2016-03-05
  Filled 2016-03-05: qty 2

## 2016-03-05 MED ORDER — POVIDONE-IODINE 5 % OP SOLN
OPHTHALMIC | Status: AC
  Filled 2016-03-05: qty 30

## 2016-03-05 MED ORDER — BUPIVACAINE HCL (PF) 0.75 % IJ SOLN
INTRAMUSCULAR | Status: AC
  Filled 2016-03-05: qty 10

## 2016-03-05 MED ORDER — ALFENTANIL 500 MCG/ML IJ INJ
INJECTION | INTRAMUSCULAR | Status: DC | PRN
  Administered 2016-03-05: 250 ug via INTRAVENOUS

## 2016-03-05 MED ORDER — BUPIVACAINE HCL (PF) 0.75 % IJ SOLN
INTRAMUSCULAR | Status: DC | PRN
  Administered 2016-03-05: 4 mL via OPHTHALMIC

## 2016-03-05 MED ORDER — SODIUM CHLORIDE 0.9 % IV SOLN
INTRAVENOUS | Status: DC
  Administered 2016-03-05: 10:00:00 via INTRAVENOUS

## 2016-03-05 MED ORDER — PHENYLEPHRINE HCL 10 % OP SOLN
OPHTHALMIC | Status: AC
  Filled 2016-03-05: qty 5

## 2016-03-05 MED ORDER — PHENYLEPHRINE HCL 10 % OP SOLN
1.0000 [drp] | OPHTHALMIC | Status: AC
  Administered 2016-03-05 (×4): 1 [drp] via OPHTHALMIC

## 2016-03-05 MED ORDER — MOXIFLOXACIN HCL 0.5 % OP SOLN
OPHTHALMIC | Status: AC
  Filled 2016-03-05: qty 3

## 2016-03-05 MED ORDER — LIDOCAINE HCL (PF) 4 % IJ SOLN
INTRAOCULAR | Status: DC | PRN
  Administered 2016-03-05: .5 mL via OPHTHALMIC

## 2016-03-05 MED ORDER — CARBACHOL 0.01 % IO SOLN
INTRAOCULAR | Status: DC | PRN
  Administered 2016-03-05: .5 mL via INTRAOCULAR

## 2016-03-05 MED ORDER — PROPARACAINE HCL 0.5 % OP SOLN
OPHTHALMIC | Status: AC
  Filled 2016-03-05: qty 15

## 2016-03-05 MED ORDER — HYALURONIDASE HUMAN 150 UNIT/ML IJ SOLN
INTRAMUSCULAR | Status: AC
  Filled 2016-03-05: qty 1

## 2016-03-05 MED ORDER — MOXIFLOXACIN HCL 0.5 % OP SOLN
OPHTHALMIC | Status: DC | PRN
  Administered 2016-03-05: 1 [drp] via OPHTHALMIC

## 2016-03-05 SURGICAL SUPPLY — 31 items
CANNULA ANT/CHMB 27GA (MISCELLANEOUS) ×3 IMPLANT
CORD BIP STRL DISP 12FT (MISCELLANEOUS) ×3 IMPLANT
CUP MEDICINE 2OZ PLAST GRAD ST (MISCELLANEOUS) ×3 IMPLANT
DRAPE XRAY CASSETTE 23X24 (DRAPES) ×3 IMPLANT
ERASER HMR WETFIELD 18G (MISCELLANEOUS) ×3 IMPLANT
GLOVE BIO SURGEON STRL SZ8 (GLOVE) ×3 IMPLANT
GLOVE SURG LX 6.5 MICRO (GLOVE) ×2
GLOVE SURG LX 8.0 MICRO (GLOVE) ×2
GLOVE SURG LX STRL 6.5 MICRO (GLOVE) ×1 IMPLANT
GLOVE SURG LX STRL 8.0 MICRO (GLOVE) ×1 IMPLANT
GOWN STRL REUS W/ TWL LRG LVL3 (GOWN DISPOSABLE) ×1 IMPLANT
GOWN STRL REUS W/ TWL XL LVL3 (GOWN DISPOSABLE) ×1 IMPLANT
GOWN STRL REUS W/TWL LRG LVL3 (GOWN DISPOSABLE) ×2
GOWN STRL REUS W/TWL XL LVL3 (GOWN DISPOSABLE) ×2
LENS IOL ACRSF IQ TRC 4 20.5 (Intraocular Lens) ×1 IMPLANT
LENS IOL ACRYSOF IQ TORIC 20.5 (Intraocular Lens) ×2 IMPLANT
LENS IOL IQ TORIC 4 20.5 (Intraocular Lens) ×1 IMPLANT
PACK CATARACT (MISCELLANEOUS) ×3 IMPLANT
PACK CATARACT DINGLEDEIN LX (MISCELLANEOUS) ×3 IMPLANT
PACK EYE AFTER SURG (MISCELLANEOUS) ×3 IMPLANT
SHLD EYE VISITEC  UNIV (MISCELLANEOUS) ×3 IMPLANT
SOL BSS BAG (MISCELLANEOUS) ×3
SOL PREP PVP 2OZ (MISCELLANEOUS) ×3
SOLUTION BSS BAG (MISCELLANEOUS) ×1 IMPLANT
SOLUTION PREP PVP 2OZ (MISCELLANEOUS) ×1 IMPLANT
SUT SILK 5-0 (SUTURE) ×3 IMPLANT
SYR 3ML LL SCALE MARK (SYRINGE) ×3 IMPLANT
SYR 5ML LL (SYRINGE) ×3 IMPLANT
SYR TB 1ML 27GX1/2 LL (SYRINGE) ×3 IMPLANT
WATER STERILE IRR 1000ML POUR (IV SOLUTION) ×3 IMPLANT
WIPE NON LINTING 3.25X3.25 (MISCELLANEOUS) ×3 IMPLANT

## 2016-03-05 NOTE — Progress Notes (Signed)
IV right hand - p ox right mid finger To OR with shoe covers/ecg leads in place vigamox and bi flow oxi tubing sent to OR with patient

## 2016-03-05 NOTE — Interval H&P Note (Signed)
History and Physical Interval Note:  03/05/2016 11:12 AM  Pamela Norman  has presented today for surgery, with the diagnosis of CATARACT  The various methods of treatment have been discussed with the patient and family. After consideration of risks, benefits and other options for treatment, the patient has consented to  Procedure(s): CATARACT EXTRACTION PHACO AND INTRAOCULAR LENS PLACEMENT ([place]) (Left) as a surgical intervention .  The patient's history has been reviewed, patient examined, no change in status, stable for surgery.  I have reviewed the patient's chart and labs.  Questions were answered to the patient's satisfaction.     Maryrose Colvin

## 2016-03-05 NOTE — Op Note (Signed)
Date of Surgery: 03/05/2016 Date of Dictation: 03/05/2016 12:02 PM Pre-operative Diagnosis:Nuclear Sclerotic Cataract left Eye Post-operative Diagnosis: same Procedure performed: Extra-capsular Cataract Extraction (ECCE) with placement of a posterior chamber intraocular lens (IOL) left Eye IOL:  Implant Name Type Inv. Item Serial No. Manufacturer Lot No. LRB No. Used  acrysof toric lens     [id number] ALCON   Left 1   Anesthesia: 2% Lidocaine and 4% Marcaine in a 50/50 mixture with 10 unites/ml of Hylenex given as a peribulbar Anesthesiologist: Anesthesiologist: Alvin Critchley, MD CRNA: Silvana Newness, CRNA Complications: none Estimated Blood Loss: less than 1 ml  Description of procedure:  The patient sat upright and the eyes were anesthetized with topical proparacaine. With the patient fixing at a distant target the 3 o'clock and 9 o'clock positions at the limbus were marked with an sterile indelible surgical marking pen.  The patient was given anesthesia and sedation via intravenous access. The patient was then prepped and draped in the usual fashion. A 25-gauge needle was bent to use for starting the capsulorhexis. A 5-0 silk suture was placed through the conjunctiva superior and inferiorly to serve as bridle sutures.   The [place] system was engaged and registration was performed. The positions of the incisions and the steep axis of the astigmatism were identified by the [place] system and marked with an indelible pen. The Verion heads up display was turned off.  Hemostasis was obtained at the the position of the main incision using an eraser cautery. A partial thickness groove was made at the at that location with a 64 Beaver blade and this was dissected anteriorly with an Avaya. The anterior chamber was entered at 10 o'clock with a 1.0 mm paracentesis knife and through the lamellar dissection with a 2.6 mm Alcon keratome. Epi-Shugarcaine 0.5 CC [9 cc BSS Plus (Alcon), 3 cc 4%  preservative-free lidocaine (Hospira) and 4 cc 1:1000 preservative-free, bisulfite-free epinephrine] was injected into the anterior chamber via the paracentesis tract. DiscoVisc was injected to replace the aqueous and a continuous tear curvilinear capsulorhexis was performed using a bent 25-gauge needle.  Balance salt on a syringe was used to perform hydro-dissection and phacoemulsification was carried out using a divide and conquer technique. Procedure(s) with comments: CATARACT EXTRACTION PHACO AND INTRAOCULAR LENS PLACEMENT ([facility]) (Left) - [place] 01:02 AP% 23.2 CDE 26.73 fluid pack lot # [id number]. Irrigation/aspiration was used to remove the residual cortex and the capsular bag was inflated with DiscoVisc. The intraocular lens was inserted into the capsular bag using a Monarch Delivery System cartridge. The Verion heads up display was turned on and the lens was rotated so that the marks on the base of the haptics were aligned with the steep axis of astigmatism as identified by the [place] unit.   Irrigation/aspiration was used to remove the residual DiscoVisc. The I/A hand piece was pressed down on top of the lens to prevent rotation. The wound was inflated with balanced salt and checked for leaks. None were found. The position of the Toric lens was reconfirmed using the [place] unit. Miostat was injected via the paracentesis track and 0.1 ml of cefuroxime containing 1 mg of drug was injected via the paracentesis track. The wound was checked for leaks again and none were found.   The bridal sutures were removed and two drops of Vigamox were placed on the eye. An eye shield was placed to protect the eye and the patient was discharged to the recovery area in good condition.   Pamela Doughtie  MD @T @

## 2016-03-05 NOTE — Anesthesia Postprocedure Evaluation (Signed)
Anesthesia Post Note  Patient: Pamela Norman  Procedure(s) Performed: Procedure(s) (LRB): CATARACT EXTRACTION PHACO AND INTRAOCULAR LENS PLACEMENT (I[facility]) (Left)  Patient location during evaluation: PACU Anesthesia Type: MAC Level of consciousness: awake and alert and oriented Pain management: pain level controlled Vital Signs Assessment: post-procedure vital signs reviewed and stable Respiratory status: spontaneous breathing and respiratory function stable Cardiovascular status: stable Postop Assessment: no headache    Last Vitals:  Filed Vitals:   03/05/16 0943 03/05/16 1206  BP: 147/90 157/85  Pulse: 92 66  Temp: 36.2 C 36.4 C  Resp: 16 16    Last Pain: There were no vitals filed for this visit.               Silvana Newness A

## 2016-03-05 NOTE — Discharge Instructions (Addendum)
See handout. Eye Surgery Discharge Instructions  Expect mild scratchy sensation or mild soreness. DO NOT RUB YOUR EYE!  The day of surgery:  Minimal physical activity, but bed rest is not required  No reading, computer work, or close hand work  No bending, lifting, or straining.  May watch TV  For 24 hours:  No driving, legal decisions, or alcoholic beverages  Safety precautions  Eat anything you prefer: It is better to start with liquids, then soup then solid foods.  _____ Eye patch should be worn until postoperative exam tomorrow.  ____ Solar shield eyeglasses should be worn for comfort in the sunlight/patch while sleeping  Resume all regular medications including aspirin or Coumadin if these were discontinued prior to surgery. You may shower, bathe, shave, or wash your hair. Tylenol may be taken for mild discomfort.  Call your doctor if you experience significant pain, nausea, or vomiting, fever > 101 or other signs of infection. [phone] or [phone] Specific instructions:  Follow-up Information    Follow up with Estill Cotta, MD.   Specialty:  Ophthalmology   Why:  03-06-16 at 11:00   Contact information:   [address]   [place] [address] [phone]

## 2016-03-05 NOTE — Transfer of Care (Signed)
Immediate Anesthesia Transfer of Care Note  Patient: Pamela Norman  Procedure(s) Performed: Procedure(s) with comments: CATARACT EXTRACTION PHACO AND INTRAOCULAR LENS PLACEMENT ([facility]) (Left) - [place] 01:02 AP% 23.2 CDE 26.73 fluid pack lot # [id number]  Patient Location: PACU  Anesthesia Type:MAC  Level of Consciousness: awake, alert , oriented and patient cooperative  Airway & Oxygen Therapy: Patient Spontanous Breathing  Post-op Assessment: Report given to RN, Post -op Vital signs reviewed and stable and Patient moving all extremities X 4  Post vital signs: Reviewed and stable  Last Vitals:  Filed Vitals:   03/05/16 0943  BP: 147/90  Pulse: 92  Temp: 36.2 C  Resp: 16    Complications: No apparent anesthesia complications

## 2016-03-05 NOTE — Anesthesia Preprocedure Evaluation (Signed)
Anesthesia Evaluation  Patient identified by MRN, date of birth, ID band Patient awake    Reviewed: Allergy & Precautions, H&P , NPO status , Patient's Chart, lab work & pertinent test results, reviewed documented beta blocker date and time   History of Anesthesia Complications Negative for: history of anesthetic complications  Airway Mallampati: III  TM Distance: >3 FB Neck ROM: full    Dental no notable dental hx. (+) Caps, Teeth Intact   Pulmonary neg pulmonary ROS, former smoker,    Pulmonary exam normal breath sounds clear to auscultation       Cardiovascular Exercise Tolerance: Good (-) hypertension(-) angina(-) CAD, (-) Past MI, (-) Cardiac Stents and (-) CABG negative cardio ROS Normal cardiovascular exam(-) dysrhythmias + Valvular Problems/Murmurs  Rhythm:regular Rate:Normal  RBBB   Neuro/Psych Anxiety Depression negative neurological ROS  negative psych ROS   GI/Hepatic negative GI ROS, Neg liver ROS, Rectal prolapse   Endo/Other  negative endocrine ROS  Renal/GU negative Renal ROS  Female GU complaint Endometrial CA negative genitourinary   Musculoskeletal negative musculoskeletal ROS (+)   Abdominal   Peds negative pediatric ROS (+)  Hematology negative hematology ROS (+)   Anesthesia Other Findings Ovarian cyst  Reproductive/Obstetrics negative OB ROS                             Anesthesia Physical  Anesthesia Plan  ASA: III  Anesthesia Plan: General   Post-op Pain Management:    Induction: Intravenous  Airway Management Planned: Nasal Cannula  Additional Equipment:   Intra-op Plan:   Post-operative Plan:   Informed Consent: I have reviewed the patients History and Physical, chart, labs and discussed the procedure including the risks, benefits and alternatives for the proposed anesthesia with the patient or authorized representative who has indicated his/her  understanding and acceptance.   Dental advisory given  Plan Discussed with: CRNA  Anesthesia Plan Comments:        Anesthesia Quick Evaluation

## 2016-03-07 ENCOUNTER — Encounter: Payer: Self-pay | Admitting: Ophthalmology

## 2016-03-07 ENCOUNTER — Inpatient Hospital Stay: Payer: Medicare Other | Attending: Obstetrics and Gynecology | Admitting: Obstetrics and Gynecology

## 2016-03-07 VITALS — BP 155/95 | HR 95 | Temp 98.6°F | Resp 18 | Wt 189.6 lb

## 2016-03-07 DIAGNOSIS — Z79899 Other long term (current) drug therapy: Secondary | ICD-10-CM | POA: Insufficient documentation

## 2016-03-07 DIAGNOSIS — M542 Cervicalgia: Secondary | ICD-10-CM | POA: Diagnosis not present

## 2016-03-07 DIAGNOSIS — K623 Rectal prolapse: Secondary | ICD-10-CM | POA: Diagnosis not present

## 2016-03-07 DIAGNOSIS — F449 Dissociative and conversion disorder, unspecified: Secondary | ICD-10-CM | POA: Insufficient documentation

## 2016-03-07 DIAGNOSIS — Z923 Personal history of irradiation: Secondary | ICD-10-CM

## 2016-03-07 DIAGNOSIS — C541 Malignant neoplasm of endometrium: Secondary | ICD-10-CM

## 2016-03-07 DIAGNOSIS — Z90722 Acquired absence of ovaries, bilateral: Secondary | ICD-10-CM | POA: Diagnosis not present

## 2016-03-07 DIAGNOSIS — F419 Anxiety disorder, unspecified: Secondary | ICD-10-CM | POA: Diagnosis not present

## 2016-03-07 DIAGNOSIS — Z8542 Personal history of malignant neoplasm of other parts of uterus: Secondary | ICD-10-CM | POA: Insufficient documentation

## 2016-03-07 DIAGNOSIS — Z87891 Personal history of nicotine dependence: Secondary | ICD-10-CM | POA: Insufficient documentation

## 2016-03-07 DIAGNOSIS — F17211 Nicotine dependence, cigarettes, in remission: Secondary | ICD-10-CM

## 2016-03-07 DIAGNOSIS — R739 Hyperglycemia, unspecified: Secondary | ICD-10-CM | POA: Insufficient documentation

## 2016-03-07 DIAGNOSIS — Z90710 Acquired absence of both cervix and uterus: Secondary | ICD-10-CM | POA: Insufficient documentation

## 2016-03-07 DIAGNOSIS — E785 Hyperlipidemia, unspecified: Secondary | ICD-10-CM | POA: Diagnosis not present

## 2016-03-07 DIAGNOSIS — Z7982 Long term (current) use of aspirin: Secondary | ICD-10-CM | POA: Insufficient documentation

## 2016-03-07 DIAGNOSIS — F329 Major depressive disorder, single episode, unspecified: Secondary | ICD-10-CM | POA: Diagnosis not present

## 2016-03-07 NOTE — Progress Notes (Signed)
  Oncology Nurse Navigator Documentation  Navigator Location: [facility] (03/07/16 1500) Navigator Encounter Type: [facility] Follow-up (03/07/16 1500)                                          Time Spent with Patient: 30 (03/07/16 1500)   Chaperoned with pelvic exam

## 2016-03-07 NOTE — Progress Notes (Signed)
Gynecologic Oncology Interval Note  Referring Provider: Dr. Hassell Done Defrancesco  Chief Concern: Endometrial cancer surveillance  Subjective:  Pamela Norman is a 75 y.o. female with stage IA grade 1 endometrial cancer.    No complaints today. Returns for routine follow up.  HPI Please refer to prior notes for complete details. On 03/30/15 she underwent total laparoscopic hysterectomy, bilateral salpingo-oophorectomy, pelvic sentinel lymph node mapping, and repair of vaginal lacerations caused by removal of the uterus transvaginally.  Her final pathology as noted below.  Sentinel nodes were not identified and node dissection not done since frozen showed grade 1 tumor invading less than 50%.  Since final path showed LVSI she was referred to Dr. Baruch Gouty for vaginal brachytherapy. She tolerated 6 HDR treatments well.      Surgical Procedure  CASE: [id number]  PATIENT: Pamela Norman  Surgical Pathology Report      SPECIMEN SUBMITTED:  A. Uterus, cervix, and bilateral tubes and ovaries   CLINICAL HISTORY:  None provided   PRE-OPERATIVE DIAGNOSIS:  grade 1 endometrioid endometrial cancer   POST-OPERATIVE DIAGNOSIS:  Same/ total laparoscopic hysterectomy, bilateral salpingo-oophorectomy      DIAGNOSIS:  A. UTERUS; HYSTERECTOMY:  - ENDOMETRIOID CARCINOMA WITH SQUAMOUS DIFFERENTIATION, GRADE 1,  INVADING LESS THAN ONE HALF OF MYOMETRIUM.  - CERVIX NOT INVOLVED.  - INTRAMURAL LEIOMYOMA.   RIGHT AND LEFT FALLOPIAN TUBES AND OVARIES; SALPINGO-OOPHORECTOMY:  - NOT INVOLVED.  - BENIGN SIMPLE CYST OF RIGHT OVARY, 3.2 CM.   Comment:  The cervix was received in two detached pieces. The corpus was received  intact.   ENDOMETRIUM: Hysterectomy, With or Without Other Organs or Tissues  Endometrium, Hysterectomy, With or Without Other Organs or Tissues  Cancer Case Summary  Specimen: Uterine corpus  SPECIMEN  Procedure  Simple hysterectomy  Additional Procedures:   Bilateral salpingo-oophorectomy  Lymph Node Sampling:   Not performed  Specimen Integrity: Intact hysterectomy specimen  TUMOR  Histologic Type:  Endometrioid adenocarcinoma, variant (specify)  with squamous differentiation  Histologic Grade:  FIGO grade 1  EXTENT  Tumor Size:  Greatest dimension (cm)  3.5cm  Myometrial Invasion:   Present  Depth of Myometrial Invasion: Specify depth of invasion (mm)  45mm  Myometrial Thickness (mm):  34mm  Involvement of Cervix:  Not involved  Other Organs Submitted: Right ovary  Not involved  Left ovary  Not involved  Right fallopian tube  Not involved  Left fallopian tube  Not involved  ACCESSORY FINDINGS  Lymph-Vascular Invasion: Present  STAGE (pTNM [FIGO])  Primary Tumor (pT):  pT1a [IA]: Tumor limited to endometrium or invades less than one-half  of the myometrium  Regional Lymph Nodes (pN)  pNX: Cannot be assessed  Pelvic Lymph Nodes: No pelvic nodes submitted or found  Para-aortic Lymph Nodes: No para-aortic nodes submitted or found  Distant Metastasis (pM): Not applicable            Cytology: DIAGNOSIS:  A. PELVIC WASHINGS:  - NEGATIVE FOR MALIGNANCY.    Problem List: Patient Active Problem List   Diagnosis Date Noted  . Conversion disorder 05/26/2015  . Paresthesias 05/25/2015  . Cervicalgia 05/11/2015  . Endometrial cancer ([place]) 05/04/2015  . Other and unspecified hyperlipidemia 05/21/2014  . Elevated blood sugar 05/21/2014  . Medicare annual wellness visit, subsequent 05/19/2013  . Screening for breast cancer 04/13/2013  . Special screening for malignant neoplasms, colon 04/13/2013    Past Medical History: Past Medical History  Diagnosis Date  . Heart murmur   . Hyperlipidemia   .  Cataract   . History of colonoscopy 2014    within normal limits  . History of mammogram 2015  . Rectal prolapse   . Anxiety   . History of ovarian cyst 01/2015  . Depression   . Squamous cell carcinoma ([place])      face  . Endometrial cancer ([place]) 2016    Grade 1,total laparoscopic hysterectomy    Past Surgical History: Past Surgical History  Procedure Laterality Date  . Appendectomy    . Tonsilectomy, adenoidectomy, bilateral myringotomy and tubes    . Combined hysteroscopy diagnostic / d&c  2016  . Laparoscopic hysterectomy  03/30/15  . Bilateral salpingoophorectomy  03/30/15  . Dnc    . Dilation and curettage of uterus    . Abdominal hysterectomy    . Tonsillectomy    . Nasal sinus surgery    . Skin cancer excision      UPPER LIP  . Cataract extraction w/phaco Right 02/06/2016    Procedure: CATARACT EXTRACTION PHACO AND INTRAOCULAR LENS PLACEMENT ([facility]);  Surgeon: Estill Cotta, MD;  Location: [facility] ORS;  Service: Ophthalmology;  Laterality: Right;  [place] 01:39 AP% 24.9 CDE 47.32 fluid pack lot # [id number]  . Cataract extraction w/phaco Left 03/05/2016    Procedure: CATARACT EXTRACTION PHACO AND INTRAOCULAR LENS PLACEMENT ([facility]);  Surgeon: Estill Cotta, MD;  Location: [facility] ORS;  Service: Ophthalmology;  Laterality: Left;  [place] 01:02 AP% 23.2 CDE 26.73 fluid pack lot # [id number]     OB History:  OB History  Gravida Para Term Preterm AB SAB TAB Ectopic Multiple Living  0 0 0 0 0 0 0 0 0 0       Obstetric Comments  Age at first menarche-age [age]    Family History: Family History  Problem Relation Age of Onset  . Colon cancer Father     older age onset  . COPD Father   . COPD Mother     Social History: Social History   Social History  . Marital Status: Single    Spouse Name: N/A  . Number of Children: 0  . Years of Education: PhD   Occupational History  . Retired    Social History Main Topics  . Smoking status: Former Smoker    Quit date: 04/13/1981  . Smokeless tobacco: Never Used  . Alcohol Use: 1.2 oz/week    2 Glasses of wine per week     Comment: Occasional wine.  . Drug Use: No  . Sexual Activity: Not on file   Other Topics Concern  . Not on file    Social History Narrative   Lives in [place]. No children.      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      1-2 cups caffeine daily.    Allergies: No Known Allergies  Current Medications: Current Outpatient Prescriptions  Medication Sig Dispense Refill  . aspirin 81 MG chewable tablet Chew 81 mg by mouth.    . diazepam (VALIUM) 5 MG tablet Take by mouth.    . DULoxetine (CYMBALTA) 30 MG capsule Take 60 mg by mouth daily.    Marland Kitchen FLUZONE HIGH-DOSE 0.5 ML SUSY ADM 0.5ML IM UTD  0  . risperiDONE (RISPERDAL) 1 MG tablet      No current facility-administered medications for this visit.    Review of Systems General: negative for, fevers, chills,  fatigue Skin: Negative HEENT: negative for, pain, sore throat, neck masses Pulmonary: negative for, dyspnea, productive cough Cardiac: negative for, pain, discomfort, pressure Gastrointestinal: negative for, nausea, vomiting, constipation, diarrhea Genitourinary/Sexual: negative for, dysuria Ob/Gyn: negative for, irregular bleeding, pain Musculoskeletal: positive for feeling her spine is twisted.  Hematology: negative for, easy bruising, bleeding Neurologic/Psych: negative for, weakness  Objective:   Filed Vitals:   03/07/16 1442  BP: 155/95  Pulse: 95  Temp: 98.6 F (37 C)  TempSrc: Tympanic  Resp: 18  Weight: 189 lb 9.5 oz (86 kg)      ECOG Performance Status: 1 - Symptomatic but completely ambulatory  General appearance: alert, cooperative and appears stated age HEENT: ATNC Neck: normal Lungs: clear to A and P Heart: Normal rate and rhythm Abdomen: no palpable masses, no hernias, well healed incisions, soft, nontender, nondistended. Umbilicus normal.  Extremities: no lower extremity edema Neurological exam reveals alert, oriented, normal speech, no focal findings or movement disorder noted  Pelvic: EGBUS: normal,  Vagina: well healed, no lesions, Bimanual: no masses or nodularity.  RV: confirms     Assessment:  Pamela Norman is a 75 y.o. female diagnosed with stage IA, grade 1, endometrioid adenocarcinoma of the endometrium S/P TLH/BSO in 4/16 and vaginal brachytherapy due to 4/9 mm invasion and LVSI. NED.   Plan:   Problem List Items Addressed This Visit      Genitourinary   Endometrial cancer ([place]) - Primary   Relevant Medications   diazepam (VALIUM) 5 MG tablet     We discussed surveillance and I will see her again in 6 months, and she will see Dr Baruch Gouty in 3 months. She will return sooner if there are any concerning symptoms.   Mellody Drown, MD   CC:  Referring Provider: Dr. Hassell Done Defrancesco  Jackolyn Confer, MD [id number] [place], [place] [address] [phone]

## 2016-04-23 ENCOUNTER — Ambulatory Visit: Payer: Medicare Other | Admitting: Radiation Oncology

## 2016-04-23 ENCOUNTER — Inpatient Hospital Stay: Admission: RE | Admit: 2016-04-23 | Payer: Medicare Other | Source: Ambulatory Visit | Admitting: Radiation Oncology

## 2016-04-27 DIAGNOSIS — F331 Major depressive disorder, recurrent, moderate: Secondary | ICD-10-CM | POA: Diagnosis not present

## 2016-05-21 ENCOUNTER — Encounter: Payer: Self-pay | Admitting: Radiation Oncology

## 2016-05-21 ENCOUNTER — Ambulatory Visit
Admission: RE | Admit: 2016-05-21 | Discharge: 2016-05-21 | Disposition: A | Discharge status: Home / Self Care | Payer: Medicare Other | Source: Ambulatory Visit | Attending: Radiation Oncology | Admitting: Radiation Oncology

## 2016-05-21 VITALS — BP 150/93 | HR 83 | Temp 98.3°F | Wt 193.1 lb

## 2016-05-21 DIAGNOSIS — Z08 Encounter for follow-up examination after completed treatment for malignant neoplasm: Secondary | ICD-10-CM | POA: Insufficient documentation

## 2016-05-21 DIAGNOSIS — Z8542 Personal history of malignant neoplasm of other parts of uterus: Secondary | ICD-10-CM | POA: Insufficient documentation

## 2016-05-21 DIAGNOSIS — C541 Malignant neoplasm of endometrium: Secondary | ICD-10-CM

## 2016-05-21 DIAGNOSIS — Z923 Personal history of irradiation: Secondary | ICD-10-CM | POA: Diagnosis not present

## 2016-05-21 NOTE — Progress Notes (Signed)
Radiation Oncology Follow up Note  Name: Pamela Norman   Date:   05/21/2016 MRN:  [id number] DOB: Apr 01, 1941    This 76 y.o. female presents to the clinic today for six-month follow-up for endometrial carcinoma status post high dose rate remote afterloading.  REFERRING PROVIDER: Jackolyn Confer, MD  HPI: Patient is a 75 year old female now out 6 months having completed high dose rate remote afterloading to the vaginal apex for high intermediate risk endometrial carcinoma stage Ia. Tumor was 3.5 cm with positive lymphovascular invasion. Seen today in routine follow-up she is doing well. She specifically denies diarrhea dysuria or any other GI/GU complaints. She had 4 of 9 mm myometrial invasion.. She recently had pelvic exam by GYN oncology showing no evidence of disease.  COMPLICATIONS OF TREATMENT: none  FOLLOW UP COMPLIANCE: keeps appointments   PHYSICAL EXAM:  BP 150/93 mmHg  Pulse 83  Temp(Src) 98.3 F (36.8 C)  Wt 193 lb 2 oz (87.6 kg) Well-developed well-nourished patient in NAD. HEENT reveals PERLA, EOMI, discs not visualized.  Oral cavity is clear. No oral mucosal lesions are identified. Neck is clear without evidence of cervical or supraclavicular adenopathy. Lungs are clear to A&P. Cardiac examination is essentially unremarkable with regular rate and rhythm without murmur rub or thrill. Abdomen is benign with no organomegaly or masses noted. Motor sensory and DTR levels are equal and symmetric in the upper and lower extremities. Cranial nerves II through XII are grossly intact. Proprioception is intact. No peripheral adenopathy or edema is identified. No motor or sensory levels are noted. Crude visual fields are within normal range.  RADIOLOGY RESULTS: No current films for review  PLAN: Present time she continues to do well with no evidence of disease. I will turn follow-up care over to GYN oncology since they will be performing her regular routine follow-up and pelvic exams.  I am please were overall progress. I will be happy to reevaluate the patient any time should further radiation therapy be indicated.  I would like to take this opportunity to thank you for allowing me to participate in the care of your patient.Armstead Peaks., MD

## 2016-06-13 ENCOUNTER — Telehealth: Payer: Self-pay | Admitting: *Deleted

## 2016-06-13 NOTE — Telephone Encounter (Signed)
Dr. Nicki Reaper, Please advise. thanks

## 2016-06-13 NOTE — Telephone Encounter (Signed)
Pt requested to have Dr.Scott has her PCP if possible  Pt contact [phone]

## 2016-06-13 NOTE — Telephone Encounter (Signed)
hold

## 2016-06-21 ENCOUNTER — Telehealth: Payer: Self-pay | Admitting: *Deleted

## 2016-06-21 NOTE — Telephone Encounter (Signed)
Patient needs an appt, thanks

## 2016-06-21 NOTE — Telephone Encounter (Signed)
scheduled

## 2016-06-21 NOTE — Telephone Encounter (Signed)
Needs to be seen

## 2016-06-21 NOTE — Telephone Encounter (Signed)
Patient has requested to have a referral to the orthopedic for her left leg, pt has has had pain for one week,no falls or injury. Patient stated that she rather see the orthopedic, verses coming into office to be seen. Pt contact [phone]

## 2016-06-21 NOTE — Telephone Encounter (Signed)
Please advise, thanks.

## 2016-06-22 ENCOUNTER — Ambulatory Visit (INDEPENDENT_AMBULATORY_CARE_PROVIDER_SITE_OTHER): Payer: Medicare Other

## 2016-06-22 ENCOUNTER — Encounter: Payer: Self-pay | Admitting: Family Medicine

## 2016-06-22 ENCOUNTER — Ambulatory Visit (INDEPENDENT_AMBULATORY_CARE_PROVIDER_SITE_OTHER): Payer: Medicare Other | Admitting: Family Medicine

## 2016-06-22 VITALS — BP 142/86 | HR 97 | Temp 98.1°F | Wt 190.1 lb

## 2016-06-22 DIAGNOSIS — M79605 Pain in left leg: Secondary | ICD-10-CM

## 2016-06-22 DIAGNOSIS — M5136 Other intervertebral disc degeneration, lumbar region: Secondary | ICD-10-CM | POA: Diagnosis not present

## 2016-06-22 MED ORDER — DICLOFENAC SODIUM 75 MG PO TBEC: 75.0000 mg | DELAYED_RELEASE_TABLET | Freq: Two times a day (BID) | ORAL | Status: DC

## 2016-06-22 NOTE — Patient Instructions (Signed)

## 2016-06-24 DIAGNOSIS — M79605 Pain in left leg: Secondary | ICD-10-CM | POA: Insufficient documentation

## 2016-06-24 NOTE — Assessment & Plan Note (Signed)
New problem. Suspect pain is coming from Lumbar spine. Obtaining xray and treating with Diclofenac.

## 2016-06-24 NOTE — Progress Notes (Signed)
Subjective:  Patient ID: Pamela Norman, female    DOB: 11/17/1941  Age: 75 y.o. MRN: [id number]  CC: Leg pain  HPI:  75 year old female presents with complaints of left leg pain.  Patient reports that she has had left leg pain past 1-1/2 weeks. Pain is located in the posterior thigh. She denies any associated back pain or hip pain. She does report that she's had some knee pain. She's taken Motrin with some improvement. No recent fall, trauma, injury. Patient states that it's impacting her gait. No known exacerbating factors. No reports of lower extremity numbness or tingling. No other complaints this time.  Social Hx   Social History   Social History  . Marital Status: Single    Spouse Name: N/A  . Number of Children: 0  . Years of Education: PhD   Occupational History  . Retired    Social History Main Topics  . Smoking status: Former Smoker    Quit date: 04/13/1981  . Smokeless tobacco: Never Used  . Alcohol Use: 1.2 oz/week    2 Glasses of wine per week     Comment: Occasional wine.  . Drug Use: No  . Sexual Activity: Not Asked   Other Topics Concern  . None   Social History Narrative   Lives in [place]. No children.      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      1-2 cups caffeine daily.   Review of Systems  Constitutional: Negative.   Musculoskeletal:       Leg pain.   Objective:  BP 142/86 mmHg  Pulse 97  Temp(Src) 98.1 F (36.7 C) (Oral)  Wt 190 lb 2 oz (86.24 kg)  SpO2 94%  BP/Weight 06/22/2016 05/21/2016 [id number]  Systolic BP [id number]  Diastolic BP 86 93 95  Wt. (Lbs) 190.13 193.12 189.6  BMI 30.23 30.71 30.15   Physical Exam  Constitutional: She appears well-developed. No distress.  Pulmonary/Chest: Effort normal.  Musculoskeletal:  Back - no areas of tenderness.  Hip (L) - mildly tender over the greater trochanter. Good range of  motion.  Knee (L): Normal to inspection with no erythema or effusion or obvious bony abnormalities.  Palpation normal with no warmth, joint line tenderness, patellar tenderness, or condyle tenderness. ROM full in flexion and extension and lower leg rotation.Ligaments with solid consistent endpoints including ACL, PCL, LCL, MCL. Negative Mcmurray's.   Neurological: She is alert.  Psychiatric:  Flat affect.   Vitals reviewed.  Lab Results  Component Value Date   WBC 7.7 07/29/2015   HGB 14.6 07/29/2015   HCT 44.4 07/29/2015   PLT 236 07/29/2015   GLUCOSE 142* 07/29/2015   CHOL 230* 05/21/2014   TRIG 201.0* 05/21/2014   HDL 35.50* 05/21/2014   LDLDIRECT 181.1 04/13/2013   LDLCALC 154* 05/21/2014   ALT 20 07/29/2015   AST 23 07/29/2015   NA 142 07/29/2015   K 3.8 07/29/2015   CL 105 07/29/2015   CREATININE 0.81 07/29/2015   BUN 19 07/29/2015   CO2 30 07/29/2015   TSH 3.26 05/25/2015   HGBA1C 6.1 05/26/2015   Assessment & Plan:   Problem List Items Addressed This Visit    Pain of left lower extremity - Primary    New problem. Suspect pain is coming from Lumbar spine. Obtaining xray and treating with  Diclofenac.      Relevant Orders   DG Lumbar Spine Complete (Completed)     Meds ordered this encounter  Medications  . diclofenac (VOLTAREN) 75 MG EC tablet    Sig: Take 1 tablet (75 mg total) by mouth 2 (two) times daily.    Dispense:  30 tablet    Refill:  0   Follow-up: Return if symptoms worsen or fail to improve.  [place]

## 2016-07-18 DIAGNOSIS — F411 Generalized anxiety disorder: Secondary | ICD-10-CM | POA: Diagnosis not present

## 2016-07-18 DIAGNOSIS — F23 Brief psychotic disorder: Secondary | ICD-10-CM | POA: Diagnosis not present

## 2016-07-31 IMAGING — CT CT HEAD W/O CM
1 series · 16 of 30 positions shown, 20 images · non-contrast
Comparison: 01/03/2015

CLINICAL DATA: Weakness, difficulty ambulating

EXAM:
CT HEAD WITHOUT CONTRAST
TECHNIQUE: Contiguous axial images were obtained from the base of the skull
through the vertex without intravenous contrast.

[Series 2: head wo · axial · 0.45mm/px · z∈[-189,-63]mm · 16 of 32 slices shown, 20 images]
[im 2/32  brain]
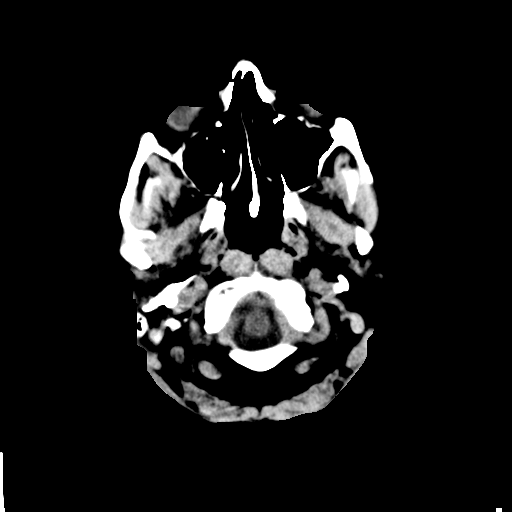
[im 2/32  bone]
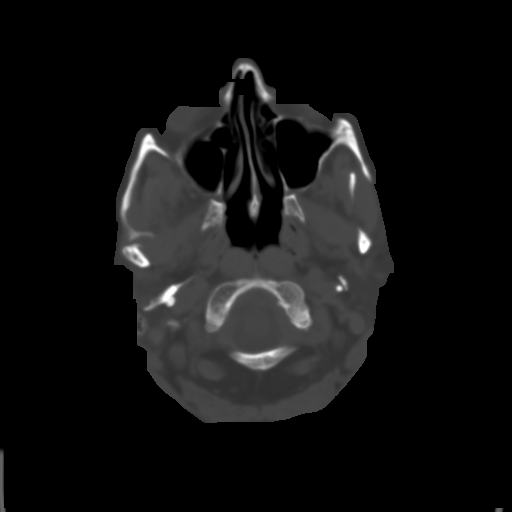
[im 4/32  brain]
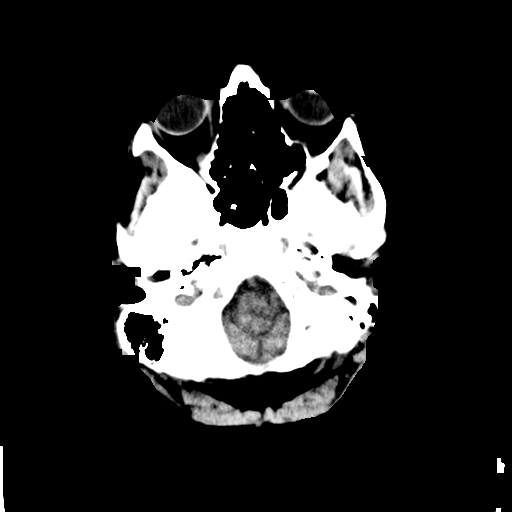
[im 6/32  brain]
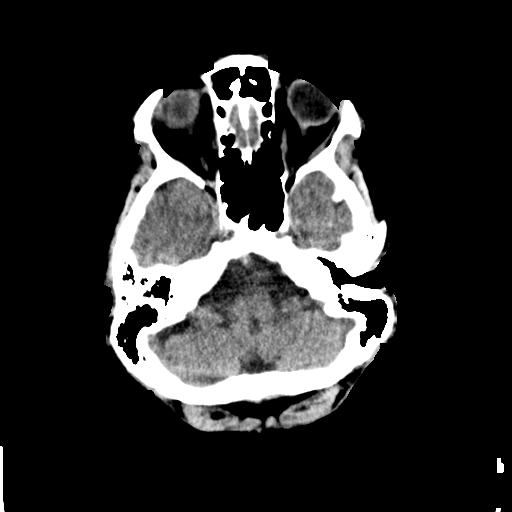
[im 8/32  brain]
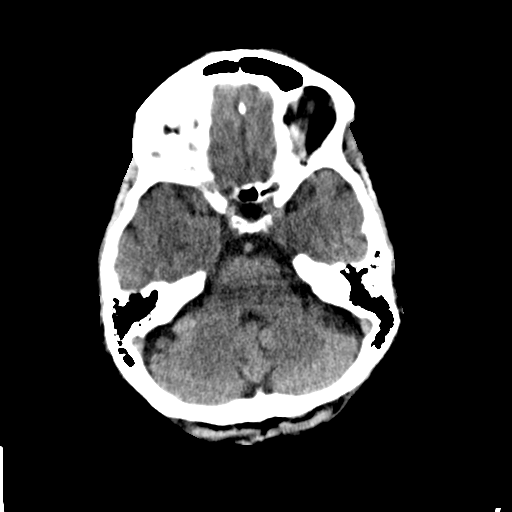
[im 9/32  brain]
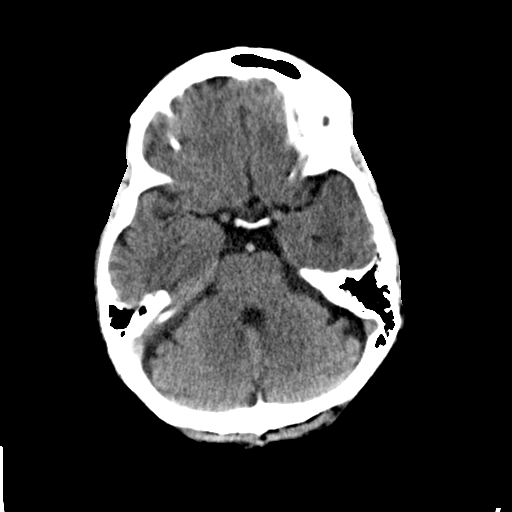
[im 9/32  bone]
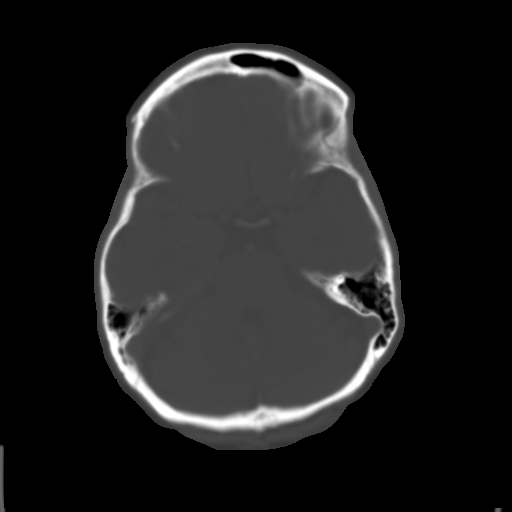
[im 11/32  brain]
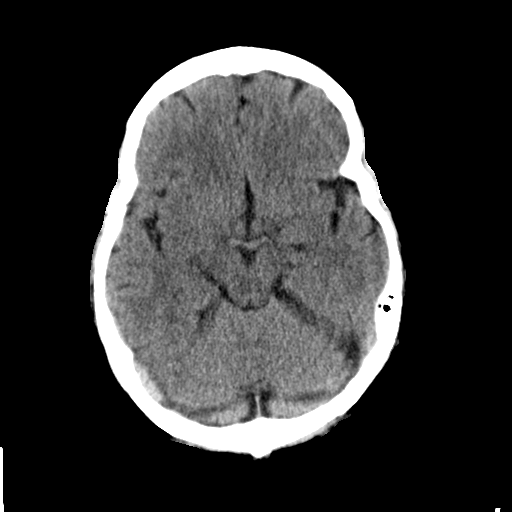
[im 13/32  brain]
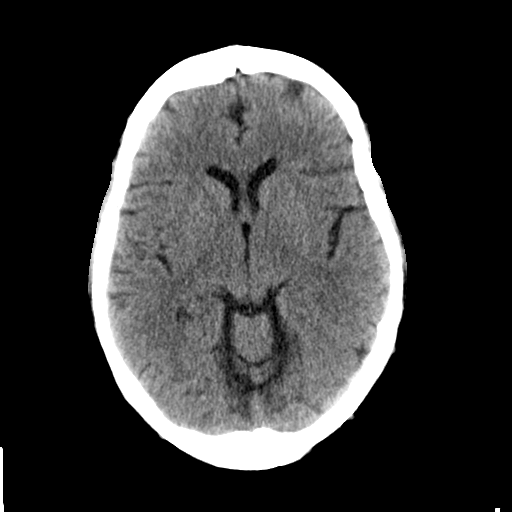
[im 15/32  brain]
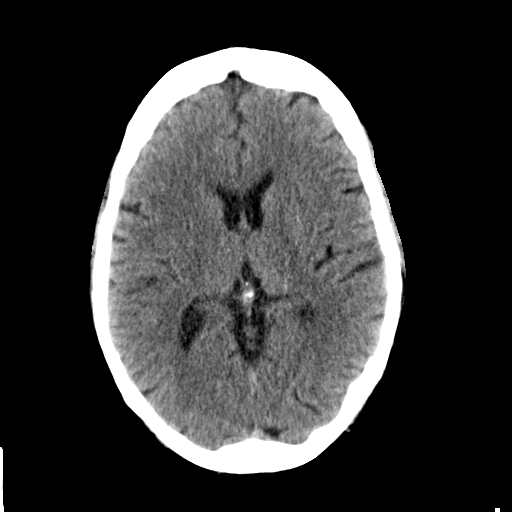
[im 17/32  brain]
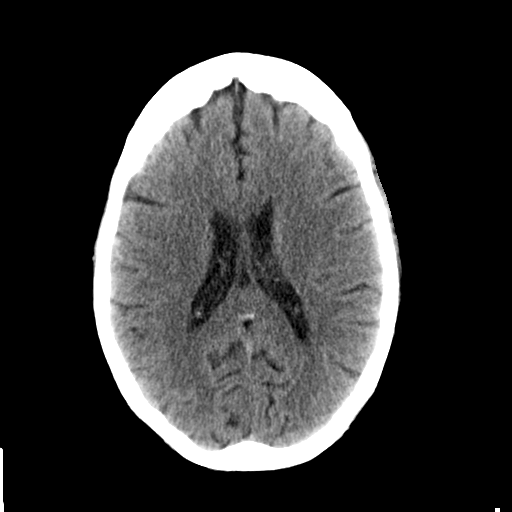
[im 17/32  bone]
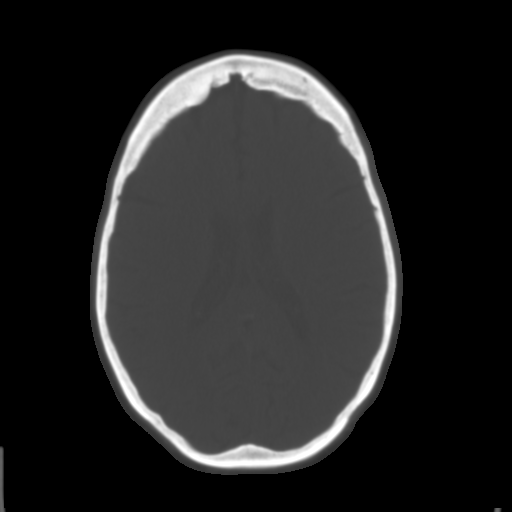
[im 19/32  brain]
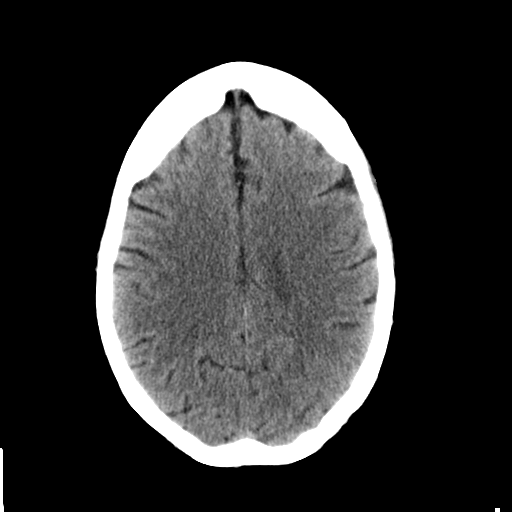
[im 21/32  brain]
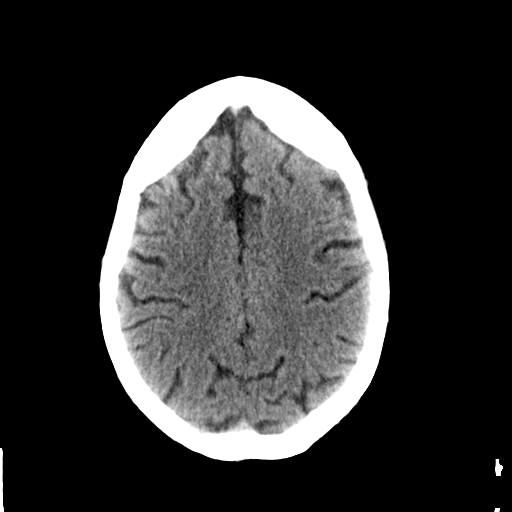
[im 23/32  brain]
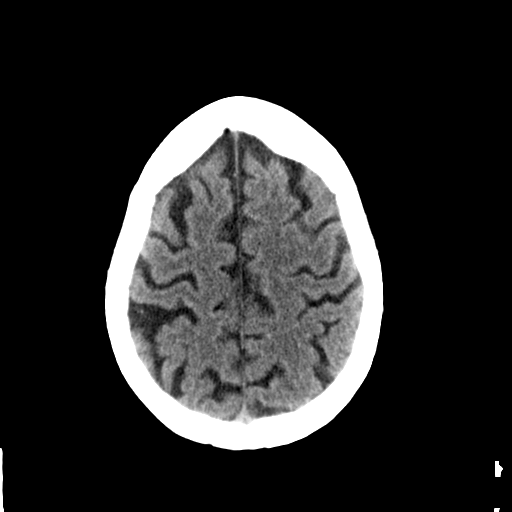
[im 24/32  brain]
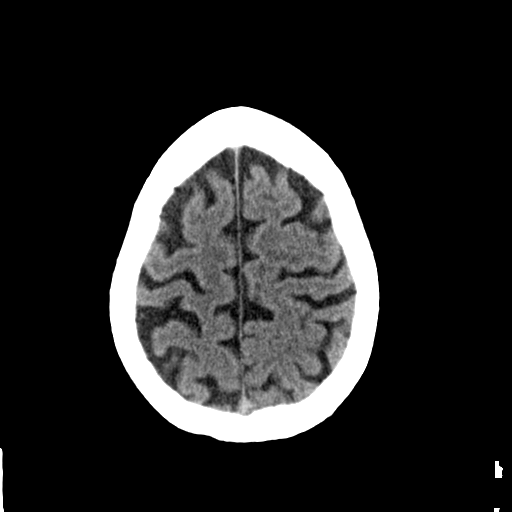
[im 24/32  bone]
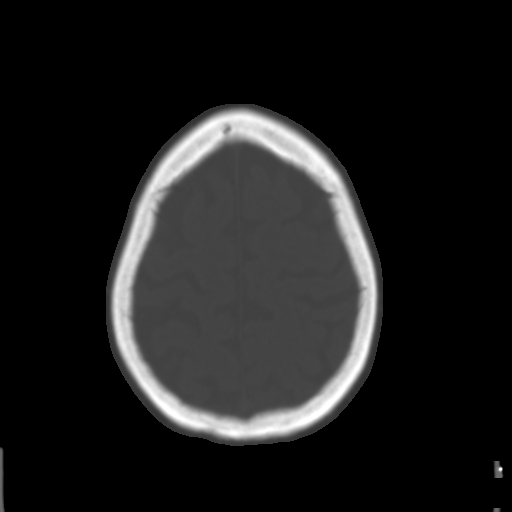
[im 26/32  brain]
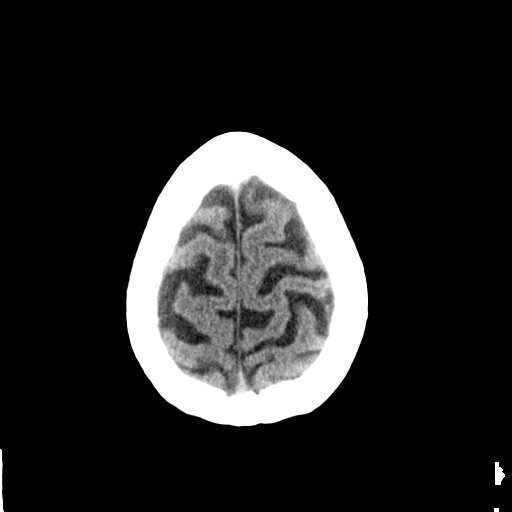
[im 28/32  brain]
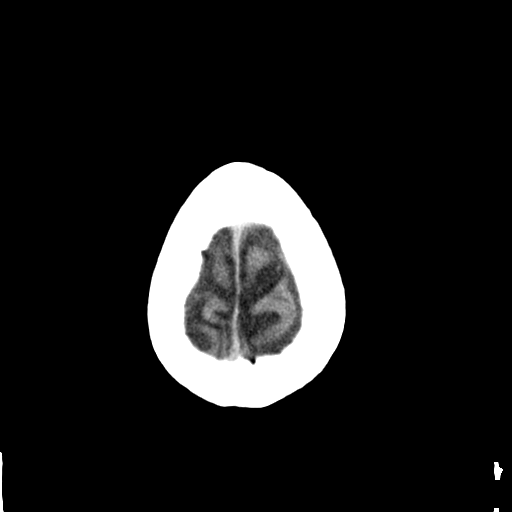
[im 30/32  brain]
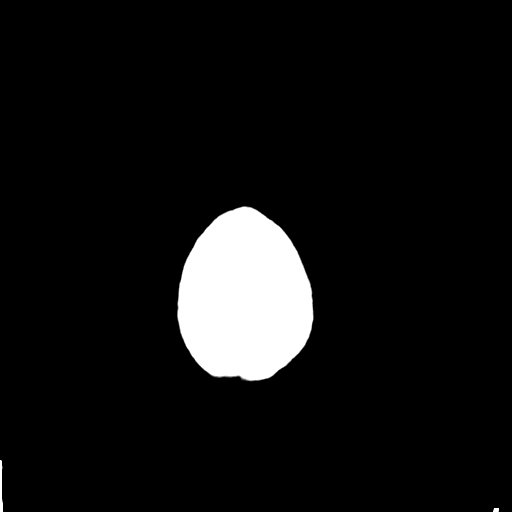

[16 of 30 positions shown; findings below may reference images not displayed]

FINDINGS: There is no evidence of mass effect, midline shift, or extra-axial
fluid collections. There is no evidence of a space-occupying lesion
or intracranial hemorrhage. There is no evidence of a cortical-based
area of acute infarction. There is generalized cerebral atrophy.

The ventricles and sulci are appropriate for the patient's age. The
basal cisterns are patent.

Visualized portions of the orbits are unremarkable. The visualized
portions of the paranasal sinuses and mastoid air cells are
unremarkable.

The osseous structures are unremarkable.
IMPRESSION: No acute intracranial pathology.

## 2016-08-09 ENCOUNTER — Ambulatory Visit (INDEPENDENT_AMBULATORY_CARE_PROVIDER_SITE_OTHER): Payer: Medicare Other

## 2016-08-09 VITALS — BP 130/70 | HR 86 | Temp 97.8°F | Resp 14 | Ht 66.0 in | Wt 185.0 lb

## 2016-08-09 DIAGNOSIS — E2839 Other primary ovarian failure: Secondary | ICD-10-CM

## 2016-08-09 DIAGNOSIS — Z0000 Encounter for general adult medical examination without abnormal findings: Secondary | ICD-10-CM | POA: Diagnosis not present

## 2016-08-09 DIAGNOSIS — Z23 Encounter for immunization: Secondary | ICD-10-CM

## 2016-08-09 DIAGNOSIS — Z1239 Encounter for other screening for malignant neoplasm of breast: Secondary | ICD-10-CM

## 2016-08-09 NOTE — Progress Notes (Signed)
Care was provided under my supervision. I agree with the management as indicated in the note.  Dannah Ryles DO  

## 2016-08-09 NOTE — Progress Notes (Signed)
Subjective:   Pamela Norman is a 75 y.o. female who presents for Medicare Annual (Subsequent) preventive examination.  Review of Systems:  No ROS.  Medicare Wellness Visit.  Cardiac Risk Factors include: advanced age (>53men, >94 women)     Objective:     Vitals: BP 130/70 (BP Location: Left Arm, Patient Position: Sitting, Cuff Size: Normal)   Pulse 86   Temp 97.8 F (36.6 C) (Oral)   Resp 14   Ht 5\' 6"  (1.676 m)   Wt 185 lb (83.9 kg)   SpO2 96%   BMI 29.86 kg/m   Body mass index is 29.86 kg/m.   Tobacco History  Smoking Status  . Former Smoker  . Quit date: 04/13/1981  Smokeless Tobacco  . Never Used     Counseling given: Not Answered   Past Medical History:  Diagnosis Date  . Anxiety   . Cataract   . Depression   . Endometrial cancer ([facility]) 2016   Grade 1,total laparoscopic hysterectomy  . Heart murmur   . History of colonoscopy 2014   within normal limits  . History of mammogram 2015  . History of ovarian cyst 01/2015  . Hyperlipidemia   . Rectal prolapse   . Squamous cell carcinoma ([facility])    face   Past Surgical History:  Procedure Laterality Date  . ABDOMINAL HYSTERECTOMY    . APPENDECTOMY    . BILATERAL SALPINGOOPHORECTOMY  03/30/15  . CATARACT EXTRACTION W/PHACO Right 02/06/2016   Procedure: CATARACT EXTRACTION PHACO AND INTRAOCULAR LENS PLACEMENT (IOC);  Surgeon: Estill Cotta, MD;  Location: [facility] ORS;  Service: Ophthalmology;  Laterality: Right;  [place] 01:39 AP% 24.9 CDE 47.32 fluid pack lot # [id number]  . CATARACT EXTRACTION W/PHACO Left 03/05/2016   Procedure: CATARACT EXTRACTION PHACO AND INTRAOCULAR LENS PLACEMENT ([place]);  Surgeon: Estill Cotta, MD;  Location: [facility] ORS;  Service: Ophthalmology;  Laterality: Left;  [place] 01:02 AP% 23.2 CDE 26.73 fluid pack lot # [id number]  . COMBINED HYSTEROSCOPY DIAGNOSTIC / D&C  2016  . DILATION AND CURETTAGE OF UTERUS    . dnc    . LAPAROSCOPIC HYSTERECTOMY  03/30/15  . NASAL SINUS SURGERY    . SKIN  CANCER EXCISION     UPPER LIP  . TONSILECTOMY, ADENOIDECTOMY, BILATERAL MYRINGOTOMY AND TUBES    . TONSILLECTOMY     Family History  Problem Relation Age of Onset  . Colon cancer Father     older age onset  . COPD Father   . COPD Mother    History  Sexual Activity  . Sexual activity: Not Currently    Outpatient Encounter Prescriptions as of 08/09/2016  Medication Sig  . aspirin 81 MG chewable tablet Chew 81 mg by mouth.  . diazepam (VALIUM) 5 MG tablet Take by mouth.  . diclofenac (VOLTAREN) 75 MG EC tablet Take 1 tablet (75 mg total) by mouth 2 (two) times daily.  . DULoxetine (CYMBALTA) 30 MG capsule Take 60 mg by mouth daily.  . DUREZOL 0.05 % EMUL INT 1 GTT INTO OS BID AFTER PATCH IS REMOVED  . FLUZONE HIGH-DOSE 0.5 ML SUSY ADM 0.5ML IM UTD  . risperiDONE (RISPERDAL) 1 MG tablet    No facility-administered encounter medications on file as of 08/09/2016.     Activities of Daily Living In your present state of health, do you have any difficulty performing the following activities: 08/09/2016  Hearing? N  Vision? N  Difficulty concentrating or making decisions? N  Walking or climbing stairs? N  Dressing or bathing? N  Doing errands, shopping? N  Preparing Food and eating ? N  Using the Toilet? N  In the past six months, have you accidently leaked urine? N  Do you have problems with loss of bowel control? N  Managing your Medications? N  Managing your Finances? N  Housekeeping or managing your Housekeeping? N  Some recent data might be hidden    Patient Care Team: Coral Spikes, DO as PCP - General (Family Medicine)    Assessment:    This is a routine wellness examination for [place]. The goal of the wellness visit is to assist the patient how to close the gaps in care and create a preventative care plan for the patient.   Osteoporosis risk reviewed.  DEXA Scan ordered.  Medications reviewed; taking without issues or barriers.  Safety issues reviewed; smoke  and carbon monoxide detectors in the home. No firearms in the home. Wears seatbelts when driving or riding with others. No violence in the home.  No identified risk were noted; The patient was oriented x 3; appropriate in dress and manner and no objective failures at ADL's or IADL's.   Body mass index; discussed the importance of a healthy diet, water intake and exercise. Educational material provided.  Pneumococcal 23 vaccine administered L deltoid. Tolerated well.   Mammogram ordered.  Patient Concerns: Physical scheduled with PCP.   Exercise Activities and Dietary recommendations Current Exercise Habits: Home exercise routine, Type of exercise: calisthenics, Time (Minutes): 30, Frequency (Times/Week): 3, Weekly Exercise (Minutes/Week): 90, Intensity: Mild  Goals    . Healthy Lifestyle          STAY ACTIVE AND CONTINUE EXERCISE REGIMENT. LOW CARB FOODS. LEAN MEATS, VEGETABLES. STAY HYDRATED AND DRINK PLENTY OF WATER.      Fall Risk Fall Risk  08/09/2016 05/11/2015  Falls in the past year? Yes No  Number falls in past yr: 1 -  Injury with Fall? Yes -  Follow up Falls prevention discussed;Education provided -   Depression Screen PHQ 2/9 Scores 08/09/2016 05/11/2015  PHQ - 2 Score 0 2  PHQ- 9 Score - 3     Cognitive Testing MMSE - Mini Mental State Exam 08/09/2016  Orientation to time 5  Orientation to Place 5  Registration 3  Attention/ Calculation 5  Recall 3  Language- name 2 objects 2  Language- repeat 1  Language- follow 3 step command 3  Language- read & follow direction 1  Write a sentence 1  Copy design 1  Total score 30    Immunization History  Administered Date(s) Administered  . Influenza-Unspecified 01/24/2016  . Pneumococcal Conjugate-13 05/21/2014  . Pneumococcal Polysaccharide-23 08/09/2016   Screening Tests Health Maintenance  Topic Date Due  . TETANUS/TDAP  01/30/1961  . ZOSTAVAX  01/30/2002  . DEXA SCAN  01/30/2007  . MAMMOGRAM   05/20/2015  . INFLUENZA VACCINE  07/10/2016  . COLONOSCOPY  07/17/2023  . PNA vac Low Risk Adult  Completed      Plan:   End of life planning; Advance aging; Advanced directives discussed. No HCPOA/Living Will on file.  Educational material provided.  Copy of completed forms requested.  Follow up with PCP as needed.  Time spent on this topic is 30 minutes.  During the course of the visit the patient was educated and counseled about the following appropriate screening and preventive services:   Vaccines to include Pneumoccal, Influenza, Hepatitis B, Td, Zostavax, HCV  Electrocardiogram  Cardiovascular Disease  Colorectal  cancer screening  Bone density screening  Diabetes screening  Glaucoma screening  Mammography/PAP  Nutrition counseling   Patient Instructions (the written plan) was given to the patient.   Varney Biles, LPN  [id number]

## 2016-08-09 NOTE — Patient Instructions (Addendum)
Pamela Norman , Thank you for taking time to come for your Medicare Wellness Visit. I appreciate your ongoing commitment to your health goals. Please review the following plan we discussed and let me know if I can assist you in the future.   These are the goals we discussed: Goals    . Healthy Lifestyle          STAY ACTIVE AND CONTINUE EXERCISE REGIMENT. LOW CARB FOODS. LEAN MEATS, VEGETABLES. STAY HYDRATED AND DRINK PLENTY OF WATER.       This is a list of the screening recommended for you and due dates:  Health Maintenance  Topic Date Due  . Tetanus Vaccine  01/30/1961  . Shingles Vaccine  01/30/2002  . DEXA scan (bone density measurement)  01/30/2007  . Mammogram  05/20/2015  . Flu Shot  07/10/2016  . Colon Cancer Screening  07/17/2023  . Pneumonia vaccines  Completed    Fall Prevention in the Home  Falls can cause injuries. They can happen to people of all ages. There are many things you can do to make your home safe and to help prevent falls.  WHAT CAN I DO ON THE OUTSIDE OF MY HOME?  Regularly fix the edges of walkways and driveways and fix any cracks.  Remove anything that might make you trip as you walk through a door, such as a raised step or threshold.  Trim any bushes or trees on the path to your home.  Use bright outdoor lighting.  Clear any walking paths of anything that might make someone trip, such as rocks or tools.  Regularly check to see if handrails are loose or broken. Make sure that both sides of any steps have handrails.  Any raised decks and porches should have guardrails on the edges.  Have any leaves, snow, or ice cleared regularly.  Use sand or salt on walking paths during winter.  Clean up any spills in your garage right away. This includes oil or grease spills. WHAT CAN I DO IN THE BATHROOM?   Use night lights.  Install grab bars by the toilet and in the tub and shower. Do not use towel bars as grab bars.  Use non-skid mats or decals  in the tub or shower.  If you need to sit down in the shower, use a plastic, non-slip stool.  Keep the floor dry. Clean up any water that spills on the floor as soon as it happens.  Remove soap buildup in the tub or shower regularly.  Attach bath mats securely with double-sided non-slip rug tape.  Do not have throw rugs and other things on the floor that can make you trip. WHAT CAN I DO IN THE BEDROOM?  Use night lights.  Make sure that you have a light by your bed that is easy to reach.  Do not use any sheets or blankets that are too big for your bed. They should not hang down onto the floor.  Have a firm chair that has side arms. You can use this for support while you get dressed.  Do not have throw rugs and other things on the floor that can make you trip. WHAT CAN I DO IN THE KITCHEN?  Clean up any spills right away.  Avoid walking on wet floors.  Keep items that you use a lot in easy-to-reach places.  If you need to reach something above you, use a strong step stool that has a grab bar.  Keep electrical cords out of  the way.  Do not use floor polish or wax that makes floors slippery. If you must use wax, use non-skid floor wax.  Do not have throw rugs and other things on the floor that can make you trip. WHAT CAN I DO WITH MY STAIRS?  Do not leave any items on the stairs.  Make sure that there are handrails on both sides of the stairs and use them. Fix handrails that are broken or loose. Make sure that handrails are as long as the stairways.  Check any carpeting to make sure that it is firmly attached to the stairs. Fix any carpet that is loose or worn.  Avoid having throw rugs at the top or bottom of the stairs. If you do have throw rugs, attach them to the floor with carpet tape.  Make sure that you have a light switch at the top of the stairs and the bottom of the stairs. If you do not have them, ask someone to add them for you. WHAT ELSE CAN I DO TO HELP  PREVENT FALLS?  Wear shoes that:  Do not have high heels.  Have rubber bottoms.  Are comfortable and fit you well.  Are closed at the toe. Do not wear sandals.  If you use a stepladder:  Make sure that it is fully opened. Do not climb a closed stepladder.  Make sure that both sides of the stepladder are locked into place.  Ask someone to hold it for you, if possible.  Clearly mark and make sure that you can see:  Any grab bars or handrails.  First and last steps.  Where the edge of each step is.  Use tools that help you move around (mobility aids) if they are needed. These include:  Canes.  Walkers.  Scooters.  Crutches.  Turn on the lights when you go into a dark area. Replace any light bulbs as soon as they burn out.  Set up your furniture so you have a clear path. Avoid moving your furniture around.  If any of your floors are uneven, fix them.  If there are any pets around you, be aware of where they are.  Review your medicines with your doctor. Some medicines can make you feel dizzy. This can increase your chance of falling. Ask your doctor what other things that you can do to help prevent falls.   This information is not intended to replace advice given to you by your health care provider. Make sure you discuss any questions you have with your health care provider.   Document Released: 09/22/2009 Document Revised: 04/12/2015 Document Reviewed: 12/31/2014 Elsevier Interactive Patient Education 2016 [place].    Bone Densitometry Bone densitometry is an imaging test that uses a special X-ray to measure the amount of calcium and other minerals in your bones (bone density). This test is also known as a bone mineral density test or dual-energy X-ray absorptiometry (DXA). The test can measure bone density at your hip and your spine. It is similar to having a regular X-ray. You may have this test to:  Diagnose a condition that causes weak or thin bones  (osteoporosis).  Predict your risk of a broken bone (fracture).  Determine how well osteoporosis treatment is working. LET [facility] CARE PROVIDER KNOW ABOUT:  Any allergies you have.  All medicines you are taking, including vitamins, herbs, eye drops, creams, and over-the-counter medicines.  Previous problems you or members of your family have had with the use of anesthetics.  Any blood disorders you have.  Previous surgeries you have had.  Medical conditions you have.  Possibility of pregnancy.  Any other medical test you had within the previous 14 days that used contrast material. RISKS AND COMPLICATIONS Generally, this is a safe procedure. However, problems can occur and may include the following:  This test exposes you to a very small amount of radiation.  The risks of radiation exposure may be greater to unborn children. BEFORE THE PROCEDURE  Do not take any calcium supplements for 24 hours before having the test. You can otherwise eat and drink what you usually do.  Take off all metal jewelry, eyeglasses, dental appliances, and any other metal objects. PROCEDURE  You may lie on an exam table. There will be an X-ray generator below you and an imaging device above you.  Other devices, such as boxes or braces, may be used to position your body properly for the scan.  You will need to lie still while the machine slowly scans your body.  The images will show up on a computer monitor. AFTER THE PROCEDURE You may need more testing at a later time.   This information is not intended to replace advice given to you by your health care provider. Make sure you discuss any questions you have with your health care provider.   Document Released: 12/18/2004 Document Revised: 12/17/2014 Document Reviewed: 05/06/2014 Elsevier Interactive Patient Education 2016 [place] in the Home  Falls can cause injuries. They can happen to people of all ages.  There are many things you can do to make your home safe and to help prevent falls.  WHAT CAN I DO ON THE OUTSIDE OF MY HOME?  Regularly fix the edges of walkways and driveways and fix any cracks.  Remove anything that might make you trip as you walk through a door, such as a raised step or threshold.  Trim any bushes or trees on the path to your home.  Use bright outdoor lighting.  Clear any walking paths of anything that might make someone trip, such as rocks or tools.  Regularly check to see if handrails are loose or broken. Make sure that both sides of any steps have handrails.  Any raised decks and porches should have guardrails on the edges.  Have any leaves, snow, or ice cleared regularly.  Use sand or salt on walking paths during winter.  Clean up any spills in your garage right away. This includes oil or grease spills. WHAT CAN I DO IN THE BATHROOM?   Use night lights.  Install grab bars by the toilet and in the tub and shower. Do not use towel bars as grab bars.  Use non-skid mats or decals in the tub or shower.  If you need to sit down in the shower, use a plastic, non-slip stool.  Keep the floor dry. Clean up any water that spills on the floor as soon as it happens.  Remove soap buildup in the tub or shower regularly.  Attach bath mats securely with double-sided non-slip rug tape.  Do not have throw rugs and other things on the floor that can make you trip. WHAT CAN I DO IN THE BEDROOM?  Use night lights.  Make sure that you have a light by your bed that is easy to reach.  Do not use any sheets or blankets that are too big for your bed. They should not hang down onto the floor.  Have a firm  chair that has side arms. You can use this for support while you get dressed.  Do not have throw rugs and other things on the floor that can make you trip. WHAT CAN I DO IN THE KITCHEN?  Clean up any spills right away.  Avoid walking on wet floors.  Keep items  that you use a lot in easy-to-reach places.  If you need to reach something above you, use a strong step stool that has a grab bar.  Keep electrical cords out of the way.  Do not use floor polish or wax that makes floors slippery. If you must use wax, use non-skid floor wax.  Do not have throw rugs and other things on the floor that can make you trip. WHAT CAN I DO WITH MY STAIRS?  Do not leave any items on the stairs.  Make sure that there are handrails on both sides of the stairs and use them. Fix handrails that are broken or loose. Make sure that handrails are as long as the stairways.  Check any carpeting to make sure that it is firmly attached to the stairs. Fix any carpet that is loose or worn.  Avoid having throw rugs at the top or bottom of the stairs. If you do have throw rugs, attach them to the floor with carpet tape.  Make sure that you have a light switch at the top of the stairs and the bottom of the stairs. If you do not have them, ask someone to add them for you. WHAT ELSE CAN I DO TO HELP PREVENT FALLS?  Wear shoes that:  Do not have high heels.  Have rubber bottoms.  Are comfortable and fit you well.  Are closed at the toe. Do not wear sandals.  If you use a stepladder:  Make sure that it is fully opened. Do not climb a closed stepladder.  Make sure that both sides of the stepladder are locked into place.  Ask someone to hold it for you, if possible.  Clearly mark and make sure that you can see:  Any grab bars or handrails.  First and last steps.  Where the edge of each step is.  Use tools that help you move around (mobility aids) if they are needed. These include:  Canes.  Walkers.  Scooters.  Crutches.  Turn on the lights when you go into a dark area. Replace any light bulbs as soon as they burn out.  Set up your furniture so you have a clear path. Avoid moving your furniture around.  If any of your floors are uneven, fix them.  If  there are any pets around you, be aware of where they are.  Review your medicines with your doctor. Some medicines can make you feel dizzy. This can increase your chance of falling. Ask your doctor what other things that you can do to help prevent falls.   This information is not intended to replace advice given to you by your health care provider. Make sure you discuss any questions you have with your health care provider.   Document Released: 09/22/2009 Document Revised: 04/12/2015 Document Reviewed: 12/31/2014 Elsevier Interactive Patient Education 2016 [place] A mammogram is an X-ray of the breasts that is done to check for abnormal changes. This procedure can screen for and detect any changes that may suggest breast cancer. A mammogram can also identify other changes and variations in the breast, such as:  Inflammation of the breast tissue (mastitis).  An  infected area that contains a collection of pus (abscess).  A fluid-filled sac (cyst).  Fibrocystic changes. This is when breast tissue becomes denser, which can make the tissue feel rope-like or uneven under the skin.  Tumors that are not cancerous (benign). LET [facility] CARE PROVIDER KNOW ABOUT:  Any allergies you have.  If you have breast implants.  If you have had previous breast disease, biopsy, or surgery.  If you are breastfeeding.  Any possibility that you could be pregnant, if this applies.  If you are younger than age 66.  If you have a family history of breast cancer. RISKS AND COMPLICATIONS Generally, this is a safe procedure. However, problems may occur, including:  Exposure to radiation. Radiation levels are very low with this test.  The results being misinterpreted.  The need for further tests.  The inability of the mammogram to detect certain cancers. BEFORE THE PROCEDURE  Schedule your test about 1-2 weeks after your menstrual period. This is usually when your breasts are  the least tender.  If you have had a mammogram done at a different facility in the past, get the mammogram X-rays or have them sent to your current exam facility in order to compare them.  Wash your breasts and under your arms the day of the test.  Do not wear deodorants, perfumes, lotions, or powders anywhere on your body on the day of the test.  Remove any jewelry from your neck.  Wear clothes that you can change into and out of easily. PROCEDURE  You will undress from the waist up and put on a gown.  You will stand in front of the X-ray machine.  Each breast will be placed between two plastic or glass plates. The plates will compress your breast for a few seconds. Try to stay as relaxed as possible during the procedure. This does not cause any harm to your breasts and any discomfort you feel will be very brief.  X-rays will be taken from different angles of each breast. The procedure may vary among health care providers and hospitals. AFTER THE PROCEDURE  The mammogram will be examined by a specialist (radiologist).  You may need to repeat certain parts of the test, depending on the quality of the images. This is commonly done if the radiologist needs a better view of the breast tissue.  Ask when your test results will be ready. Make sure you get your test results.  You may resume your normal activities.   This information is not intended to replace advice given to you by your health care provider. Make sure you discuss any questions you have with your health care provider.   Document Released: 11/23/2000 Document Revised: 08/17/2015 Document Reviewed: 02/04/2015 Elsevier Interactive Patient Education [employer].

## 2016-08-16 DIAGNOSIS — E2839 Other primary ovarian failure: Secondary | ICD-10-CM | POA: Diagnosis not present

## 2016-08-16 DIAGNOSIS — M85862 Other specified disorders of bone density and structure, left lower leg: Secondary | ICD-10-CM | POA: Diagnosis not present

## 2016-08-16 DIAGNOSIS — M85859 Other specified disorders of bone density and structure, unspecified thigh: Secondary | ICD-10-CM | POA: Diagnosis not present

## 2016-08-16 DIAGNOSIS — Z1231 Encounter for screening mammogram for malignant neoplasm of breast: Secondary | ICD-10-CM | POA: Diagnosis not present

## 2016-08-16 LAB — HM MAMMOGRAPHY

## 2016-08-17 ENCOUNTER — Encounter: Payer: Self-pay | Admitting: Family Medicine

## 2016-08-20 NOTE — Telephone Encounter (Signed)
Up to Dr. Nicki Reaper.

## 2016-09-05 ENCOUNTER — Inpatient Hospital Stay: Payer: Medicare Other | Attending: Obstetrics and Gynecology | Admitting: Obstetrics and Gynecology

## 2016-09-05 ENCOUNTER — Encounter: Payer: Self-pay | Admitting: Obstetrics and Gynecology

## 2016-09-05 VITALS — BP 145/84 | HR 87 | Temp 97.0°F | Ht 66.0 in | Wt 186.6 lb

## 2016-09-05 DIAGNOSIS — Z90710 Acquired absence of both cervix and uterus: Secondary | ICD-10-CM | POA: Diagnosis not present

## 2016-09-05 DIAGNOSIS — Z87891 Personal history of nicotine dependence: Secondary | ICD-10-CM | POA: Diagnosis not present

## 2016-09-05 DIAGNOSIS — F329 Major depressive disorder, single episode, unspecified: Secondary | ICD-10-CM | POA: Diagnosis not present

## 2016-09-05 DIAGNOSIS — Z8542 Personal history of malignant neoplasm of other parts of uterus: Secondary | ICD-10-CM | POA: Diagnosis not present

## 2016-09-05 DIAGNOSIS — Z90722 Acquired absence of ovaries, bilateral: Secondary | ICD-10-CM | POA: Diagnosis not present

## 2016-09-05 DIAGNOSIS — Z79899 Other long term (current) drug therapy: Secondary | ICD-10-CM | POA: Diagnosis not present

## 2016-09-05 DIAGNOSIS — E785 Hyperlipidemia, unspecified: Secondary | ICD-10-CM | POA: Insufficient documentation

## 2016-09-05 DIAGNOSIS — K623 Rectal prolapse: Secondary | ICD-10-CM | POA: Diagnosis not present

## 2016-09-05 DIAGNOSIS — Z923 Personal history of irradiation: Secondary | ICD-10-CM | POA: Insufficient documentation

## 2016-09-05 DIAGNOSIS — C541 Malignant neoplasm of endometrium: Secondary | ICD-10-CM

## 2016-09-05 DIAGNOSIS — Z7189 Other specified counseling: Secondary | ICD-10-CM | POA: Insufficient documentation

## 2016-09-05 DIAGNOSIS — F449 Dissociative and conversion disorder, unspecified: Secondary | ICD-10-CM

## 2016-09-05 NOTE — Progress Notes (Signed)
  Oncology Nurse Navigator Documentation   Chaperoned pelvic exam                                                C

## 2016-09-05 NOTE — Progress Notes (Signed)
Patient here for follow up no changes since last appointment. BP slightly elevated today patient states it is usually normal.

## 2016-09-05 NOTE — Progress Notes (Signed)
Gynecologic Oncology Interval Note  Referring Provider: Dr. Hassell Done Defrancesco  Chief Concern: Endometrial cancer surveillance  Subjective:  Pamela Norman is a 75 y.o. female with stage IA grade 1 endometrial cancer.    No complaints today. Returns for routine follow up. She was last seen by Dr. Baruch Gouty on 05/21/2016 and she had a negative exam. He released her from clinic.   Dr. Lacinda Axon orders her mammograms which she just had on 08/2016.  HPI Please refer to prior notes for complete details. On 03/30/15 she underwent total laparoscopic hysterectomy, bilateral salpingo-oophorectomy, pelvic sentinel lymph node mapping, and repair of vaginal lacerations caused by removal of the uterus transvaginally.  Her final pathology as noted below.  Sentinel nodes were not identified and node dissection not done since frozen showed grade 1 tumor invading less than 50%.  Since final path showed LVSI she was referred to Dr. Baruch Gouty for vaginal brachytherapy. She tolerated 6 HDR treatments well.      Surgical Procedure  CASE: [id number]  PATIENT: Pamela Norman  Surgical Pathology Report      SPECIMEN SUBMITTED:  A. Uterus, cervix, and bilateral tubes and ovaries   CLINICAL HISTORY:  None provided   PRE-OPERATIVE DIAGNOSIS:  grade 1 endometrioid endometrial cancer   POST-OPERATIVE DIAGNOSIS:  Same/ total laparoscopic hysterectomy, bilateral salpingo-oophorectomy      DIAGNOSIS:  A. UTERUS; HYSTERECTOMY:  - ENDOMETRIOID CARCINOMA WITH SQUAMOUS DIFFERENTIATION, GRADE 1,  INVADING LESS THAN ONE HALF OF MYOMETRIUM.  - CERVIX NOT INVOLVED.  - INTRAMURAL LEIOMYOMA.   RIGHT AND LEFT FALLOPIAN TUBES AND OVARIES; SALPINGO-OOPHORECTOMY:  - NOT INVOLVED.  - BENIGN SIMPLE CYST OF RIGHT OVARY, 3.2 CM.   Comment:  The cervix was received in two detached pieces. The corpus was received  intact.   ENDOMETRIUM: Hysterectomy, With or Without Other Organs or Tissues  Endometrium,  Hysterectomy, With or Without Other Organs or Tissues  Cancer Case Summary  Specimen: Uterine corpus  SPECIMEN  Procedure  Simple hysterectomy  Additional Procedures:  Bilateral salpingo-oophorectomy  Lymph Node Sampling:   Not performed  Specimen Integrity: Intact hysterectomy specimen  TUMOR  Histologic Type:  Endometrioid adenocarcinoma, variant (specify)  with squamous differentiation  Histologic Grade:  FIGO grade 1  EXTENT  Tumor Size:  Greatest dimension (cm)  3.5cm  Myometrial Invasion:   Present  Depth of Myometrial Invasion: Specify depth of invasion (mm)  87mm  Myometrial Thickness (mm):  69mm  Involvement of Cervix:  Not involved  Other Organs Submitted: Right ovary  Not involved  Left ovary  Not involved  Right fallopian tube  Not involved  Left fallopian tube  Not involved  ACCESSORY FINDINGS  Lymph-Vascular Invasion: Present  STAGE (pTNM [FIGO])  Primary Tumor (pT):  pT1a [IA]: Tumor limited to endometrium or invades less than one-half  of the myometrium  Regional Lymph Nodes (pN)  pNX: Cannot be assessed  Pelvic Lymph Nodes: No pelvic nodes submitted or found  Para-aortic Lymph Nodes: No para-aortic nodes submitted or found  Distant Metastasis (pM): Not applicable            Cytology: DIAGNOSIS:  A. PELVIC WASHINGS:  - NEGATIVE FOR MALIGNANCY.    Problem List: Patient Active Problem List   Diagnosis Date Noted  . Pain of left lower extremity 06/24/2016  . Conversion disorder 05/26/2015  . Endometrial cancer ([place]) 05/04/2015  . Other and unspecified hyperlipidemia 05/21/2014  . Medicare annual wellness visit, subsequent 05/19/2013    Past Medical History: Past Medical History:  Diagnosis  Date  . Anxiety   . Cataract   . Depression   . Endometrial cancer ([place]) 2016   Grade 1,total laparoscopic hysterectomy  . Heart murmur   . History of colonoscopy 2014   within normal limits  . History of mammogram 2015  . History of  ovarian cyst 01/2015  . Hyperlipidemia   . Rectal prolapse   . Squamous cell carcinoma ([facility])    face    Past Surgical History: Past Surgical History:  Procedure Laterality Date  . ABDOMINAL HYSTERECTOMY    . APPENDECTOMY    . BILATERAL SALPINGOOPHORECTOMY  03/30/15  . CATARACT EXTRACTION W/PHACO Right 02/06/2016   Procedure: CATARACT EXTRACTION PHACO AND INTRAOCULAR LENS PLACEMENT (IOC);  Surgeon: Estill Cotta, MD;  Location: [facility] ORS;  Service: Ophthalmology;  Laterality: Right;  [place] 01:39 AP% 24.9 CDE 47.32 fluid pack lot # [id number]  . CATARACT EXTRACTION W/PHACO Left 03/05/2016   Procedure: CATARACT EXTRACTION PHACO AND INTRAOCULAR LENS PLACEMENT ([place]);  Surgeon: Estill Cotta, MD;  Location: [facility] ORS;  Service: Ophthalmology;  Laterality: Left;  [place] 01:02 AP% 23.2 CDE 26.73 fluid pack lot # [id number]  . COMBINED HYSTEROSCOPY DIAGNOSTIC / D&C  2016  . DILATION AND CURETTAGE OF UTERUS    . dnc    . LAPAROSCOPIC HYSTERECTOMY  03/30/15  . NASAL SINUS SURGERY    . SKIN CANCER EXCISION     UPPER LIP  . TONSILECTOMY, ADENOIDECTOMY, BILATERAL MYRINGOTOMY AND TUBES    . TONSILLECTOMY       OB History:  OB History  Gravida Para Term Preterm AB Living  0 0 0 0 0 0  SAB TAB Ectopic Multiple Live Births  0 0 0 0        Obstetric Comments  Age at first menarche-age [age]    Family History: Family History  Problem Relation Age of Onset  . Colon cancer Father     older age onset  . COPD Father   . COPD Mother     Social History: Social History   Social History  . Marital status: Single    Spouse name: N/A  . Number of children: 0  . Years of education: PhD   Occupational History  . Retired    Social History Main Topics  . Smoking status: Former Smoker    Quit date: 04/13/1981  . Smokeless tobacco: Never Used  . Alcohol use Yes     Comment: Occasional wine.  . Drug use: No  . Sexual activity: Not Currently   Other Topics Concern  . Not on file    Social History Narrative   Lives in [place]. No children.      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      1-2 cups caffeine daily.    Allergies: No Known Allergies  Current Medications: Current Outpatient Prescriptions  Medication Sig Dispense Refill  . DULoxetine (CYMBALTA) 30 MG capsule Take 60 mg by mouth daily.    . risperiDONE (RISPERDAL) 1 MG tablet      No current facility-administered medications for this visit.     Review of Systems General: no complaints  HEENT: no complaints  Lungs: no complaints  Cardiac: no complaints  GI: no complaints  GU: no complaints  Musculoskeletal: no complaints  Extremities: no complaints  Skin: no complaints  Neuro: no complaints  Endocrine: no  complaints  Psych: no complaints       Objective:   Vitals:   09/05/16 1047  BP: (!) 145/84  Pulse: 87  Temp: 97 F (36.1 C)  TempSrc: Tympanic  Weight: 186 lb 9.9 oz (84.6 kg)  Height: 5\' 6"  (1.676 m)  Body mass index is 30.12 kg/m.     ECOG Performance Status: 1 - Symptomatic but completely ambulatory  General appearance: alert, cooperative and appears stated age 52: ATNC Neck: normal Lungs: B CTA Heart: Normal rate and rhythm Abdomen: no palpable masses, no hernias, well healed incisions, soft, nontender, nondistended. Umbilicus normal.  Back: normal to inspection Extremities: no lower extremity edema Neurological exam reveals alert, oriented, normal speech, no focal findings or movement disorder noted  Pelvic: EGBUS: normal, Vagina: well healed, no lesions, Bimanual: no masses or nodularity.  RV: confirms     Assessment:  Pamela Norman is a 75 y.o. female diagnosed with stage IA, grade 1, endometrioid adenocarcinoma of the endometrium S/P TLH/BSO in 4/16 and vaginal brachytherapy due to 4/9 mm invasion and LVSI. NED. Body mass index is 30.12  kg/m.    Plan:   Problem List Items Addressed This Visit      Genitourinary   Endometrial cancer ([place]) - Primary    Other Visit Diagnoses    Other specified counseling         I have recommended continued close follow up with exams, including pelvic exams every 3-4 months for 2-3 years, then every 6-12 months for 3-5 years and then annually thereafter.  Imaging and laboratory assessment is based on clinical indication. She will see Dr Enzo Bi in 3 months and she will need to make that appointment. We will see her again in 6 months.  She will return sooner if there are any concerning symptoms.   Patient education for obesity, lifestyle, exercise, nutrition, sexual health, vaginal lubricants. We discussed her weight and need for weight loss, stressed good nutrition, and exercise. She is not a smoker. I provided information regarding calorie counting and exercise for weight loss. I offered referral to the CARE program and provided a pamphlet today.   Gillis Ends, MD   CC:  Referring Provider: Dr. Hassell Done Defrancesco

## 2016-09-05 NOTE — Patient Instructions (Addendum)
Please make appointment to see Dr Enzo Bi in 3 months.    Exercising to Lose Weight Exercising can help you to lose weight. In order to lose weight through exercise, you need to do vigorous-intensity exercise. You can tell that you are exercising with vigorous intensity if you are breathing very hard and fast and cannot hold a conversation while exercising. Moderate-intensity exercise helps to maintain your current weight. You can tell that you are exercising at a moderate level if you have a higher heart rate and faster breathing, but you are still able to hold a conversation. HOW OFTEN SHOULD I EXERCISE? Choose an activity that you enjoy and set realistic goals. Your health care provider can help you to make an activity plan that works for you. Exercise regularly as directed by your health care provider. This may include:  Doing resistance training twice each week, such as:  Push-ups.  Sit-ups.  Lifting weights.  Using resistance bands.  Doing a given intensity of exercise for a given amount of time. Choose from these options:  150 minutes of moderate-intensity exercise every week.  75 minutes of vigorous-intensity exercise every week.  A mix of moderate-intensity and vigorous-intensity exercise every week. Children, pregnant women, people who are out of shape, people who are overweight, and older adults may need to consult a health care provider for individual recommendations. If you have any sort of medical condition, be sure to consult your health care provider before starting a new exercise program. WHAT ARE SOME ACTIVITIES THAT CAN HELP ME TO LOSE WEIGHT?   Walking at a rate of at least 4.5 miles an hour.  Jogging or running at a rate of 5 miles per hour.  Biking at a rate of at least 10 miles per hour.  Lap swimming.  Roller-skating or in-line skating.  Cross-country skiing.  Vigorous competitive sports, such as football, basketball, and soccer.  Jumping  rope.  Aerobic dancing. HOW CAN I BE MORE ACTIVE IN MY DAY-TO-DAY ACTIVITIES?  Use the stairs instead of the elevator.  Take a walk during your lunch break.  If you drive, park your car farther away from work or school.  If you take public transportation, get off one stop early and walk the rest of the way.  Make all of your phone calls while standing up and walking around.  Get up, stretch, and walk around every 30 minutes throughout the day. WHAT GUIDELINES SHOULD I FOLLOW WHILE EXERCISING?  Do not exercise so much that you hurt yourself, feel dizzy, or get very short of breath.  Consult your health care provider prior to starting a new exercise program.  Wear comfortable clothes and shoes with good support.  Drink plenty of water while you exercise to prevent dehydration or heat stroke. Body water is lost during exercise and must be replaced.  Work out until you breathe faster and your heart beats faster.   This information is not intended to replace advice given to you by your health care provider. Make sure you discuss any questions you have with your health care provider.   Document Released: 12/29/2010 Document Revised: 12/17/2014 Document Reviewed: 04/29/2014 Elsevier Interactive Patient Education 2016 [place] for [employer] Calories are energy you get from the things you eat and drink. Your body uses this energy to keep you going throughout the day. The number of calories you eat affects your weight. When you eat more calories than your body needs, your body stores the extra  calories as fat. When you eat fewer calories than your body needs, your body burns fat to get the energy it needs. Calorie counting means keeping track of how many calories you eat and drink each day. If you make sure to eat fewer calories than your body needs, you should lose weight. In order for calorie counting to work, you will need to eat the number of calories that are  right for you in a day to lose a healthy amount of weight per week. A healthy amount of weight to lose per week is usually 1-2 lb (0.5-0.9 kg). A dietitian can determine how many calories you need in a day and give you suggestions on how to reach your calorie goal.   WHAT DO I NEED TO KNOW ABOUT CALORIE COUNTING? In order to meet your daily calorie goal, you will need to:  Find out how many calories are in each food you would like to eat. Try to do this before you eat.  Decide how much of the food you can eat.  Write down what you ate and how many calories it had. Doing this is called keeping a food log. WHERE DO I FIND CALORIE INFORMATION? The number of calories in a food can be found on a Nutrition Facts label. Note that all the information on a label is based on a specific serving of the food. If a food does not have a Nutrition Facts label, try to look up the calories online or ask your dietitian for help. HOW DO I DECIDE HOW MUCH TO EAT? To decide how much of the food you can eat, you will need to consider both the number of calories in one serving and the size of one serving. This information can be found on the Nutrition Facts label. If a food does not have a Nutrition Facts label, look up the information online or ask your dietitian for help. Remember that calories are listed per serving. If you choose to have more than one serving of a food, you will have to multiply the calories per serving by the amount of servings you plan to eat. For example, the label on a package of bread might say that a serving size is 1 slice and that there are 90 calories in a serving. If you eat 1 slice, you will have eaten 90 calories. If you eat 2 slices, you will have eaten 180 calories. HOW DO I KEEP A FOOD LOG? After each meal, record the following information in your food log:  What you ate.  How much of it you ate.  How many calories it had.  Then, add up your calories. Keep your food log near you,  such as in a small notebook in your pocket. Another option is to use a mobile app or website. Some programs will calculate calories for you and show you how many calories you have left each time you add an item to the log. WHAT ARE SOME CALORIE COUNTING TIPS?  Use your calories on foods and drinks that will fill you up and not leave you hungry. Some examples of this include foods like nuts and nut butters, vegetables, lean proteins, and high-fiber foods (more than 5 g fiber per serving).  Eat nutritious foods and avoid empty calories. Empty calories are calories you get from foods or beverages that do not have many nutrients, such as candy and soda. It is better to have a nutritious high-calorie food (such as an avocado) than a food  with few nutrients (such as a bag of chips).  Know how many calories are in the foods you eat most often. This way, you do not have to look up how many calories they have each time you eat them.  Look out for foods that may seem like low-calorie foods but are really high-calorie foods, such as baked goods, soda, and fat-free candy.  Pay attention to calories in drinks. Drinks such as sodas, specialty coffee drinks, alcohol, and juices have a lot of calories yet do not fill you up. Choose low-calorie drinks like water and diet drinks.  Focus your calorie counting efforts on higher calorie items. Logging the calories in a garden salad that contains only vegetables is less important than calculating the calories in a milk shake.  Find a way of tracking calories that works for you. Get creative. Most people who are successful find ways to keep track of how much they eat in a day, even if they do not count every calorie. WHAT ARE SOME PORTION CONTROL TIPS?  Know how many calories are in a serving. This will help you know how many servings of a certain food you can have.  Use a measuring cup to measure serving sizes. This is helpful when you start out. With time, you will  be able to estimate serving sizes for some foods.  Take some time to put servings of different foods on your favorite plates, bowls, and cups so you know what a serving looks like.  Try not to eat straight from a bag or box. Doing this can lead to overeating. Put the amount you would like to eat in a cup or on a plate to make sure you are eating the right portion.  Use smaller plates, glasses, and bowls to prevent overeating. This is a quick and easy way to practice portion control. If your plate is smaller, less food can fit on it.  Try not to multitask while eating, such as watching TV or using your computer. If it is time to eat, sit down at a table and enjoy your food. Doing this will help you to start recognizing when you are full. It will also make you more aware of what and how much you are eating. HOW CAN I CALORIE COUNT WHEN EATING OUT?  Ask for smaller portion sizes or child-sized portions.  Consider sharing an entree and sides instead of getting your own entree.  If you get your own entree, eat only half. Ask for a box at the beginning of your meal and put the rest of your entree in it so you are not tempted to eat it.  Look for the calories on the menu. If calories are listed, choose the lower calorie options.  Choose dishes that include vegetables, fruits, whole grains, low-fat dairy products, and lean protein. Focusing on smart food choices from each of the 5 food groups can help you stay on track at restaurants.  Choose items that are boiled, broiled, grilled, or steamed.  Choose water, milk, unsweetened iced tea, or other drinks without added sugars. If you want an alcoholic beverage, choose a lower calorie option. For example, a regular margarita can have up to 700 calories and a glass of wine has around 150.  Stay away from items that are buttered, battered, fried, or served with cream sauce. Items labeled "crispy" are usually fried, unless stated otherwise.  Ask for  dressings, sauces, and syrups on the side. These are usually very high in  calories, so do not eat much of them.  Watch out for salads. Many people think salads are a healthy option, but this is often not the case. Many salads come with bacon, fried chicken, lots of cheese, fried chips, and dressing. All of these items have a lot of calories. If you want a salad, choose a garden salad and ask for grilled meats or steak. Ask for the dressing on the side, or ask for olive oil and vinegar or lemon to use as dressing.  Estimate how many servings of a food you are given. For example, a serving of cooked rice is  cup or about the size of half a tennis ball or one cupcake wrapper. Knowing serving sizes will help you be aware of how much food you are eating at restaurants. The list below tells you how big or small some common portion sizes are based on everyday objects.  1 oz--4 stacked dice.  3 oz--1 deck of cards.  1 tsp--1 dice.  1 Tbsp-- a Ping-Pong ball.  2 Tbsp--1 Ping-Pong ball.   cup--1 tennis ball or 1 cupcake wrapper.  1 cup--1 baseball.   This information is not intended to replace advice given to you by your health care provider. Make sure you discuss any questions you have with your health care provider.   Document Released: 11/26/2005 Document Revised: 12/17/2014 Document Reviewed: 10/01/2013 Elsevier Interactive Patient Education [employer].

## 2016-09-12 ENCOUNTER — Ambulatory Visit: Payer: Medicare Other

## 2016-09-17 ENCOUNTER — Encounter: Payer: Medicare Other | Admitting: Family Medicine

## 2016-09-17 ENCOUNTER — Encounter: Payer: Self-pay | Admitting: Family Medicine

## 2016-09-17 ENCOUNTER — Ambulatory Visit (INDEPENDENT_AMBULATORY_CARE_PROVIDER_SITE_OTHER): Payer: Medicare Other | Admitting: Family Medicine

## 2016-09-17 VITALS — BP 130/82 | HR 100 | Temp 97.8°F | Wt 182.5 lb

## 2016-09-17 DIAGNOSIS — R739 Hyperglycemia, unspecified: Secondary | ICD-10-CM | POA: Diagnosis not present

## 2016-09-17 DIAGNOSIS — F411 Generalized anxiety disorder: Secondary | ICD-10-CM | POA: Diagnosis not present

## 2016-09-17 DIAGNOSIS — E559 Vitamin D deficiency, unspecified: Secondary | ICD-10-CM

## 2016-09-17 DIAGNOSIS — Z130 Encounter for screening for diseases of the blood and blood-forming organs and certain disorders involving the immune mechanism: Secondary | ICD-10-CM

## 2016-09-17 DIAGNOSIS — M8580 Other specified disorders of bone density and structure, unspecified site: Secondary | ICD-10-CM | POA: Insufficient documentation

## 2016-09-17 DIAGNOSIS — Z8659 Personal history of other mental and behavioral disorders: Secondary | ICD-10-CM | POA: Insufficient documentation

## 2016-09-17 DIAGNOSIS — E785 Hyperlipidemia, unspecified: Secondary | ICD-10-CM

## 2016-09-17 DIAGNOSIS — R7989 Other specified abnormal findings of blood chemistry: Secondary | ICD-10-CM | POA: Diagnosis not present

## 2016-09-17 DIAGNOSIS — F29 Unspecified psychosis not due to a substance or known physiological condition: Secondary | ICD-10-CM | POA: Diagnosis not present

## 2016-09-17 DIAGNOSIS — F23 Brief psychotic disorder: Secondary | ICD-10-CM | POA: Diagnosis not present

## 2016-09-17 LAB — COMPREHENSIVE METABOLIC PANEL
ALBUMIN: 4.2 g/dL (ref 3.5–5.2)
ALT: 20 U/L (ref 0–35)
AST: 14 U/L (ref 0–37)
Alkaline Phosphatase: 57 U/L (ref 39–117)
BUN: 14 mg/dL (ref 6–23)
CALCIUM: 9.6 mg/dL (ref 8.4–10.5)
CHLORIDE: 102 meq/L (ref 96–112)
CO2: 28 meq/L (ref 19–32)
CREATININE: 0.88 mg/dL (ref 0.40–1.20)
GFR: 66.65 mL/min (ref >60.00)
Glucose, Bld: 163 mg/dL — ABNORMAL HIGH (ref 70–99)
POTASSIUM: 4.3 meq/L (ref 3.5–5.1)
SODIUM: 138 meq/L (ref 135–145)
Total Bilirubin: 0.5 mg/dL (ref 0.2–1.2)
Total Protein: 7.5 g/dL (ref 6.0–8.3)

## 2016-09-17 LAB — LIPID PANEL
CHOL/HDL RATIO: 8
CHOLESTEROL: 263 mg/dL — AB (ref 0–200)
HDL: 30.9 mg/dL — ABNORMAL LOW (ref >39.00)
NONHDL: 231.69
TRIGLYCERIDES: 228 mg/dL — AB (ref 0.0–149.0)
VLDL: 45.6 mg/dL — AB (ref 0.0–40.0)

## 2016-09-17 LAB — VITAMIN D 25 HYDROXY (VIT D DEFICIENCY, FRACTURES): VITD: 18.44 ng/mL — ABNORMAL LOW (ref 30.00–100.00)

## 2016-09-17 LAB — HEMOGLOBIN A1C: Hgb A1c MFr Bld: 6.1 % (ref 4.6–6.5)

## 2016-09-17 LAB — LDL CHOLESTEROL, DIRECT: Direct LDL: 190 mg/dL

## 2016-09-17 NOTE — Assessment & Plan Note (Signed)
Doing well at this time. Continue Risperdal and Cymbalta.

## 2016-09-17 NOTE — Progress Notes (Signed)
Subjective:  Patient ID: Pamela Norman, female    DOB: 1941/09/15  Age: 75 y.o. MRN: [id number]  CC: Follow up  HPI:  75 year female with a history of psychosis, HLD, Osteopenia presents for follow up.  Psychosis  Patient has been stable since her hospitalization.  She is currently on Cymbalta and Risperdal.  HLD  Uncontrolled and untreated.  In need of lipid panel.  Osteopenia  Noted on recent dexa.  Patient not currently on Ca/Vit D.  Vitamin D deficiency  Unsure of status.  Needs lab.  No currently on Vit D.  Social Hx   Social History   Social History  . Marital status: Single    Spouse name: N/A  . Number of children: 0  . Years of education: PhD   Occupational History  . Retired    Social History Main Topics  . Smoking status: Former Smoker    Quit date: 04/13/1981  . Smokeless tobacco: Never Used  . Alcohol use Yes     Comment: Occasional wine.  . Drug use: No  . Sexual activity: Not Currently   Other Topics Concern  . None   Social History Narrative   Lives in [place]. No children.      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      1-2 cups caffeine daily.    Review of Systems  Constitutional: Negative.   Psychiatric/Behavioral: Negative.    Objective:  BP 130/82 (BP Location: Right Arm, Patient Position: Sitting, Cuff Size: Normal)   Pulse 100   Temp 97.8 F (36.6 C) (Oral)   Wt 182 lb 8 oz (82.8 kg)   SpO2 94%   BMI 29.46 kg/m   BP/Weight 09/17/2016 09/05/2016 0000000  Systolic BP [id number]  Diastolic BP 82 84 70  Wt. (Lbs) 182.5 186.62 185  BMI 29.46 30.12 29.86   Physical Exam  Constitutional: She appears well-developed. No distress.  Cardiovascular: Normal rate and regular rhythm.   Pulmonary/Chest: Effort normal and breath sounds normal.  Neurological: She is alert.  Psychiatric:  Flat affect.   Vitals  reviewed.  Lab Results  Component Value Date   WBC 7.7 07/29/2015   HGB 14.6 07/29/2015   HCT 44.4 07/29/2015   PLT 236 07/29/2015   GLUCOSE 142 (H) 07/29/2015   CHOL 230 (H) 05/21/2014   TRIG 201.0 (H) 05/21/2014   HDL 35.50 (L) 05/21/2014   LDLDIRECT 181.1 04/13/2013   LDLCALC 154 (H) 05/21/2014   ALT 20 07/29/2015   AST 23 07/29/2015   NA 142 07/29/2015   K 3.8 07/29/2015   CL 105 07/29/2015   CREATININE 0.81 07/29/2015   BUN 19 07/29/2015   CO2 30 07/29/2015   TSH 3.26 05/25/2015   HGBA1C 6.1 05/26/2015    Assessment & Plan:   Problem List Items Addressed This Visit    Hyperlipidemia    Uncontrolled and untreated. ASCVD risk score 16.6%. Will recommend statin therapy and daily aspirin.      Relevant Orders   Lipid Profile   Osteopenia    Stable. Advised Vitamin D & Calcium.      Relevant Orders   Vitamin D (25 hydroxy)   Psychosis - Primary    Doing well at this time. Continue Risperdal and Cymbalta.      Vitamin D deficiency    Rechecking  today.      Relevant Orders   Vitamin D (25 hydroxy)    Other Visit Diagnoses    Blood glucose elevated       Relevant Orders   Comprehensive metabolic panel   Hemoglobin A1c   Screening for deficiency anemia         Follow-up: 6 months to 1 year.   [place]

## 2016-09-17 NOTE — Assessment & Plan Note (Signed)
Rechecking today

## 2016-09-17 NOTE — Assessment & Plan Note (Signed)
Uncontrolled and untreated. ASCVD risk score 16.6%. Will recommend statin therapy and daily aspirin.

## 2016-09-17 NOTE — Progress Notes (Signed)
Pre visit review using our clinic review tool, if applicable. No additional management support is needed unless otherwise documented below in the visit note. 

## 2016-09-17 NOTE — Patient Instructions (Signed)
We will call with your lab results.  Be sure to take calcium and vitamin D daily (Calcium 1200 mg total including dietary sources, 2000 IU Vitamin D3 daily).  Follow up 6 months to 1 year.  Take care  Dr. Lacinda Axon

## 2016-09-17 NOTE — Assessment & Plan Note (Signed)
Stable. Advised Vitamin D & Calcium.

## 2016-09-19 ENCOUNTER — Other Ambulatory Visit: Payer: Self-pay | Admitting: Family Medicine

## 2016-09-19 MED ORDER — ATORVASTATIN CALCIUM 20 MG PO TABS: 20.0000 mg | ORAL_TABLET | Freq: Every day | ORAL | 3 refills | Status: DC

## 2016-10-03 IMAGING — CR DG SHOULDER 2+V*L*
3 series · 3 of 3 positions shown · non-contrast
Comparison: None.

CLINICAL DATA: Shoulder pain. Acute onset of symptoms today.
Initial encounter.

EXAM:
LEFT SHOULDER - 2+ VIEW

[shoulder grashey]
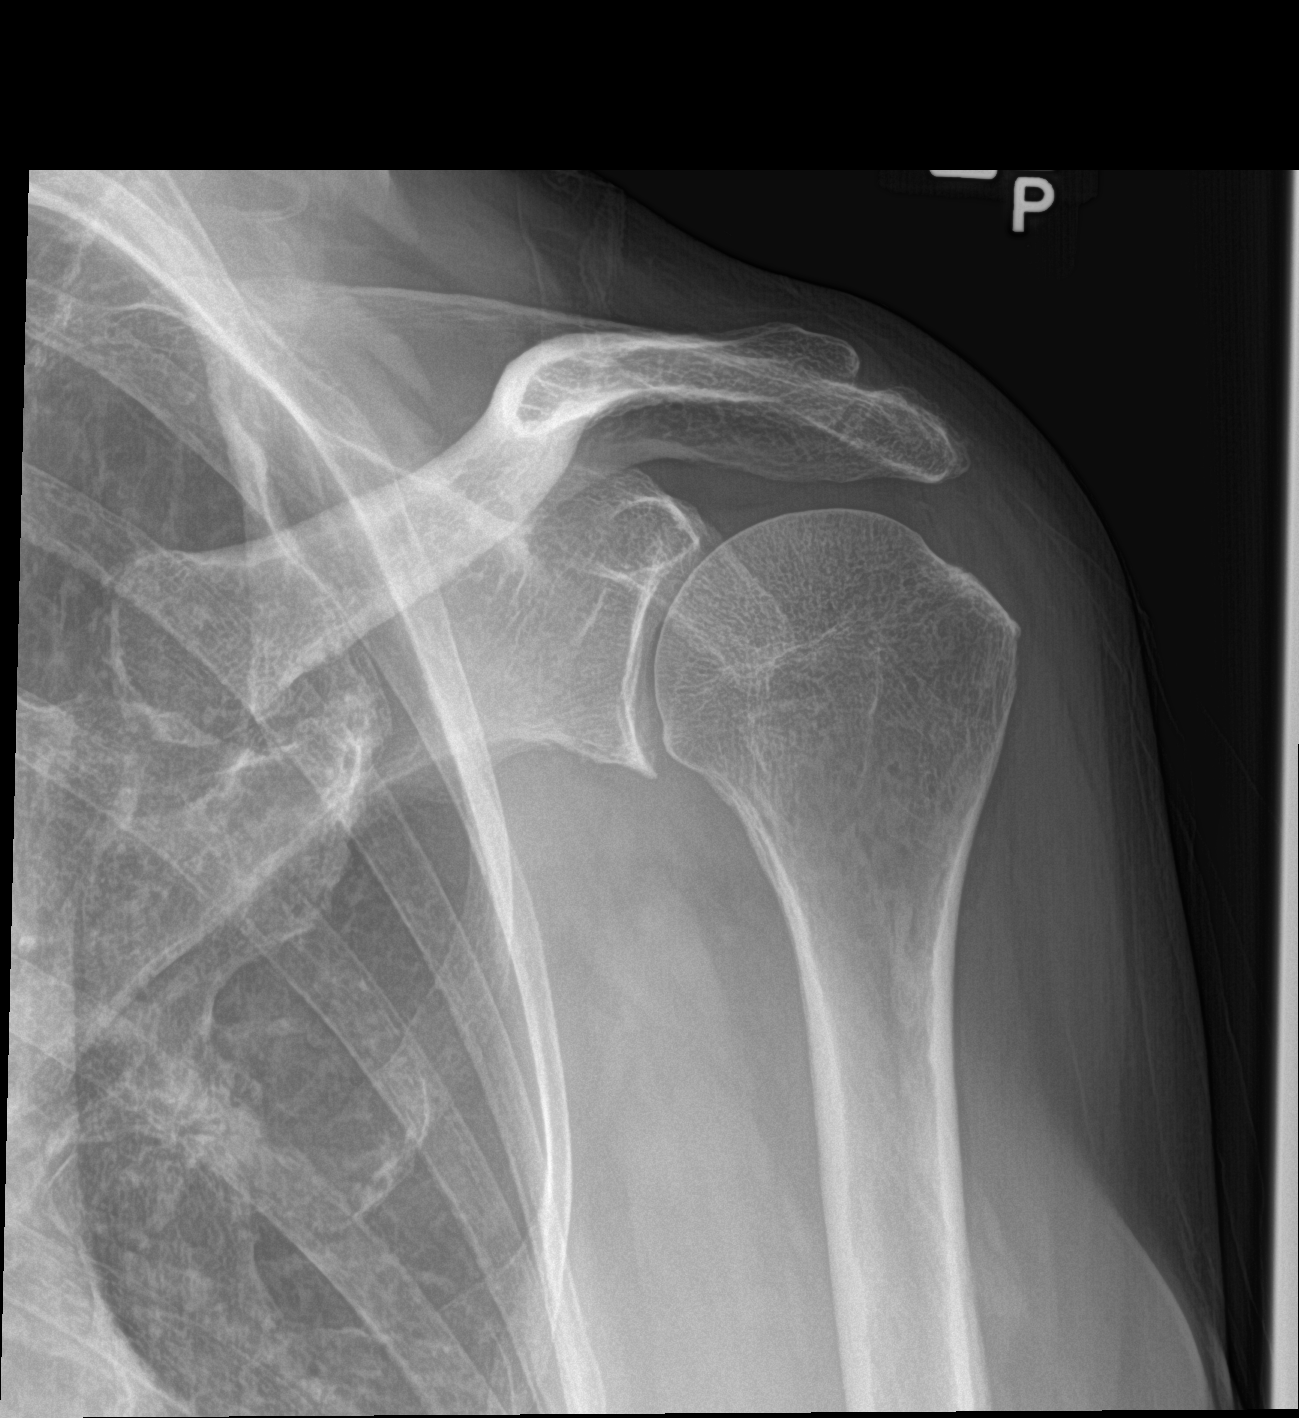

[shoulder y view]
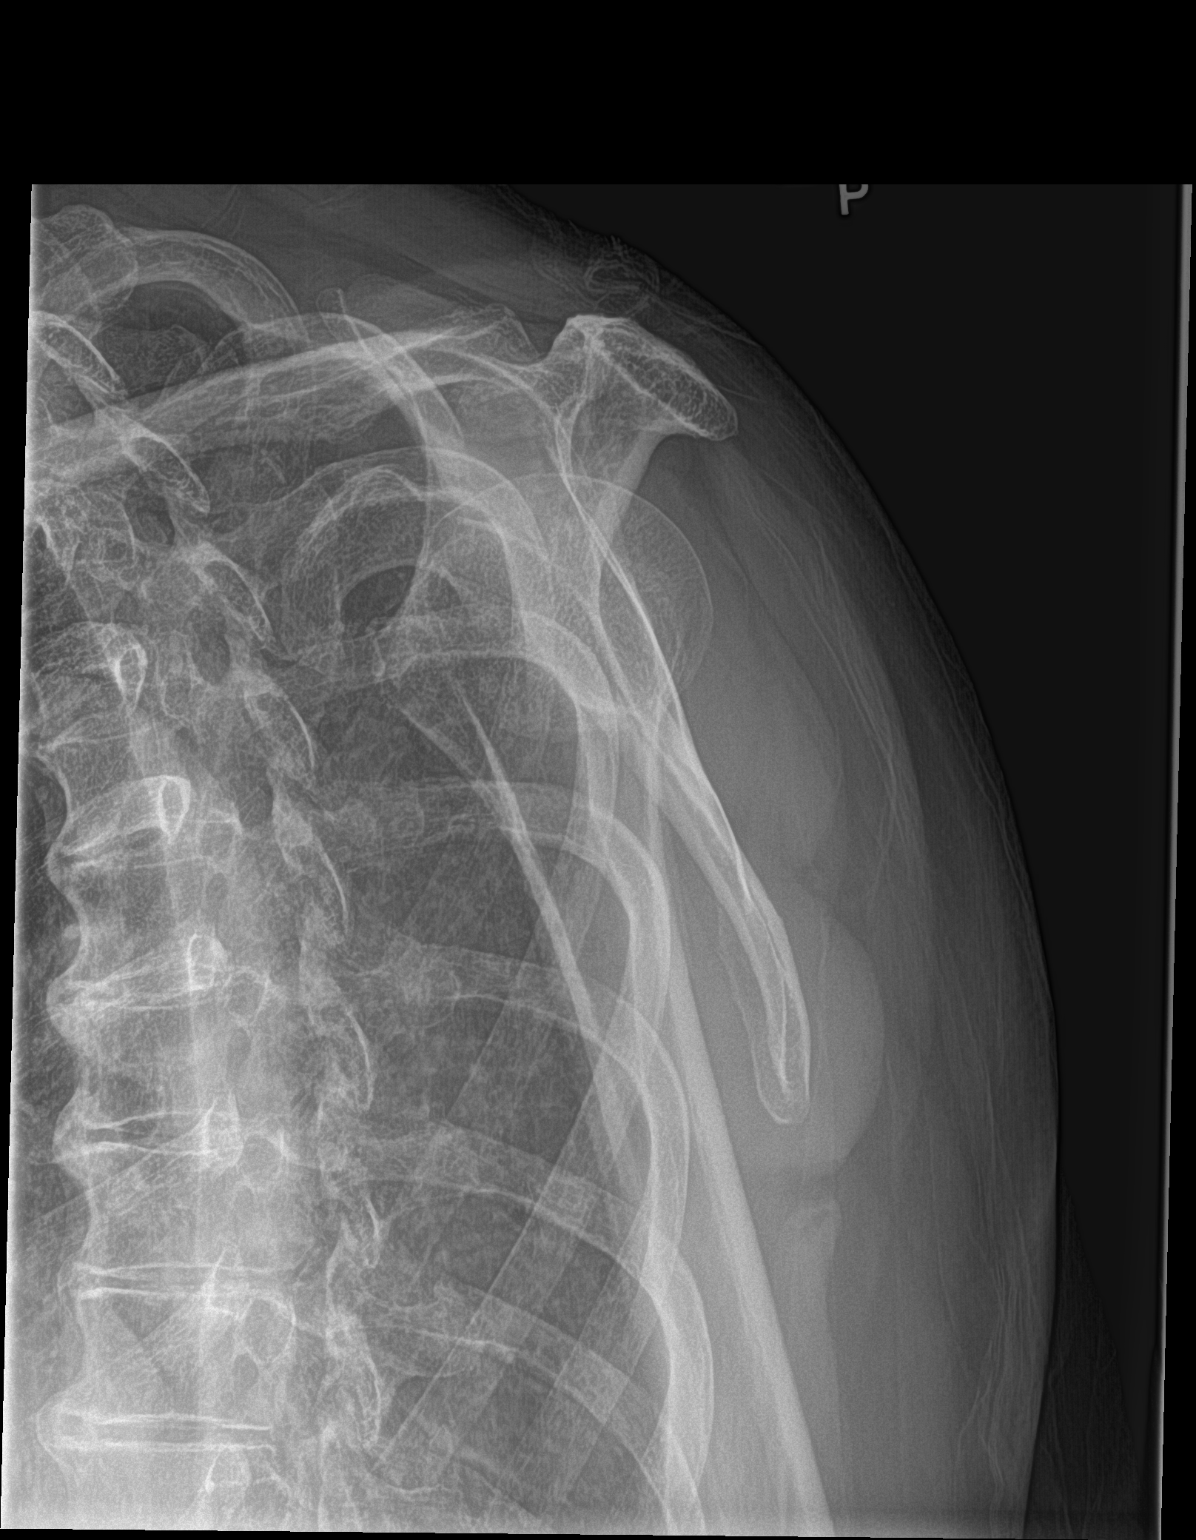

[shoulder axillary]
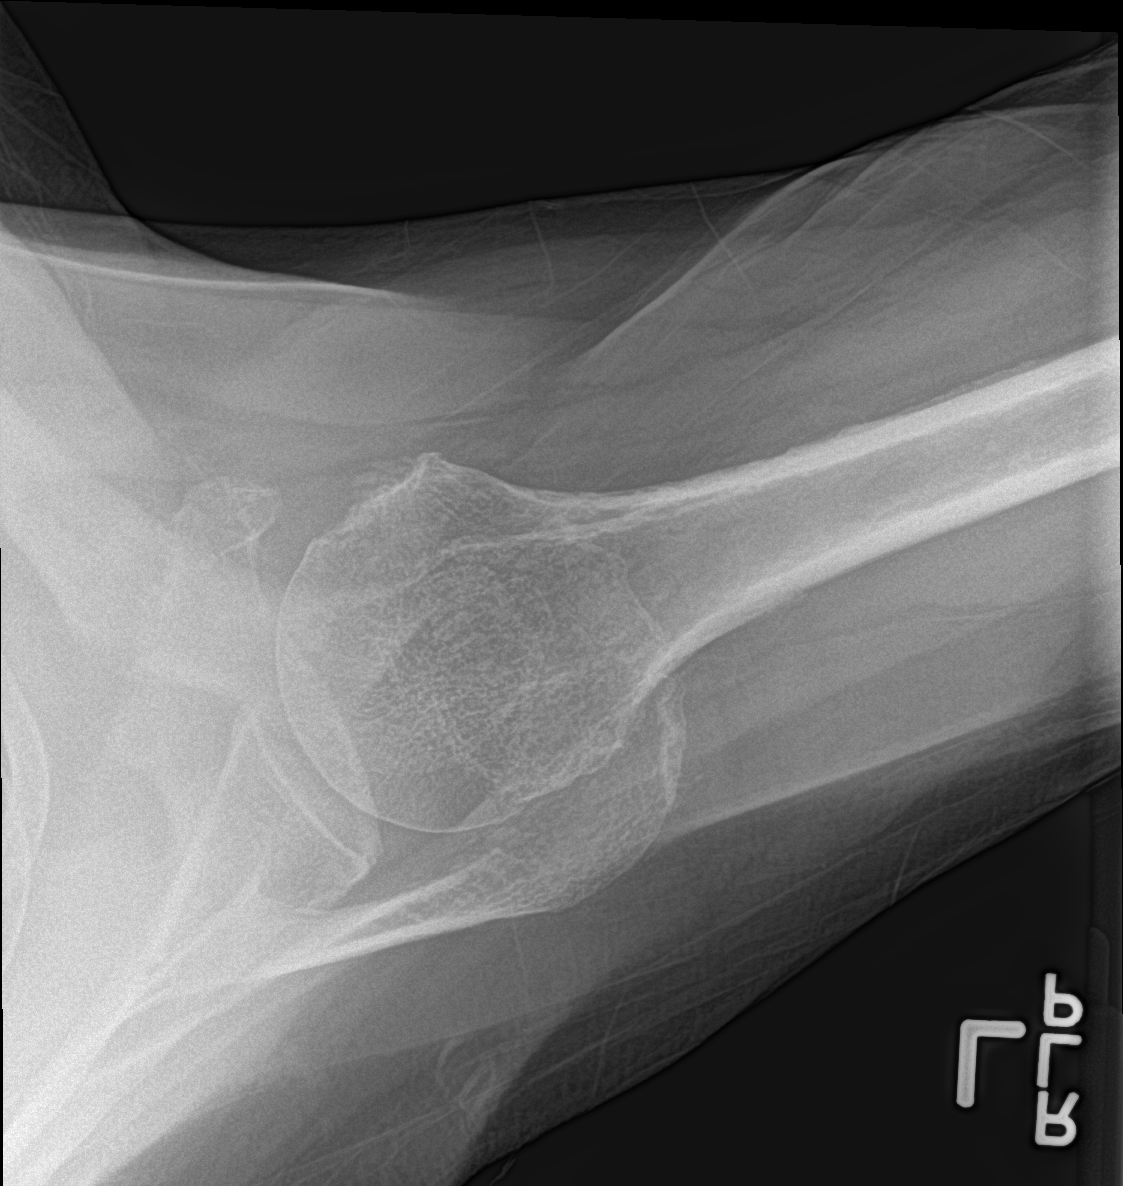

[3 of 3 positions shown; findings below may reference images not displayed]

FINDINGS: Mild glenohumeral osteoarthritis is present with osteophytes
extending off the inferior glenoid. The shoulder is located. There
is no fracture. No acute osseous abnormality. Visible LEFT chest
appears within normal limits.
IMPRESSION: Mild glenohumeral osteoarthritis.  No acute abnormality.

## 2016-10-05 ENCOUNTER — Telehealth: Payer: Self-pay | Admitting: Family Medicine

## 2016-10-05 NOTE — Telephone Encounter (Signed)
Yes

## 2016-10-05 NOTE — Telephone Encounter (Signed)
Pt wanted to know if she should come back in in 3 months to get labs done? Please advise?  Call pt @ [phone]. Thank you!

## 2016-10-05 NOTE — Telephone Encounter (Signed)
Is this okay to schedule?

## 2016-10-08 NOTE — Telephone Encounter (Signed)
Ok. I called pt back and left a vm to call the office to sch. Thank you!

## 2016-11-06 DIAGNOSIS — Z961 Presence of intraocular lens: Secondary | ICD-10-CM | POA: Diagnosis not present

## 2016-11-13 ENCOUNTER — Ambulatory Visit (INDEPENDENT_AMBULATORY_CARE_PROVIDER_SITE_OTHER): Payer: Medicare Other | Admitting: Obstetrics and Gynecology

## 2016-11-13 ENCOUNTER — Encounter: Payer: Self-pay | Admitting: Obstetrics and Gynecology

## 2016-11-13 VITALS — BP 137/80 | HR 81 | Ht 63.0 in | Wt 186.9 lb

## 2016-11-13 DIAGNOSIS — C541 Malignant neoplasm of endometrium: Secondary | ICD-10-CM

## 2016-11-13 NOTE — Patient Instructions (Signed)
1. Return in 6 months for follow-up appointment 2. Return to see Dr. Theora Gianotti of GYN oncology in 3 months as scheduled

## 2016-11-13 NOTE — Progress Notes (Signed)
Chief complaint: 1. Endometrioid adenocarcinoma grade 1 surveillance  GYN ENCOUNTER NOTE  Subjective:       Pamela Norman is a 75 y.o. G0P0000 female is here for gynecologic evaluation of the following issues:  1. Endometrioid adenocarcinoma surveillance, stage IA grade 1  Patient reports feeling well. Bowel and bladder function are normal. She is not experiencing any abdominal pelvic pain. She is not experiencing any vaginal bleeding or vaginal discharge. Appetite is normal.     Gynecologic History No LMP recorded. Patient has had a hysterectomy.    Surgical Procedure  CASE: [id number]  PATIENT: Pamela Norman  Surgical Pathology Report      SPECIMEN SUBMITTED:  A. Uterus, cervix, and bilateral tubes and ovaries   CLINICAL HISTORY:  None provided   PRE-OPERATIVE DIAGNOSIS:  grade 1 endometrioid endometrial cancer   POST-OPERATIVE DIAGNOSIS:  Same/ total laparoscopic hysterectomy, bilateral salpingo-oophorectomy      DIAGNOSIS:  A. UTERUS; HYSTERECTOMY:  - ENDOMETRIOID CARCINOMA WITH SQUAMOUS DIFFERENTIATION, GRADE 1,  INVADING LESS THAN ONE HALF OF MYOMETRIUM.  - CERVIX NOT INVOLVED.  - INTRAMURAL LEIOMYOMA.   RIGHT AND LEFT FALLOPIAN TUBES AND OVARIES; SALPINGO-OOPHORECTOMY:  - NOT INVOLVED.  - BENIGN SIMPLE CYST OF RIGHT OVARY, 3.2 CM.   Comment:  The cervix was received in two detached pieces. The corpus was received  intact.   ENDOMETRIUM: Hysterectomy, With or Without Other Organs or Tissues  Endometrium, Hysterectomy, With or Without Other Organs or Tissues  Cancer Case Summary  Specimen: Uterine corpus  SPECIMEN  Procedure  Simple hysterectomy  Additional Procedures:  Bilateral salpingo-oophorectomy  Lymph Node Sampling:   Not performed  Specimen Integrity: Intact hysterectomy specimen  TUMOR  Histologic Type:  Endometrioid adenocarcinoma, variant (specify)  with squamous differentiation  Histologic Grade:  FIGO grade 1  EXTENT   Tumor Size:  Greatest dimension (cm)  3.5cm  Myometrial Invasion:   Present  Depth of Myometrial Invasion: Specify depth of invasion (mm)  21mm  Myometrial Thickness (mm):  51mm  Involvement of Cervix:  Not involved  Other Organs Submitted: Right ovary  Not involved  Left ovary  Not involved  Right fallopian tube  Not involved  Left fallopian tube  Not involved  ACCESSORY FINDINGS  Lymph-Vascular Invasion: Present  STAGE (pTNM [FIGO])  Primary Tumor (pT):  pT1a [IA]: Tumor limited to endometrium or invades less than one-half  of the myometrium  Regional Lymph Nodes (pN)  pNX: Cannot be assessed  Pelvic Lymph Nodes: No pelvic nodes submitted or found  Para-aortic Lymph Nodes: No para-aortic nodes submitted or found  Distant Metastasis (pM): Not applicable            Cytology: DIAGNOSIS:  A. PELVIC WASHINGS:  - NEGATIVE FOR MALIGNANCY   Obstetric History OB History  Gravida Para Term Preterm AB Living  0 0 0 0 0 0  SAB TAB Ectopic Multiple Live Births  0 0 0 0        Obstetric Comments  Age at first menarche-age 63-13    Past Medical History:  Diagnosis Date  . Cataract   . Endometrial cancer ([place]) 2016   Grade 1,total laparoscopic hysterectomy  . History of colonoscopy 2014   within normal limits  . History of ovarian cyst 01/2015  . Hyperlipidemia   . Rectal prolapse   . Squamous cell carcinoma    face    Past Surgical History:  Procedure Laterality Date  . ABDOMINAL HYSTERECTOMY    . APPENDECTOMY    .  BILATERAL SALPINGOOPHORECTOMY  03/30/15  . CATARACT EXTRACTION W/PHACO Right 02/06/2016   Procedure: CATARACT EXTRACTION PHACO AND INTRAOCULAR LENS PLACEMENT (IOC);  Surgeon: Estill Cotta, MD;  Location: [facility] ORS;  Service: Ophthalmology;  Laterality: Right;  [place] 01:39 AP% 24.9 CDE 47.32 fluid pack lot # [id number]  . CATARACT EXTRACTION W/PHACO Left 03/05/2016   Procedure: CATARACT EXTRACTION PHACO AND INTRAOCULAR LENS PLACEMENT ([place]);   Surgeon: Estill Cotta, MD;  Location: [facility] ORS;  Service: Ophthalmology;  Laterality: Left;  [place] 01:02 AP% 23.2 CDE 26.73 fluid pack lot # [id number]  . COMBINED HYSTEROSCOPY DIAGNOSTIC / D&C  2016  . DILATION AND CURETTAGE OF UTERUS    . dnc    . LAPAROSCOPIC HYSTERECTOMY  03/30/15  . NASAL SINUS SURGERY    . SKIN CANCER EXCISION     UPPER LIP  . TONSILECTOMY, ADENOIDECTOMY, BILATERAL MYRINGOTOMY AND TUBES    . TONSILLECTOMY      Current Outpatient Prescriptions on File Prior to Visit  Medication Sig Dispense Refill  . atorvastatin (LIPITOR) 20 MG tablet Take 1 tablet (20 mg total) by mouth daily. 90 tablet 3  . risperiDONE (RISPERDAL) 1 MG tablet      No current facility-administered medications on file prior to visit.     No Known Allergies  Social History   Social History  . Marital status: Single    Spouse name: N/A  . Number of children: 0  . Years of education: PhD   Occupational History  . Retired    Social History Main Topics  . Smoking status: Former Smoker    Quit date: 04/13/1981  . Smokeless tobacco: Never Used  . Alcohol use Yes     Comment: Occasional wine.  . Drug use: No  . Sexual activity: Not Currently   Other Topics Concern  . Not on file   Social History Narrative   Lives in [place]. No children.      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      1-2 cups caffeine daily.    Family History  Problem Relation Age of Onset  . Colon cancer Father     older age onset  . COPD Father   . COPD Mother   . Breast cancer Neg Hx   . Ovarian cancer Neg Hx   . Diabetes Neg Hx   . Heart disease Neg Hx     The following portions of the patient's history were reviewed and updated as appropriate: allergies, current medications, past family history, past medical history, past social history, past surgical history and problem list.  Review of  Systems Review of Systems - General ROS: negative for - chills, fatigue, fever, hot flashes, malaise or night sweats Hematological and Lymphatic ROS: negative for - bleeding problems or swollen lymph nodes Gastrointestinal ROS: negative for - abdominal pain, blood in stools, change in bowel habits and nausea/vomiting Musculoskeletal ROS: negative for - joint pain, muscle pain or muscular weakness Genito-Urinary ROS: negative for - Vaginal bleeding, dyspareunia, dysuria, genital discharge, genital ulcers, hematuria, incontinence, nocturia or pelvic pain  Objective:   BP 137/80   Pulse 81   Ht 5\' 3"  (1.6 m)   Wt 186 lb 14.4 oz (84.8 kg)   BMI 33.11 kg/m  CONSTITUTIONAL: Well-developed, well-nourished female in no acute distress.  HENT:  Normocephalic, atraumatic.  NECK: Normal range of  motion, supple, no masses.  Normal thyroid.  SKIN: Skin is warm and dry. No rash noted. Not diaphoretic. No erythema. No pallor. [place]: Alert and oriented to person, place, and time. PSYCHIATRIC: Normal mood and affect. Normal behavior. Normal judgment and thought content. BACK: No CVA tenderness CARDIOVASCULAR:Not Examined RESPIRATORY: Not Examined BREASTS: Not Examined ABDOMEN: Soft, non distended; Non tender.  No Organomegaly. No hernias PELVIC:  External Genitalia: Normal  BUS: Normal  Vagina: Mild atrophic changes; good vault support; vaginal cuff notable for 4 x 7 mm area of pink granulation tissue, nonfriable, nontender (not biopsied)  Cervix: Surgically absent  Uterus: Surgically absent  Adnexa: Normal; nonpalpable and nontender  RV: External hemorrhoids; normal sphincter tone; no rectal masses  Bladder: Nontender MUSCULOSKELETAL: Normal range of motion. No tenderness.  No cyanosis, clubbing, or edema.     Assessment:   1. History of stage IA grade 1 endometrioid adenocarcinoma; less than 50% myometrial invasion; with lymph vascular invasion 2. Status post robotic hysterectomy,  bilateral salpingo-oophorectomy, pelvic sentinel lymph node mapping, and repair of vaginal lacerations caused by removal of uterus transvaginally. (03/30/2015) 3. Status post vaginal brachytherapy by Dr. Donella Stade 4. Small area 4 x 7 mm vaginal granulation, non-friable and vaginal cuff noted, not biopsied. Patient is to notify us if she develops vaginal spotting or discharge. If still persisting at next visit, consider biopsy. 5. No evidence of disease   Plan:   1. Continue with GYN oncology recommendation for pelvic exam follow-up:  Every 3 month pelvic exams 2 years, alternating between GYN oncology and [facility], then  Every 6 month pelvic exams 5 years  A total of 15 minutes were spent face-to-face with the patient during this encounter and over half of that time dealt with counseling and coordination of care.  Brayton Mars, MD  Note: This dictation was prepared with Dragon dictation along with smaller phrase technology. Any transcriptional errors that result from this process are unintentional.

## 2016-11-27 ENCOUNTER — Encounter: Payer: Self-pay | Admitting: Obstetrics and Gynecology

## 2016-12-17 DIAGNOSIS — F411 Generalized anxiety disorder: Secondary | ICD-10-CM | POA: Diagnosis not present

## 2016-12-17 DIAGNOSIS — F458 Other somatoform disorders: Secondary | ICD-10-CM | POA: Diagnosis not present

## 2016-12-17 DIAGNOSIS — F23 Brief psychotic disorder: Secondary | ICD-10-CM | POA: Diagnosis not present

## 2017-01-02 ENCOUNTER — Other Ambulatory Visit: Payer: Medicare Other

## 2017-03-06 ENCOUNTER — Inpatient Hospital Stay: Payer: Medicare Other | Attending: Obstetrics and Gynecology | Admitting: Obstetrics and Gynecology

## 2017-03-06 VITALS — BP 113/75 | HR 101 | Ht 66.0 in | Wt 191.6 lb

## 2017-03-06 DIAGNOSIS — Z90710 Acquired absence of both cervix and uterus: Secondary | ICD-10-CM | POA: Insufficient documentation

## 2017-03-06 DIAGNOSIS — K623 Rectal prolapse: Secondary | ICD-10-CM | POA: Diagnosis not present

## 2017-03-06 DIAGNOSIS — Z8542 Personal history of malignant neoplasm of other parts of uterus: Secondary | ICD-10-CM | POA: Insufficient documentation

## 2017-03-06 DIAGNOSIS — E785 Hyperlipidemia, unspecified: Secondary | ICD-10-CM | POA: Insufficient documentation

## 2017-03-06 DIAGNOSIS — M8580 Other specified disorders of bone density and structure, unspecified site: Secondary | ICD-10-CM | POA: Insufficient documentation

## 2017-03-06 DIAGNOSIS — Z90722 Acquired absence of ovaries, bilateral: Secondary | ICD-10-CM | POA: Diagnosis not present

## 2017-03-06 DIAGNOSIS — Z923 Personal history of irradiation: Secondary | ICD-10-CM | POA: Insufficient documentation

## 2017-03-06 DIAGNOSIS — Z79899 Other long term (current) drug therapy: Secondary | ICD-10-CM | POA: Insufficient documentation

## 2017-03-06 DIAGNOSIS — Z87891 Personal history of nicotine dependence: Secondary | ICD-10-CM | POA: Diagnosis not present

## 2017-03-06 DIAGNOSIS — C541 Malignant neoplasm of endometrium: Secondary | ICD-10-CM

## 2017-03-06 DIAGNOSIS — E559 Vitamin D deficiency, unspecified: Secondary | ICD-10-CM

## 2017-03-06 NOTE — Progress Notes (Signed)
Patient here for follow up. No complaints today. 

## 2017-03-06 NOTE — Progress Notes (Signed)
Gynecologic Oncology Interval Note  Referring Provider: Dr. Hassell Done Defrancesco  Chief Concern: Endometrial cancer surveillance  Subjective:  Pamela Norman is a 76 y.o. female with stage IA grade 1 endometrial cancer.    No complaints today. Returns for routine follow up. She was seen by Dr. Baruch Gouty on 05/21/2016 and she had a negative exam. He released her from clinic. Saw her Gyn 3 months ago too.  Dr. Lacinda Axon orders her mammograms which she just had on 08/2016.  HPI Please refer to prior notes for complete details. On 03/30/15 she underwent total laparoscopic hysterectomy, bilateral salpingo-oophorectomy, pelvic sentinel lymph node mapping, and repair of vaginal lacerations caused by removal of the uterus transvaginally.  Her final pathology as noted below.  Sentinel nodes were not identified and node dissection not done since frozen showed grade 1 tumor invading less than 50% (4/9 mm).  Since final path showed LVSI she was referred to Dr. Baruch Gouty for vaginal brachytherapy. She tolerated 6 HDR treatments well.      Surgical Procedure  CASE: [id number]  PATIENT: Pamela Norman  Surgical Pathology Report      SPECIMEN SUBMITTED:  A. Uterus, cervix, and bilateral tubes and ovaries   CLINICAL HISTORY:  None provided   PRE-OPERATIVE DIAGNOSIS:  grade 1 endometrioid endometrial cancer   POST-OPERATIVE DIAGNOSIS:  Same/ total laparoscopic hysterectomy, bilateral salpingo-oophorectomy      DIAGNOSIS:  A. UTERUS; HYSTERECTOMY:  - ENDOMETRIOID CARCINOMA WITH SQUAMOUS DIFFERENTIATION, GRADE 1,  INVADING LESS THAN ONE HALF OF MYOMETRIUM.  - CERVIX NOT INVOLVED.  - INTRAMURAL LEIOMYOMA.   RIGHT AND LEFT FALLOPIAN TUBES AND OVARIES; SALPINGO-OOPHORECTOMY:  - NOT INVOLVED.  - BENIGN SIMPLE CYST OF RIGHT OVARY, 3.2 CM.   Comment:  The cervix was received in two detached pieces. The corpus was received  intact.   ENDOMETRIUM: Hysterectomy, With or Without Other Organs or  Tissues  Endometrium, Hysterectomy, With or Without Other Organs or Tissues  Cancer Case Summary  Specimen: Uterine corpus  SPECIMEN  Procedure  Simple hysterectomy  Additional Procedures:  Bilateral salpingo-oophorectomy  Lymph Node Sampling:   Not performed  Specimen Integrity: Intact hysterectomy specimen  TUMOR  Histologic Type:  Endometrioid adenocarcinoma, variant (specify)  with squamous differentiation  Histologic Grade:  FIGO grade 1  EXTENT  Tumor Size:  Greatest dimension (cm)  3.5cm  Myometrial Invasion:   Present  Depth of Myometrial Invasion: Specify depth of invasion (mm)  13mm  Myometrial Thickness (mm):  62mm  Involvement of Cervix:  Not involved  Other Organs Submitted: Right ovary  Not involved  Left ovary  Not involved  Right fallopian tube  Not involved  Left fallopian tube  Not involved  ACCESSORY FINDINGS  Lymph-Vascular Invasion: Present  STAGE (pTNM [FIGO])  Primary Tumor (pT):  pT1a [IA]: Tumor limited to endometrium or invades less than one-half  of the myometrium  Regional Lymph Nodes (pN)  pNX: Cannot be assessed  Pelvic Lymph Nodes: No pelvic nodes submitted or found  Para-aortic Lymph Nodes: No para-aortic nodes submitted or found  Distant Metastasis (pM): Not applicable            Cytology: DIAGNOSIS:  A. PELVIC WASHINGS:  - NEGATIVE FOR MALIGNANCY.    Problem List: Patient Active Problem List   Diagnosis Date Noted  . Endometrial cancer ([place]) 11/13/2016  . Psychosis 09/17/2016  . Osteopenia 09/17/2016  . Vitamin D deficiency 09/17/2016  . History of endometrial cancer 05/04/2015  . Hyperlipidemia 05/21/2014  . Medicare annual wellness visit, subsequent  05/19/2013    Past Medical History: Past Medical History:  Diagnosis Date  . Cataract   . Endometrial cancer ([place]) 2016   Grade 1,total laparoscopic hysterectomy  . History of colonoscopy 2014   within normal limits  . History of ovarian cyst 01/2015   . Hyperlipidemia   . Rectal prolapse   . Squamous cell carcinoma    face    Past Surgical History: Past Surgical History:  Procedure Laterality Date  . ABDOMINAL HYSTERECTOMY    . APPENDECTOMY    . BILATERAL SALPINGOOPHORECTOMY  03/30/15  . CATARACT EXTRACTION W/PHACO Right 02/06/2016   Procedure: CATARACT EXTRACTION PHACO AND INTRAOCULAR LENS PLACEMENT (IOC);  Surgeon: Estill Cotta, MD;  Location: [facility] ORS;  Service: Ophthalmology;  Laterality: Right;  [place] 01:39 AP% 24.9 CDE 47.32 fluid pack lot # [id number]  . CATARACT EXTRACTION W/PHACO Left 03/05/2016   Procedure: CATARACT EXTRACTION PHACO AND INTRAOCULAR LENS PLACEMENT ([place]);  Surgeon: Estill Cotta, MD;  Location: [facility] OR[facility];  Service: Ophthalmology;  Laterality: Left;  [place] 01:02 AP% 23.2 CDE 26.73 fluid pack lot # [id number]  . COMBINED HYSTEROSCOPY DIAGNOSTIC / D&C  2016  . DILATION AND CURETTAGE OF UTERUS    . dnc    . LAPAROSCOPIC HYSTERECTOMY  03/30/15  . NASAL SINUS SURGERY    . SKIN CANCER EXCISION     UPPER LIP  . TONSILECTOMY, ADENOIDECTOMY, BILATERAL MYRINGOTOMY AND TUBES    . TONSILLECTOMY       OB History:  OB History  Gravida Para Term Preterm AB Living  0 0 0 0 0 0  SAB TAB Ectopic Multiple Live Births  0 0 0 0        Obstetric Comments  Age at first menarche-age [age]    Family History: Family History  Problem Relation Age of Onset  . Colon cancer Father     older age onset  . COPD Father   . COPD Mother   . Breast cancer Neg Hx   . Ovarian cancer Neg Hx   . Diabetes Neg Hx   . Heart disease Neg Hx     Social History: Social History   Social History  . Marital status: Single    Spouse name: N/A  . Number of children: 0  . Years of education: PhD   Occupational History  . Retired    Social History Main Topics  . Smoking status: Former Smoker    Quit date: 04/13/1981  . Smokeless tobacco: Never Used  . Alcohol use Yes     Comment: Occasional wine.  . Drug use: No  . Sexual  activity: Not Currently   Other Topics Concern  . Not on file   Social History Narrative   Lives in [place]. No children.      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      1-2 cups caffeine daily.    Allergies: No Known Allergies  Current Medications: Current Outpatient Prescriptions  Medication Sig Dispense Refill  . atorvastatin (LIPITOR) 20 MG tablet Take 1 tablet (20 mg total) by mouth daily. 90 tablet 3  . cholecalciferol (VITAMIN D) 1000 units tablet Take 1,000 Units by mouth daily.    . DULoxetine (CYMBALTA) 30 MG capsule     . risperiDONE (RISPERDAL) 1 MG tablet      No current facility-administered medications for this visit.  Review of Systems General: no complaints  HEENT: no complaints  Lungs: no complaints  Cardiac: no complaints  GI: no complaints  GU: no complaints  Musculoskeletal: no complaints  Extremities: no complaints  Skin: no complaints  Neuro: no complaints  Endocrine: no complaints  Psych: no complaints       Objective:   Vitals:   03/06/17 1400  BP: 113/75  Pulse: (!) 101  Weight: 191 lb 9.6 oz (86.9 kg)  Height: 5\' 6"  (1.676 m)  Body mass index is 30.93 kg/m.  ECOG Performance Status: 0 - Asymptomatic  General appearance: alert, cooperative and appears stated age 60: ATNC Neck: normal Lungs: B CTA Heart: Normal rate and rhythm Abdomen: no palpable masses, no hernias, well healed incisions, soft, nontender, nondistended. Umbilicus normal.  Back: normal to inspection Extremities: no lower extremity edema Neurological exam reveals alert, oriented, normal speech, no focal findings or movement disorder noted  Pelvic: EGBUS: normal, Vagina: well healed with some radiation changes, no lesions, Bimanual: no masses or nodularity.  RV: confirms     Assessment:  Pamela Norman is a 76 y.o. female diagnosed with stage IA,  grade 1, endometrioid adenocarcinoma of the endometrium S/P TLH/BSO in 4/16 and vaginal brachytherapy due to 4/9 mm invasion and LVSI. NED.  Body mass index is 30.93 kg/m.  Plan:   Problem List Items Addressed This Visit      Genitourinary   Endometrial cancer [facility]) - Primary     Patient has been alternating visits with [place] and Dr, DeFrancesco q 3 months.  Since it has been 2 years from surgery and the risk of recurrence is very low, she would like to come less often and I think this is reasonable.  We will see her again in 6 months.  She will return sooner if there are any concerning symptoms such as pain, discharge, bleeding, GU/GI issues.   Patient education for obesity, lifestyle, exercise, nutrition, sexual health, vaginal lubricants. We discussed her weight and need for weight loss, stressed good nutrition, and exercise. She is not a smoker. I provided information regarding calorie counting and exercise for weight loss. I offered referral to the CARE program and provided a pamphlet today.   Mellody Drown, MD  CC:  Referring Provider: Dr. Hassell Done Defrancesco

## 2017-03-18 DIAGNOSIS — F411 Generalized anxiety disorder: Secondary | ICD-10-CM | POA: Diagnosis not present

## 2017-03-18 DIAGNOSIS — F23 Brief psychotic disorder: Secondary | ICD-10-CM | POA: Diagnosis not present

## 2017-05-14 ENCOUNTER — Encounter: Payer: Self-pay | Admitting: Obstetrics and Gynecology

## 2017-06-18 DIAGNOSIS — F411 Generalized anxiety disorder: Secondary | ICD-10-CM | POA: Diagnosis not present

## 2017-08-13 ENCOUNTER — Ambulatory Visit: Payer: Medicare Other

## 2017-08-15 ENCOUNTER — Ambulatory Visit (INDEPENDENT_AMBULATORY_CARE_PROVIDER_SITE_OTHER): Payer: Medicare Other

## 2017-08-15 VITALS — BP 118/64 | HR 89 | Temp 98.4°F | Resp 14 | Ht 66.0 in | Wt 190.8 lb

## 2017-08-15 DIAGNOSIS — Z1331 Encounter for screening for depression: Secondary | ICD-10-CM

## 2017-08-15 DIAGNOSIS — Z0000 Encounter for general adult medical examination without abnormal findings: Secondary | ICD-10-CM | POA: Diagnosis not present

## 2017-08-15 DIAGNOSIS — Z1389 Encounter for screening for other disorder: Secondary | ICD-10-CM | POA: Diagnosis not present

## 2017-08-15 NOTE — Patient Instructions (Addendum)
  Ms. Nouri , Thank you for taking time to come for your Medicare Wellness Visit. I appreciate your ongoing commitment to your health goals. Please review the following plan we discussed and let me know if I can assist you in the future.  Follow up with Dr. Caryl Bis as needed.   These are the goals we discussed: Goals    . Increase physical activity          Continue exercise regimen and begin Tai-Chi    . Increase water intake    . Low carb foods          Low carb foods Fat free icecream       This is a list of the screening recommended for you and due dates:  Health Maintenance  Topic Date Due  . Tetanus Vaccine  01/30/1961  . DEXA scan (bone density measurement)  01/30/2007  . Flu Shot  07/10/2017  . Colon Cancer Screening  07/17/2023  . Pneumonia vaccines  Completed

## 2017-08-15 NOTE — Progress Notes (Signed)
Subjective:   Pamela Norman is a 76 y.o. female who presents for Medicare Annual (Subsequent) preventive examination.  Review of Systems:  No ROS.  Medicare Wellness Visit. Additional risk factors are reflected in the social history.  Cardiac Risk Factors include: advanced age (>69mn, >>40women)     Objective:     Vitals: BP 118/64 (BP Location: Left Arm, Patient Position: Sitting, Cuff Size: Normal)   Pulse 89   Temp 98.4 F (36.9 C) (Oral)   Resp 14   Ht 5' 6"  (1.676 m)   Wt 190 lb 12.8 oz (86.5 kg)   SpO2 96%   BMI 30.80 kg/m   Body mass index is 30.8 kg/m.   Tobacco History  Smoking Status  . Former Smoker  . Quit date: 04/13/1981  Smokeless Tobacco  . Never Used     Counseling given: Not Answered   Past Medical History:  Diagnosis Date  . Cataract   . Endometrial cancer ([place] 2016   Grade 1,total laparoscopic hysterectomy  . History of colonoscopy 2014   within normal limits  . History of ovarian cyst 01/2015  . Hyperlipidemia   . Rectal prolapse   . Squamous cell carcinoma    face   Past Surgical History:  Procedure Laterality Date  . ABDOMINAL HYSTERECTOMY    . APPENDECTOMY    . BILATERAL SALPINGOOPHORECTOMY  03/30/15  . CATARACT EXTRACTION W/PHACO Right 02/06/2016   Procedure: CATARACT EXTRACTION PHACO AND INTRAOCULAR LENS PLACEMENT (IOC);  Surgeon: SEstill Cotta MD;  Location: [facility] ORS;  Service: Ophthalmology;  Laterality: Right;  [place]39 AP% 24.9 CDE 47.32 fluid pack lot # [id number]  . CATARACT EXTRACTION W/PHACO Left 03/05/2016   Procedure: CATARACT EXTRACTION PHACO AND INTRAOCULAR LENS PLACEMENT ([place];  Surgeon: SEstill Cotta MD;  Location: [facility] ORS;  Service: Ophthalmology;  Laterality: Left;  [place]:02 AP% 23.2 CDE 26.73 fluid pack lot # [id number]  . COMBINED HYSTEROSCOPY DIAGNOSTIC / D&C  2016  . DILATION AND CURETTAGE OF UTERUS    . dnc    . LAPAROSCOPIC HYSTERECTOMY  03/30/15  . NASAL SINUS SURGERY    . SKIN CANCER  EXCISION     UPPER LIP  . TONSILECTOMY, ADENOIDECTOMY, BILATERAL MYRINGOTOMY AND TUBES    . TONSILLECTOMY     Family History  Problem Relation Age of Onset  . Colon cancer Father        older age onset  . COPD Father   . COPD Mother   . Breast cancer Neg Hx   . Ovarian cancer Neg Hx   . Diabetes Neg Hx   . Heart disease Neg Hx    History  Sexual Activity  . Sexual activity: Not Currently    Outpatient Encounter Prescriptions as of 08/15/2017  Medication Sig  . atorvastatin (LIPITOR) 20 MG tablet Take 1 tablet (20 mg total) by mouth daily.  . cholecalciferol (VITAMIN D) 1000 units tablet Take 1,000 Units by mouth daily.  . DULoxetine (CYMBALTA) 30 MG capsule   . risperiDONE (RISPERDAL) 1 MG tablet    No facility-administered encounter medications on file as of 08/15/2017.     Activities of Daily Living In your present state of health, do you have any difficulty performing the following activities: 08/15/2017  Hearing? N  Vision? N  Difficulty concentrating or making decisions? N  Walking or climbing stairs? N  Dressing or bathing? N  Doing errands, shopping? N  Preparing Food and eating ? N  Using the Toilet? N  In the past six months, have you accidently leaked urine? N  Do you have problems with loss of bowel control? N  Managing your Medications? N  Managing your Finances? N  Housekeeping or managing your Housekeeping? N  Some recent data might be hidden    Patient Care Team: Coral Spikes, DO as PCP - General (Family Medicine)    Assessment:    This is a routine wellness examination for Pamela Norman. The goal of the wellness visit is to assist the patient how to close the gaps in care and create a preventative care plan for the patient.   The roster of all physicians providing medical care to patient is listed in the Snapshot section of the chart.  Taking calcium VIT D as appropriate/Osteoporosis risk reviewed.    Safety issues reviewed; Lives alone. Smoke and  carbon monoxide detectors in the home. No firearms in the home.  Wears seatbelts when driving or riding with others. Patient does wear sunscreen or protective clothing when in direct sunlight. No violence in the home.  Depression- PHQ 2 &9 complete.  No signs/symptoms or verbal communication regarding little pleasure in doing things, feeling down, depressed or hopeless. No changes in sleeping, energy, eating, concentrating.  No thoughts of self harm or harm towards others.  Time spent on this topic is 8 minutes.   Patient is alert, normal appearance, oriented to person/place/and time.  Correctly identified the president of the [place], recall of 3/3 words, and performing simple calculations. Displays appropriate judgement and can read correct time from watch face.   No new identified risk were noted.  No failures at ADL's or IADL's.    BMI- discussed the importance of a healthy diet, water intake and the benefits of aerobic exercise. Educational material provided.   24 hour diet recall: Breakfast: Cereal Lunch: Cheeseburger, french fries Dinner: Healthy choice pre- packaged meal Snack: Icecream Daily fluid intake: 2 cups of caffeine, 1 cups of water  Encouraged increase of water intake and low carb foods.  Educational material provided.  Eye- Visual acuity not assessed per patient preference since they have regular follow up with the ophthalmologist.  Cataracts extracted, wears reading glasses.  Sleep patterns- Sleeps 8 hours at night.  Wakes feeling rested.  Influenza and TDAP vaccine deferred per patient preference.  Educational material provided.  Dexa Scan declined.  Patient Concerns: None at this time. Follow up with PCP as needed.  Exercise Activities and Dietary recommendations Current Exercise Habits: Structured exercise class, Type of exercise: walking;calisthenics (Tone Up Class Aerobics), Time (Minutes): 60, Frequency (Times/Week): 2, Weekly Exercise (Minutes/Week): 120,  Intensity: Mild  Goals    . Increase physical activity          Continue exercise regimen and begin Tai-Chi    . Increase water intake    . Low carb foods          Low carb foods Fat free icecream      Fall Risk Fall Risk  08/15/2017 08/09/2016 05/11/2015  Falls in the past year? No Yes No  Number falls in past yr: - 1 -  Injury with Fall? - Yes -  Comment - Stable and followed by PCP -  Follow up - Falls prevention discussed;Education provided -   Depression Screen PHQ 2/9 Scores 08/15/2017 08/09/2016 05/11/2015  PHQ - 2 Score 0 0 2  PHQ- 9 Score 0 - 3     Cognitive Function MMSE - Mini Mental State Exam 08/15/2017 08/09/2016  Orientation to  time 5 5  Orientation to Place 5 5  Registration 3 3  Attention/ Calculation 5 5  Recall 3 3  Language- name 2 objects 2 2  Language- repeat 1 1  Language- follow 3 step command 3 3  Language- read & follow direction 1 1  Write a sentence 1 1  Copy design 1 1  Total score 30 30        Immunization History  Administered Date(s) Administered  . Influenza-Unspecified 01/24/2016, 08/16/2016  . Pneumococcal Conjugate-13 05/21/2014  . Pneumococcal Polysaccharide-23 08/09/2016  . Zoster Recombinat (Shingrix) 03/22/2017   Screening Tests Health Maintenance  Topic Date Due  . TETANUS/TDAP  01/30/1961  . DEXA SCAN  01/30/2007  . INFLUENZA VACCINE  07/10/2017  . COLONOSCOPY  07/17/2023  . PNA vac Low Risk Adult  Completed      Plan:    End of life planning; Advanced aging; Advanced directives discussed.  No HCPOA/Living Will.  Additional information declined at this time.  I have personally reviewed and noted the following in the patient's chart:   . Medical and social history . Use of alcohol, tobacco or illicit drugs  . Current medications and supplements . Functional ability and status . Nutritional status . Physical activity . Advanced directives . List of other physicians . Hospitalizations, surgeries, and ER visits in  previous 12 months . Vitals . Screenings to include cognitive, depression, and falls . Referrals and appointments  In addition, I have reviewed and discussed with patient certain preventive protocols, quality metrics, and best practice recommendations. A written personalized care plan for preventive services as well as general preventive health recommendations were provided to patient.     Varney Biles, LPN  01/12/4143

## 2017-09-03 NOTE — Progress Notes (Deleted)
Gynecologic Oncology Interval Note  Referring Provider: Dr. Hassell Done Defrancesco  Chief Concern: Endometrial cancer surveillance  Subjective:  Pamela Norman is a 76 y.o. female with stage IA grade 1 endometrial cancer.    She was last seen by Dr. Fransisca Connors on 03/06/2017 and had a negative exam.  She saw Dr. Enzo Bi on 11/13/2016 and due to see him on ***  Dr. Lacinda Axon orders her mammograms which she just had on 08/2016.  HPI Please refer to prior notes for complete details. On 03/30/15 she underwent total laparoscopic hysterectomy, bilateral salpingo-oophorectomy, pelvic sentinel lymph node mapping, and repair of vaginal lacerations caused by removal of the uterus transvaginally.  Her final pathology as noted below.  Sentinel nodes were not identified and node dissection not done since frozen showed grade 1 tumor invading less than 50% (4/9 mm).  Since final path showed LVSI she was referred to Dr. Baruch Gouty for vaginal brachytherapy. She tolerated 6 HDR treatments well.      Surgical Procedure  CASE: [id number]  PATIENT: Pamela Norman  Surgical Pathology Report      SPECIMEN SUBMITTED:  A. Uterus, cervix, and bilateral tubes and ovaries   CLINICAL HISTORY:  None provided   PRE-OPERATIVE DIAGNOSIS:  grade 1 endometrioid endometrial cancer   POST-OPERATIVE DIAGNOSIS:  Same/ total laparoscopic hysterectomy, bilateral salpingo-oophorectomy      DIAGNOSIS:  A. UTERUS; HYSTERECTOMY:  - ENDOMETRIOID CARCINOMA WITH SQUAMOUS DIFFERENTIATION, GRADE 1,  INVADING LESS THAN ONE HALF OF MYOMETRIUM.  - CERVIX NOT INVOLVED.  - INTRAMURAL LEIOMYOMA.   RIGHT AND LEFT FALLOPIAN TUBES AND OVARIES; SALPINGO-OOPHORECTOMY:  - NOT INVOLVED.  - BENIGN SIMPLE CYST OF RIGHT OVARY, 3.2 CM.   Comment:  The cervix was received in two detached pieces. The corpus was received  intact.   ENDOMETRIUM: Hysterectomy, With or Without Other Organs or Tissues  Endometrium, Hysterectomy, With or  Without Other Organs or Tissues  Cancer Case Summary  Specimen: Uterine corpus  SPECIMEN  Procedure  Simple hysterectomy  Additional Procedures:  Bilateral salpingo-oophorectomy  Lymph Node Sampling:   Not performed  Specimen Integrity: Intact hysterectomy specimen  TUMOR  Histologic Type:  Endometrioid adenocarcinoma, variant (specify)  with squamous differentiation  Histologic Grade:  FIGO grade 1  EXTENT  Tumor Size:  Greatest dimension (cm)  3.5cm  Myometrial Invasion:   Present  Depth of Myometrial Invasion: Specify depth of invasion (mm)  64mm  Myometrial Thickness (mm):  44mm  Involvement of Cervix:  Not involved  Other Organs Submitted: Right ovary  Not involved  Left ovary  Not involved  Right fallopian tube  Not involved  Left fallopian tube  Not involved  ACCESSORY FINDINGS  Lymph-Vascular Invasion: Present  STAGE (pTNM [FIGO])  Primary Tumor (pT):  pT1a [IA]: Tumor limited to endometrium or invades less than one-half  of the myometrium  Regional Lymph Nodes (pN)  pNX: Cannot be assessed  Pelvic Lymph Nodes: No pelvic nodes submitted or found  Para-aortic Lymph Nodes: No para-aortic nodes submitted or found  Distant Metastasis (pM): Not applicable            Cytology: DIAGNOSIS:  A. PELVIC WASHINGS:  - NEGATIVE FOR MALIGNANCY.   Clinically NED since.   Problem List: Patient Active Problem List   Diagnosis Date Noted  . Endometrial cancer ([place]) 11/13/2016  . Psychosis 09/17/2016  . Osteopenia 09/17/2016  . Vitamin D deficiency 09/17/2016  . History of endometrial cancer 05/04/2015  . Hyperlipidemia 05/21/2014  . Medicare annual wellness visit, subsequent 05/19/2013  Past Medical History: Past Medical History:  Diagnosis Date  . Cataract   . Endometrial cancer ([place]) 2016   Grade 1,total laparoscopic hysterectomy  . History of colonoscopy 2014   within normal limits  . History of ovarian cyst 01/2015  . Hyperlipidemia   .  Rectal prolapse   . Squamous cell carcinoma    face    Past Surgical History: Past Surgical History:  Procedure Laterality Date  . ABDOMINAL HYSTERECTOMY    . APPENDECTOMY    . BILATERAL SALPINGOOPHORECTOMY  03/30/15  . CATARACT EXTRACTION W/PHACO Right 02/06/2016   Procedure: CATARACT EXTRACTION PHACO AND INTRAOCULAR LENS PLACEMENT (IOC);  Surgeon: Estill Cotta, MD;  Location: [facility] ORS;  Service: Ophthalmology;  Laterality: Right;  [place] 01:39 AP% 24.9 CDE 47.32 fluid pack lot # [id number]  . CATARACT EXTRACTION W/PHACO Left 03/05/2016   Procedure: CATARACT EXTRACTION PHACO AND INTRAOCULAR LENS PLACEMENT ([place]);  Surgeon: Estill Cotta, MD;  Location: [facility] ORS;  Service: Ophthalmology;  Laterality: Left;  [place] 01:02 AP% 23.2 CDE 26.73 fluid pack lot # [id number]  . COMBINED HYSTEROSCOPY DIAGNOSTIC / D&C  2016  . DILATION AND CURETTAGE OF UTERUS    . dnc    . LAPAROSCOPIC HYSTERECTOMY  03/30/15  . NASAL SINUS SURGERY    . SKIN CANCER EXCISION     UPPER LIP  . TONSILECTOMY, ADENOIDECTOMY, BILATERAL MYRINGOTOMY AND TUBES    . TONSILLECTOMY       OB History:  OB History  Gravida Para Term Preterm AB Living  0 0 0 0 0 0  SAB TAB Ectopic Multiple Live Births  0 0 0 0        Obstetric Comments  Age at first menarche-age [age]    Family History: Family History  Problem Relation Age of Onset  . Colon cancer Father        older age onset  . COPD Father   . COPD Mother   . Breast cancer Neg Hx   . Ovarian cancer Neg Hx   . Diabetes Neg Hx   . Heart disease Neg Hx     Social History: Social History   Social History  . Marital status: Single    Spouse name: N/A  . Number of children: 0  . Years of education: PhD   Occupational History  . Retired    Social History Main Topics  . Smoking status: Former Smoker    Quit date: 04/13/1981  . Smokeless tobacco: Never Used  . Alcohol use No  . Drug use: No  . Sexual activity: Not Currently   Other Topics Concern  .  Not on file   Social History Narrative   Lives in [place]. No children.      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      1-2 cups caffeine daily.    Allergies: No Known Allergies  Current Medications: Current Outpatient Prescriptions  Medication Sig Dispense Refill  . atorvastatin (LIPITOR) 20 MG tablet Take 1 tablet (20 mg total) by mouth daily. 90 tablet 3  . cholecalciferol (VITAMIN D) 1000 units tablet Take 1,000 Units by mouth daily.    . DULoxetine (CYMBALTA) 30 MG capsule     . risperiDONE (RISPERDAL) 1 MG tablet      No current facility-administered medications for this visit.     ***Review of Systems General:  no complaints  HEENT: no complaints  Lungs: no complaints  Cardiac: no complaints  GI: no complaints  GU: no complaints  Musculoskeletal: no complaints  Extremities: no complaints  Skin: no complaints  Neuro: no complaints  Endocrine: no complaints  Psych: no complaints       Objective:   There were no vitals filed for this visit.There is no height or weight on file to calculate BMI.  ECOG Performance Status: 0 - Asymptomatic  General appearance: alert, cooperative and appears stated age 89: ATNC Neck: normal Lungs: B CTA Heart: Normal rate and rhythm Abdomen: no palpable masses, no hernias, well healed incisions, soft, nontender, nondistended. Umbilicus normal.  Back: normal to inspection Extremities: no lower extremity edema Neurological exam reveals alert, oriented, normal speech, no focal findings or movement disorder noted  Pelvic: EGBUS: normal, Vagina: well healed with some radiation changes, no lesions, Bimanual: no masses or nodularity.  RV: confirms     Assessment:  Pamela Norman is a 76 y.o. female diagnosed with stage IA, grade 1, endometrioid adenocarcinoma of the endometrium S/P TLH/BSO in 4/16 and vaginal brachytherapy  due to 4/9 mm invasion and LVSI. NED.  There is no height or weight on file to calculate BMI.  Plan:   Problem List Items Addressed This Visit    None     Patient has been alternating visits with [place] and Dr, DeFrancesco q 3 months.***  Since it has been 2 years from surgery and the risk of recurrence is very low, she would like to come less often and I think this is reasonable.  We will see her again in 6 months.  She will return sooner if there are any concerning symptoms such as pain, discharge, bleeding, GU/GI issues.   Patient education for obesity, lifestyle, exercise, nutrition, sexual health, vaginal lubricants. We discussed her weight and need for weight loss, stressed good nutrition, and exercise. She is not a smoker. I provided information regarding calorie counting and exercise for weight loss. I offered referral to the CARE program and provided a pamphlet today.   Gillis Ends, MD  CC:  Referring Provider: Dr. Hassell Done Defrancesco

## 2017-09-04 ENCOUNTER — Inpatient Hospital Stay: Payer: Medicare Other

## 2017-09-05 ENCOUNTER — Other Ambulatory Visit: Payer: Self-pay | Admitting: Family Medicine

## 2017-09-11 ENCOUNTER — Inpatient Hospital Stay: Payer: Medicare Other | Attending: Obstetrics and Gynecology | Admitting: Obstetrics and Gynecology

## 2017-09-11 VITALS — BP 134/79 | HR 90 | Temp 97.4°F | Resp 18 | Wt 192.8 lb

## 2017-09-11 DIAGNOSIS — Z8542 Personal history of malignant neoplasm of other parts of uterus: Secondary | ICD-10-CM | POA: Diagnosis not present

## 2017-09-11 DIAGNOSIS — E785 Hyperlipidemia, unspecified: Secondary | ICD-10-CM | POA: Diagnosis not present

## 2017-09-11 DIAGNOSIS — Z90710 Acquired absence of both cervix and uterus: Secondary | ICD-10-CM | POA: Insufficient documentation

## 2017-09-11 DIAGNOSIS — Z923 Personal history of irradiation: Secondary | ICD-10-CM | POA: Diagnosis not present

## 2017-09-11 DIAGNOSIS — Z79899 Other long term (current) drug therapy: Secondary | ICD-10-CM | POA: Insufficient documentation

## 2017-09-11 DIAGNOSIS — M8580 Other specified disorders of bone density and structure, unspecified site: Secondary | ICD-10-CM | POA: Insufficient documentation

## 2017-09-11 DIAGNOSIS — Z6831 Body mass index (BMI) 31.0-31.9, adult: Secondary | ICD-10-CM | POA: Diagnosis not present

## 2017-09-11 DIAGNOSIS — Z90722 Acquired absence of ovaries, bilateral: Secondary | ICD-10-CM | POA: Diagnosis not present

## 2017-09-11 DIAGNOSIS — Z87891 Personal history of nicotine dependence: Secondary | ICD-10-CM | POA: Diagnosis not present

## 2017-09-11 DIAGNOSIS — C541 Malignant neoplasm of endometrium: Secondary | ICD-10-CM

## 2017-09-11 NOTE — Progress Notes (Signed)
  Oncology Nurse Navigator Documentation Chaperoned pelvic exam. Follow up in 6 months. Navigator Location: [facility] (09/11/17 1500)   )Navigator Encounter Type: Follow-up Appt (09/11/17 1500)                     Patient Visit Type: GynOnc (09/11/17 1500)                              Time Spent with Patient: 15 (09/11/17 1500)

## 2017-09-11 NOTE — Progress Notes (Signed)
Here for follow up

## 2017-09-11 NOTE — Progress Notes (Signed)
Gynecologic Oncology Interval Note  Referring Provider: Dr. Hassell Done Defrancesco  Chief Concern: Endometrial cancer surveillance  Subjective:  Pamela Norman is a 76 y.o. female with stage IA grade 1 endometrial cancer.    No complaints today. Returns for routine follow up.    HPI Please refer to prior notes for complete details. On 03/30/15 she underwent total laparoscopic hysterectomy, bilateral salpingo-oophorectomy, pelvic sentinel lymph node mapping, and repair of vaginal lacerations caused by removal of the uterus transvaginally.  Her final pathology as noted below.  Sentinel nodes were not identified and node dissection not done since frozen showed grade 1 tumor invading less than 50% (4/9 mm).  Since final path showed LVSI she was referred to Dr. Baruch Gouty for vaginal brachytherapy. She tolerated 6 HDR treatments well.      Surgical Procedure  CASE: [id number]  PATIENT: Pamela Norman  Surgical Pathology Report      SPECIMEN SUBMITTED:  A. Uterus, cervix, and bilateral tubes and ovaries   CLINICAL HISTORY:  None provided   PRE-OPERATIVE DIAGNOSIS:  grade 1 endometrioid endometrial cancer   POST-OPERATIVE DIAGNOSIS:  Same/ total laparoscopic hysterectomy, bilateral salpingo-oophorectomy      DIAGNOSIS:  A. UTERUS; HYSTERECTOMY:  - ENDOMETRIOID CARCINOMA WITH SQUAMOUS DIFFERENTIATION, GRADE 1,  INVADING LESS THAN ONE HALF OF MYOMETRIUM.  - CERVIX NOT INVOLVED.  - INTRAMURAL LEIOMYOMA.   RIGHT AND LEFT FALLOPIAN TUBES AND OVARIES; SALPINGO-OOPHORECTOMY:  - NOT INVOLVED.  - BENIGN SIMPLE CYST OF RIGHT OVARY, 3.2 CM.   Comment:  The cervix was received in two detached pieces. The corpus was received  intact.   ENDOMETRIUM: Hysterectomy, With or Without Other Organs or Tissues  Endometrium, Hysterectomy, With or Without Other Organs or Tissues  Cancer Case Summary  Specimen: Uterine corpus  SPECIMEN  Procedure  Simple hysterectomy  Additional Procedures:   Bilateral salpingo-oophorectomy  Lymph Node Sampling:   Not performed  Specimen Integrity: Intact hysterectomy specimen  TUMOR  Histologic Type:  Endometrioid adenocarcinoma, variant (specify)  with squamous differentiation  Histologic Grade:  FIGO grade 1  EXTENT  Tumor Size:  Greatest dimension (cm)  3.5cm  Myometrial Invasion:   Present  Depth of Myometrial Invasion: Specify depth of invasion (mm)  28mm  Myometrial Thickness (mm):  20mm  Involvement of Cervix:  Not involved  Other Organs Submitted: Right ovary  Not involved  Left ovary  Not involved  Right fallopian tube  Not involved  Left fallopian tube  Not involved  ACCESSORY FINDINGS  Lymph-Vascular Invasion: Present  STAGE (pTNM [FIGO])  Primary Tumor (pT):  pT1a [IA]: Tumor limited to endometrium or invades less than one-half  of the myometrium  Regional Lymph Nodes (pN)  pNX: Cannot be assessed  Pelvic Lymph Nodes: No pelvic nodes submitted or found  Para-aortic Lymph Nodes: No para-aortic nodes submitted or found  Distant Metastasis (pM): Not applicable            Cytology: DIAGNOSIS:  A. PELVIC WASHINGS:  - NEGATIVE FOR MALIGNANCY.    Problem List: Patient Active Problem List   Diagnosis Date Noted  . Endometrial cancer ([place]) 11/13/2016  . Psychosis ([place]) 09/17/2016  . Osteopenia 09/17/2016  . Vitamin D deficiency 09/17/2016  . History of endometrial cancer 05/04/2015  . Hyperlipidemia 05/21/2014  . Medicare annual wellness visit, subsequent 05/19/2013    Past Medical History: Past Medical History:  Diagnosis Date  . Cataract   . Endometrial cancer ([place]) 2016   Grade 1,total laparoscopic hysterectomy  . History of colonoscopy 2014  within normal limits  . History of ovarian cyst 01/2015  . Hyperlipidemia   . Rectal prolapse   . Squamous cell carcinoma    face    Past Surgical History: Past Surgical History:  Procedure Laterality Date  . ABDOMINAL HYSTERECTOMY    .  APPENDECTOMY    . BILATERAL SALPINGOOPHORECTOMY  03/30/15  . CATARACT EXTRACTION W/PHACO Right 02/06/2016   Procedure: CATARACT EXTRACTION PHACO AND INTRAOCULAR LENS PLACEMENT (IOC);  Surgeon: Estill Cotta, MD;  Location: [facility] ORS;  Service: Ophthalmology;  Laterality: Right;  [place] 01:39 AP% 24.9 CDE 47.32 fluid pack lot # [id number]  . CATARACT EXTRACTION W/PHACO Left 03/05/2016   Procedure: CATARACT EXTRACTION PHACO AND INTRAOCULAR LENS PLACEMENT ([place]);  Surgeon: Estill Cotta, MD;  Location: [facility] ORS;  Service: Ophthalmology;  Laterality: Left;  [place] 01:02 AP% 23.2 CDE 26.73 fluid pack lot # [id number]  . COMBINED HYSTEROSCOPY DIAGNOSTIC / D&C  2016  . DILATION AND CURETTAGE OF UTERUS    . dnc    . LAPAROSCOPIC HYSTERECTOMY  03/30/15  . NASAL SINUS SURGERY    . SKIN CANCER EXCISION     UPPER LIP  . TONSILECTOMY, ADENOIDECTOMY, BILATERAL MYRINGOTOMY AND TUBES    . TONSILLECTOMY       OB History:  OB History  Gravida Para Term Preterm AB Living  0 0 0 0 0 0  SAB TAB Ectopic Multiple Live Births  0 0 0 0        Obstetric Comments  Age at first menarche-age [age]    Family History: Family History  Problem Relation Age of Onset  . Colon cancer Father        older age onset  . COPD Father   . COPD Mother   . Breast cancer Neg Hx   . Ovarian cancer Neg Hx   . Diabetes Neg Hx   . Heart disease Neg Hx     Social History: Social History   Social History  . Marital status: Single    Spouse name: N/A  . Number of children: 0  . Years of education: PhD   Occupational History  . Retired    Social History Main Topics  . Smoking status: Former Smoker    Packs/day: 0.25    Years: 20.00    Quit date: 04/13/1981  . Smokeless tobacco: Never Used  . Alcohol use No  . Drug use: No  . Sexual activity: Not Currently   Other Topics Concern  . Not on file   Social History Narrative   Lives in [place]. No children.      Retired [employer]      Diet - regular  diet, herbalife      Exercise - none regular, [employer]      Right-handed.      1-2 cups caffeine daily.    Allergies: No Known Allergies  Current Medications: Current Outpatient Prescriptions  Medication Sig Dispense Refill  . atorvastatin (LIPITOR) 20 MG tablet TAKE 1 TABLET(20 MG) BY MOUTH DAILY 90 tablet 0  . cholecalciferol (VITAMIN D) 1000 units tablet Take 1,000 Units by mouth daily.    . DULoxetine (CYMBALTA) 30 MG capsule 30 mg.     . risperiDONE (RISPERDAL) 1 MG tablet at bedtime.      No current facility-administered medications for this visit.     Review of Systems General: no complaints  HEENT: no complaints  Lungs: no complaints  Cardiac: no complaints  GI: no complaints  GU: no  complaints  Musculoskeletal: no complaints  Extremities: no complaints  Skin: no complaints  Neuro: no complaints  Endocrine: no complaints  Psych: no complaints       Objective:   Vitals:   09/11/17 1434  BP: 134/79  Pulse: 90  Resp: 18  Temp: (!) 97.4 F (36.3 C)  TempSrc: Tympanic  Weight: 192 lb 12.8 oz (87.5 kg)  Body mass index is 31.12 kg/m.  ECOG Performance Status: 0 - Asymptomatic  General appearance: alert, cooperative and appears stated age 80: ATNC Neck: normal Lungs: B CTA Heart: Normal rate and rhythm Abdomen: no palpable masses, no hernias, well healed incisions, soft, nontender, nondistended. Umbilicus normal.  Back: normal to inspection Extremities: no lower extremity edema Neurological exam reveals alert, oriented, normal speech, no focal findings or movement disorder noted  Pelvic: EGBUS: normal, Vagina: well healed with some radiation changes, no lesions, Bimanual: no masses or nodularity.  RV: confirms     Assessment:  Pamela Norman is a 76 y.o. female diagnosed with stage IA, grade 1, endometrioid adenocarcinoma of the endometrium S/P TLH/BSO in 4/16 and vaginal brachytherapy due to 4/9 mm invasion and LVSI.  NED.  Body mass index is 31.12 kg/m.  Plan:   Problem List Items Addressed This Visit      Genitourinary   Endometrial cancer ([place]) - Primary     We will see her again in 6 months.  She will return sooner if there are any concerning symptoms such as pain, discharge, bleeding, GU/GI issues.   Mellody Drown, MD  CC:  Referring Provider: Dr. Hassell Done Defrancesco

## 2017-10-14 ENCOUNTER — Ambulatory Visit: Payer: Medicare Other | Admitting: Family Medicine

## 2017-10-14 ENCOUNTER — Telehealth: Payer: Self-pay

## 2017-10-14 DIAGNOSIS — R7303 Prediabetes: Secondary | ICD-10-CM

## 2017-10-14 DIAGNOSIS — E785 Hyperlipidemia, unspecified: Secondary | ICD-10-CM

## 2017-10-14 DIAGNOSIS — Z5181 Encounter for therapeutic drug level monitoring: Secondary | ICD-10-CM

## 2017-10-14 NOTE — Telephone Encounter (Signed)
I called pt and left a vm to call the office.  °

## 2017-10-14 NOTE — Telephone Encounter (Signed)
Patient cancelled her appointment on 10/18/2017 please call and try to reschedule appt and make lab appt prior to [place].

## 2017-10-14 NOTE — Telephone Encounter (Signed)
Patient would like to have labs done prior to appt on 10/18/2017. Ok to place orders?   Copied from [place] [phone]. Topic: Quick Communication - Appointment Cancellation >> Oct 14, 2017  8:38 AM Ether Griffins B wrote: Patient called to cancel appointment scheduled wanting labs done before having an appt so they have something to talk about. Patient HAS NOT rescheduled their appointment.    Route to department's PEC pool.

## 2017-10-14 NOTE — Telephone Encounter (Signed)
Orders placed. Needs to reschedule. Thanks.

## 2017-10-18 ENCOUNTER — Ambulatory Visit: Payer: Medicare Other | Admitting: Family Medicine

## 2017-10-21 ENCOUNTER — Other Ambulatory Visit: Payer: Self-pay | Admitting: Radiology

## 2017-10-21 DIAGNOSIS — E785 Hyperlipidemia, unspecified: Secondary | ICD-10-CM

## 2017-10-21 DIAGNOSIS — R7303 Prediabetes: Secondary | ICD-10-CM

## 2017-10-21 DIAGNOSIS — Z5181 Encounter for therapeutic drug level monitoring: Secondary | ICD-10-CM

## 2017-11-04 ENCOUNTER — Other Ambulatory Visit (INDEPENDENT_AMBULATORY_CARE_PROVIDER_SITE_OTHER): Payer: Medicare Other

## 2017-11-04 DIAGNOSIS — R7303 Prediabetes: Secondary | ICD-10-CM

## 2017-11-04 DIAGNOSIS — Z5181 Encounter for therapeutic drug level monitoring: Secondary | ICD-10-CM

## 2017-11-04 DIAGNOSIS — E785 Hyperlipidemia, unspecified: Secondary | ICD-10-CM

## 2017-11-04 LAB — LIPID PANEL
CHOL/HDL RATIO: 5
Cholesterol: 167 mg/dL (ref 0–200)
HDL: 30.4 mg/dL — AB (ref >39.00)
LDL CALC: 97 mg/dL (ref 0–99)
NONHDL: 136.57
TRIGLYCERIDES: 197 mg/dL — AB (ref 0.0–149.0)
VLDL: 39.4 mg/dL (ref 0.0–40.0)

## 2017-11-04 LAB — COMPREHENSIVE METABOLIC PANEL
ALBUMIN: 4.3 g/dL (ref 3.5–5.2)
ALK PHOS: 61 U/L (ref 39–117)
ALT: 29 U/L (ref 0–35)
AST: 17 U/L (ref 0–37)
BILIRUBIN TOTAL: 0.6 mg/dL (ref 0.2–1.2)
BUN: 22 mg/dL (ref 6–23)
CALCIUM: 9.8 mg/dL (ref 8.4–10.5)
CHLORIDE: 103 meq/L (ref 96–112)
CO2: 28 mEq/L (ref 19–32)
Creatinine, Ser: 0.91 mg/dL (ref 0.40–1.20)
GFR: 63.92 mL/min (ref >60.00)
Glucose, Bld: 202 mg/dL — ABNORMAL HIGH (ref 70–99)
POTASSIUM: 4.1 meq/L (ref 3.5–5.1)
SODIUM: 139 meq/L (ref 135–145)
Total Protein: 7.3 g/dL (ref 6.0–8.3)

## 2017-11-04 LAB — HEMOGLOBIN A1C: Hgb A1c MFr Bld: 6.5 % (ref 4.6–6.5)

## 2017-11-09 ENCOUNTER — Encounter: Payer: Self-pay | Admitting: Family Medicine

## 2017-11-09 DIAGNOSIS — E119 Type 2 diabetes mellitus without complications: Secondary | ICD-10-CM | POA: Insufficient documentation

## 2017-11-12 ENCOUNTER — Other Ambulatory Visit: Payer: Self-pay | Admitting: Family Medicine

## 2017-11-12 MED ORDER — METFORMIN HCL 500 MG PO TABS: 500.0000 mg | ORAL_TABLET | Freq: Every day | ORAL | 3 refills | Status: DC

## 2017-12-04 ENCOUNTER — Other Ambulatory Visit: Payer: Self-pay | Admitting: Family Medicine

## 2017-12-12 ENCOUNTER — Ambulatory Visit: Payer: Medicare Other | Admitting: Family Medicine

## 2017-12-12 ENCOUNTER — Other Ambulatory Visit: Payer: Self-pay

## 2017-12-12 ENCOUNTER — Ambulatory Visit (INDEPENDENT_AMBULATORY_CARE_PROVIDER_SITE_OTHER): Payer: Medicare Other | Admitting: Family Medicine

## 2017-12-12 ENCOUNTER — Encounter: Payer: Self-pay | Admitting: Family Medicine

## 2017-12-12 VITALS — BP 150/80 | HR 91 | Temp 98.4°F | Wt 188.4 lb

## 2017-12-12 DIAGNOSIS — R030 Elevated blood-pressure reading, without diagnosis of hypertension: Secondary | ICD-10-CM | POA: Insufficient documentation

## 2017-12-12 DIAGNOSIS — L989 Disorder of the skin and subcutaneous tissue, unspecified: Secondary | ICD-10-CM | POA: Diagnosis not present

## 2017-12-12 DIAGNOSIS — E119 Type 2 diabetes mellitus without complications: Secondary | ICD-10-CM

## 2017-12-12 DIAGNOSIS — F29 Unspecified psychosis not due to a substance or known physiological condition: Secondary | ICD-10-CM

## 2017-12-12 DIAGNOSIS — E785 Hyperlipidemia, unspecified: Secondary | ICD-10-CM

## 2017-12-12 DIAGNOSIS — Z8542 Personal history of malignant neoplasm of other parts of uterus: Secondary | ICD-10-CM | POA: Diagnosis not present

## 2017-12-12 NOTE — Assessment & Plan Note (Signed)
Improved control.  Continue Lipitor. 

## 2017-12-12 NOTE — Assessment & Plan Note (Addendum)
Well-controlled.  She will continue metformin.  Plan to recheck A1c at follow-up in May per patient request.  She declined urine microalbumin today.  We will plan to check this at follow-up.

## 2017-12-12 NOTE — Assessment & Plan Note (Signed)
She will continue to follow with oncology. 

## 2017-12-12 NOTE — Patient Instructions (Signed)
Nice to see you. We will have you return in May for follow-up. We will refer you to dermatology. Please follow-up with [facility] health care for your Risperdal.

## 2017-12-12 NOTE — Assessment & Plan Note (Signed)
History of this in the past.  She has essentially taken herself off of Cymbalta.  Also tapered herself down on Risperdal.  I encouraged her to follow-up with her psychiatrist regarding this.

## 2017-12-12 NOTE — Assessment & Plan Note (Addendum)
The lesion sound like they are nevi.  She deferred exam today.  Referred to dermatology.

## 2017-12-12 NOTE — Progress Notes (Signed)
Tommi Rumps, MD Phone: [phone]  Pamela Norman is a 77 y.o. female who presents today for f/u.  DIABETES Disease Monitoring: Blood Sugar ranges-not checking Polyuria/phagia/dipsia- no      Optho-sees them yearly Medications: Compliance- taking metformin Hypoglycemic symptoms- no  HYPERLIPIDEMIA Symptoms Chest pain on exertion:  no    Medications: Compliance- taking lipitor Right upper quadrant pain- no  Muscle aches- no  History of psychosis: Has been on Cymbalta though is only taking it about once a week.  Also taking risperidone every other day.  She is followed by psychiatry for this.  She notes no depression symptoms or anxiety symptoms.  No hallucinations.  No SI.  She is going to follow-up with her psychiatrist in the next month or so.  She reports several moles on her back and one in a more private area per her report.  She would like to see dermatology for these.  She declines my exam today.  She has continued to follow with oncology following surgery and radiation therapy for endometrial cancer.  She notes no recurrent issues.  They have spaced out every 6 months.  Social History   Tobacco Use  Smoking Status Former Smoker  . Packs/day: 0.25  . Years: 20.00  . Pack years: 5.00  . Last attempt to quit: 04/13/1981  . Years since quitting: 36.6  Smokeless Tobacco Never Used     ROS see history of present illness  Objective  Physical Exam Vitals:   12/12/17 0931  BP: (!) 150/80  Pulse: 91  Temp: 98.4 F (36.9 C)  SpO2: 97%    BP Readings from Last 3 Encounters:  12/12/17 (!) 150/80  09/11/17 134/79  08/15/17 118/64   Wt Readings from Last 3 Encounters:  12/12/17 188 lb 6.4 oz (85.5 kg)  09/11/17 192 lb 12.8 oz (87.5 kg)  08/15/17 190 lb 12.8 oz (86.5 kg)    Physical Exam  Constitutional: No distress.  Cardiovascular: Normal rate, regular rhythm and normal heart sounds.  Pulmonary/Chest: Effort normal and breath sounds normal.    Musculoskeletal: She exhibits no edema.  Neurological: She is alert. Gait normal.  Skin: Skin is warm and dry. She is not diaphoretic.   Diabetic Foot Exam - Simple   Simple Foot Form Diabetic Foot exam was performed with the following findings:  Yes 12/12/2017  9:53 AM  Visual Inspection No deformities, no ulcerations, no other skin breakdown bilaterally:  Yes Sensation Testing Intact to touch and monofilament testing bilaterally:  Yes Pulse Check Posterior Tibialis and Dorsalis pulse intact bilaterally:  Yes Comments      Assessment/Plan: Please see individual problem list.  Diabetes mellitus without complication ([place]) Well-controlled.  She will continue metformin.  Plan to recheck A1c at follow-up in May per patient request.  She declined urine microalbumin today.  We will plan to check this at follow-up.  History of endometrial cancer She will continue to follow with oncology.  Hyperlipidemia Improved control.  Continue Lipitor.  Psychosis History of this in the past.  She has essentially taken herself off of Cymbalta.  Also tapered herself down on Risperdal.  I encouraged her to follow-up with her psychiatrist regarding this.  Skin lesions The lesion sound like they are nevi.  She deferred exam today.  Referred to dermatology.  Elevated blood pressure reading Not previously elevated.  She will return in 2 weeks for recheck with nursing.   Pamela Norman was seen today for establish care.  Diagnoses and all orders for this visit:  Skin lesions -     Ambulatory referral to Dermatology  Diabetes mellitus without complication ([place]) -     Cancel: Urine Microalbumin w/creat. ratio  History of endometrial cancer  Hyperlipidemia, unspecified hyperlipidemia type  Psychosis, unspecified psychosis type ([place])  Elevated blood pressure reading    Orders Placed This Encounter  Procedures  . Ambulatory referral to Dermatology    Referral Priority:   Routine    Referral  Type:   Consultation    Referral Reason:   Specialty Services Required    Requested Specialty:   Dermatology    Number of Visits Requested:   1    No orders of the defined types were placed in this encounter.    Tommi Rumps, MD [place]

## 2017-12-12 NOTE — Assessment & Plan Note (Signed)
Not previously elevated.  She will return in 2 weeks for recheck with nursing.

## 2017-12-26 ENCOUNTER — Ambulatory Visit (INDEPENDENT_AMBULATORY_CARE_PROVIDER_SITE_OTHER): Payer: Medicare Other

## 2017-12-26 DIAGNOSIS — R030 Elevated blood-pressure reading, without diagnosis of hypertension: Secondary | ICD-10-CM | POA: Diagnosis not present

## 2017-12-26 NOTE — Progress Notes (Signed)
Patient comes in today for a blood pressure check. Patient was seen for an office visit appointment on 12/12/2017 and had an elevated blood pressure reading of 150/80. She is not on any blood pressure medications at this time.Today her blood pressure in her left arm is 148/86 and pulse of 113. After resting for 10 minutes her blood pressure was 140/82 and pulse of 101. Patient states she will work on diet and exercise.

## 2017-12-28 NOTE — Progress Notes (Signed)
BP improved to goal level for her age on recheck.  She should have a recheck in about a month with nursing to ensure it remains stable.  If she could check it at home that would be great as well.  Heart rate seems to be elevated as well.  Please see if she has had any palpitations or other symptoms.  Thanks.

## 2017-12-31 NOTE — Progress Notes (Signed)
Patient notified and scheduled 

## 2018-01-07 DIAGNOSIS — F060 Psychotic disorder with hallucinations due to known physiological condition: Secondary | ICD-10-CM | POA: Diagnosis not present

## 2018-02-04 ENCOUNTER — Ambulatory Visit: Payer: Medicare Other

## 2018-02-05 ENCOUNTER — Ambulatory Visit: Payer: Medicare Other

## 2018-02-12 ENCOUNTER — Encounter: Payer: Self-pay | Admitting: *Deleted

## 2018-02-12 ENCOUNTER — Ambulatory Visit (INDEPENDENT_AMBULATORY_CARE_PROVIDER_SITE_OTHER): Payer: Medicare Other | Admitting: *Deleted

## 2018-02-12 VITALS — BP 138/86 | HR 86 | Resp 16

## 2018-02-12 DIAGNOSIS — R030 Elevated blood-pressure reading, without diagnosis of hypertension: Secondary | ICD-10-CM | POA: Diagnosis not present

## 2018-02-12 NOTE — Progress Notes (Signed)
Patient came into office for 1 month recheck on BP from nurse visit dated 12/26/17 Patient BP in left arm 138/86 pulse 86.

## 2018-02-16 NOTE — Progress Notes (Signed)
BP stable and at goal for age.  Continue to periodically monitor.

## 2018-02-17 NOTE — Progress Notes (Signed)
Patient notified

## 2018-03-06 ENCOUNTER — Telehealth: Payer: Self-pay | Admitting: Family Medicine

## 2018-03-06 MED ORDER — ATORVASTATIN CALCIUM 20 MG PO TABS: 20.0000 mg | ORAL_TABLET | Freq: Every day | ORAL | 0 refills | Status: DC

## 2018-03-06 NOTE — Telephone Encounter (Signed)
Sent to pharmacy 

## 2018-03-06 NOTE — Telephone Encounter (Signed)
I called pt to resch appt from 04/11/18 pt states she will need a refill for atorvastatin (LIPITOR) 20 MG tablet before her next appt on 05/28. Please advise? Thank you!

## 2018-03-19 ENCOUNTER — Inpatient Hospital Stay: Payer: Medicare Other | Attending: Obstetrics and Gynecology | Admitting: Obstetrics and Gynecology

## 2018-03-19 ENCOUNTER — Encounter: Payer: Self-pay | Admitting: Obstetrics and Gynecology

## 2018-03-19 VITALS — BP 144/89 | HR 86 | Temp 97.9°F | Resp 18 | Ht 66.0 in | Wt 181.5 lb

## 2018-03-19 DIAGNOSIS — Z87891 Personal history of nicotine dependence: Secondary | ICD-10-CM | POA: Diagnosis not present

## 2018-03-19 DIAGNOSIS — Z90722 Acquired absence of ovaries, bilateral: Secondary | ICD-10-CM | POA: Insufficient documentation

## 2018-03-19 DIAGNOSIS — Z90710 Acquired absence of both cervix and uterus: Secondary | ICD-10-CM | POA: Diagnosis not present

## 2018-03-19 DIAGNOSIS — C541 Malignant neoplasm of endometrium: Secondary | ICD-10-CM

## 2018-03-19 DIAGNOSIS — Z923 Personal history of irradiation: Secondary | ICD-10-CM | POA: Diagnosis not present

## 2018-03-19 DIAGNOSIS — Z8542 Personal history of malignant neoplasm of other parts of uterus: Secondary | ICD-10-CM | POA: Insufficient documentation

## 2018-03-19 NOTE — Progress Notes (Signed)
Chaperoned pelvic exam. Follow up in 6 months with Gyn Onc. Oncology Nurse Navigator Documentation      )                                                     Time Spent with Patient: 15 (03/19/18 1400)

## 2018-03-19 NOTE — Progress Notes (Signed)
No gyn concerns 

## 2018-03-19 NOTE — Progress Notes (Signed)
Gynecologic Oncology Interval Note  Referring Provider: Dr. Hassell Done Defrancesco  Chief Concern: Endometrial cancer surveillance  Subjective:  Pamela Norman is a 77 y.o. female with stage IA grade 1 endometrial cancer.    She returns to clinic today for routine 6 month follow up. She denies specific complaints today and reports feeling well.   HPI Please refer to prior notes for complete details. On 03/30/15 she underwent total laparoscopic hysterectomy, bilateral salpingo-oophorectomy, pelvic sentinel lymph node mapping, and repair of vaginal lacerations caused by removal of the uterus transvaginally.  Her final pathology as noted below.  Sentinel nodes were not identified and node dissection not done since frozen showed grade 1 tumor invading less than 50% (4/9 mm).  Since final path showed LVSI she was referred to Dr. Baruch Gouty for vaginal brachytherapy. She tolerated 6 HDR treatments well.      Surgical Procedure  CASE: [id number]  PATIENT: Pamela Norman  Surgical Pathology Report      SPECIMEN SUBMITTED:  A. Uterus, cervix, and bilateral tubes and ovaries   CLINICAL HISTORY:  None provided   PRE-OPERATIVE DIAGNOSIS:  grade 1 endometrioid endometrial cancer   POST-OPERATIVE DIAGNOSIS:  Same/ total laparoscopic hysterectomy, bilateral salpingo-oophorectomy      DIAGNOSIS:  A. UTERUS; HYSTERECTOMY:  - ENDOMETRIOID CARCINOMA WITH SQUAMOUS DIFFERENTIATION, GRADE 1,  INVADING LESS THAN ONE HALF OF MYOMETRIUM.  - CERVIX NOT INVOLVED.  - INTRAMURAL LEIOMYOMA.   RIGHT AND LEFT FALLOPIAN TUBES AND OVARIES; SALPINGO-OOPHORECTOMY:  - NOT INVOLVED.  - BENIGN SIMPLE CYST OF RIGHT OVARY, 3.2 CM.   Comment:  The cervix was received in two detached pieces. The corpus was received  intact.   ENDOMETRIUM: Hysterectomy, With or Without Other Organs or Tissues  Endometrium, Hysterectomy, With or Without Other Organs or Tissues  Cancer Case Summary  Specimen: Uterine corpus   SPECIMEN  Procedure  Simple hysterectomy  Additional Procedures:  Bilateral salpingo-oophorectomy  Lymph Node Sampling:   Not performed  Specimen Integrity: Intact hysterectomy specimen  TUMOR  Histologic Type:  Endometrioid adenocarcinoma, variant (specify)  with squamous differentiation  Histologic Grade:  FIGO grade 1  EXTENT  Tumor Size:  Greatest dimension (cm)  3.5cm  Myometrial Invasion:   Present  Depth of Myometrial Invasion: Specify depth of invasion (mm)  25mm  Myometrial Thickness (mm):  21mm  Involvement of Cervix:  Not involved  Other Organs Submitted: Right ovary  Not involved  Left ovary  Not involved  Right fallopian tube  Not involved  Left fallopian tube  Not involved  ACCESSORY FINDINGS  Lymph-Vascular Invasion: Present  STAGE (pTNM [FIGO])  Primary Tumor (pT):  pT1a [IA]: Tumor limited to endometrium or invades less than one-half  of the myometrium  Regional Lymph Nodes (pN)  pNX: Cannot be assessed  Pelvic Lymph Nodes: No pelvic nodes submitted or found  Para-aortic Lymph Nodes: No para-aortic nodes submitted or found  Distant Metastasis (pM): Not applicable            Cytology: DIAGNOSIS:  A. PELVIC WASHINGS:  - NEGATIVE FOR MALIGNANCY.    Problem List: Patient Active Problem List   Diagnosis Date Noted  . Skin lesions 12/12/2017  . Elevated blood pressure reading 12/12/2017  . Diabetes mellitus without complication ([place]) 53/61/4431  . Psychosis ([place]) 09/17/2016  . Osteopenia 09/17/2016  . Vitamin D deficiency 09/17/2016  . History of endometrial cancer 05/04/2015  . Hyperlipidemia 05/21/2014    Past Medical History: Past Medical History:  Diagnosis Date  . Cataract   .  Endometrial cancer ([place]) 2016   Grade 1,total laparoscopic hysterectomy  . History of colonoscopy 2014   within normal limits  . History of ovarian cyst 01/2015  . Hyperlipidemia   . Rectal prolapse   . Squamous cell carcinoma    face     Past Surgical History: Past Surgical History:  Procedure Laterality Date  . ABDOMINAL HYSTERECTOMY    . APPENDECTOMY    . BILATERAL SALPINGOOPHORECTOMY  03/30/15  . CATARACT EXTRACTION W/PHACO Right 02/06/2016   Procedure: CATARACT EXTRACTION PHACO AND INTRAOCULAR LENS PLACEMENT (IOC);  Surgeon: Estill Cotta, MD;  Location: [facility] ORS;  Service: Ophthalmology;  Laterality: Right;  [place] 01:39 AP% 24.9 CDE 47.32 fluid pack lot # [id number]  . CATARACT EXTRACTION W/PHACO Left 03/05/2016   Procedure: CATARACT EXTRACTION PHACO AND INTRAOCULAR LENS PLACEMENT ([place]);  Surgeon: Estill Cotta, MD;  Location: [facility] ORS;  Service: Ophthalmology;  Laterality: Left;  [place] 01:02 AP% 23.2 CDE 26.73 fluid pack lot # [id number]  . COMBINED HYSTEROSCOPY DIAGNOSTIC / D&C  2016  . DILATION AND CURETTAGE OF UTERUS    . dnc    . LAPAROSCOPIC HYSTERECTOMY  03/30/15  . NASAL SINUS SURGERY    . SKIN CANCER EXCISION     UPPER LIP  . TONSILECTOMY, ADENOIDECTOMY, BILATERAL MYRINGOTOMY AND TUBES    . TONSILLECTOMY      OB History:  OB History  Gravida Para Term Preterm AB Living  0 0 0 0 0 0  SAB TAB Ectopic Multiple Live Births  0 0 0 0    Obstetric Comments  Age at first menarche-age [age]    Family History: Family History  Problem Relation Age of Onset  . Colon cancer Father        older age onset  . COPD Father   . COPD Mother   . Breast cancer Neg Hx   . Ovarian cancer Neg Hx   . Diabetes Neg Hx   . Heart disease Neg Hx     Social History: Social History   Socioeconomic History  . Marital status: Single    Spouse name: Not on file  . Number of children: 0  . Years of education: PhD  . Highest education level: Not on file  Occupational History  . Occupation: Retired  Scientific laboratory technician  . Financial resource strain: Not on file  . Food insecurity:    Worry: Not on file    Inability: Not on file  . Transportation needs:    Medical: Not on file    Non-medical: Not on file  Tobacco  Use  . Smoking status: Former Smoker    Packs/day: 0.25    Years: 20.00    Pack years: 5.00    Last attempt to quit: 04/13/1981    Years since quitting: 36.9  . Smokeless tobacco: Never Used  Substance and Sexual Activity  . Alcohol use: No  . Drug use: No  . Sexual activity: Not Currently  Lifestyle  . Physical activity:    Days per week: Not on file    Minutes per session: Not on file  . Stress: Not on file  Relationships  . Social connections:    Talks on phone: Not on file    Gets together: Not on file    Attends religious service: Not on file    Active member of club or organization: Not on file    Attends meetings of clubs or organizations: Not on file    Relationship status: Not on file  .  Intimate partner violence:    Fear of current or ex partner: Not on file    Emotionally abused: Not on file    Physically abused: Not on file    Forced sexual activity: Not on file  Other Topics Concern  . Not on file  Social History Narrative   Lives in [place]. No children.      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      1-2 cups caffeine daily.    Allergies: No Known Allergies  Current Medications: Current Outpatient Medications  Medication Sig Dispense Refill  . atorvastatin (LIPITOR) 20 MG tablet Take 1 tablet (20 mg total) by mouth daily. Must keep upcoming appt for a 90 day supply 15 tablet 0  . cholecalciferol (VITAMIN D) 1000 units tablet Take 1,000 Units by mouth daily.    . DULoxetine (CYMBALTA) 30 MG capsule 30 mg.     . metFORMIN (GLUCOPHAGE) 500 MG tablet Take 1 tablet (500 mg total) by mouth daily with breakfast. 90 tablet 3  . Multiple Vitamin (MULTIVITAMIN) tablet Take 1 tablet by mouth daily.    . risperiDONE (RISPERDAL) 1 MG tablet at bedtime.      No current facility-administered medications for this visit.     Review of Systems General: no  complaints  HEENT: no complaints  Lungs: no complaints  Cardiac: no complaints  GI: no complaints  GU: no complaints  Musculoskeletal: no complaints  Extremities: no complaints  Skin: no complaints  Neuro: no complaints  Endocrine: no complaints  Psych: no complaints      Objective:   Vitals:   03/19/18 1339  BP: (!) 144/89  Pulse: 86  Resp: 18  Temp: 97.9 F (36.6 C)  TempSrc: Tympanic  Weight: 181 lb 8 oz (82.3 kg)  Height: 5\' 6"  (1.676 m)  Body mass index is 29.29 kg/m.  ECOG Performance Status: 0 - Asymptomatic  General appearance: alert, cooperative and appears stated age. Unaccompanied.  HEENT: ATNC Neck: normal Lungs: B CTA Heart: Normal rate and rhythm Abdomen: no palpable masses, no hernias, well healed incisions, soft, nontender, nondistended. Umbilicus normal.  Back: normal to inspection. No tenderness Extremities: no lower extremity edema Neurological exam reveals alert, oriented, normal speech, no focal findings or movement disorder noted  Pelvic: EGBUS: normal, Vagina: well healed with some mild radiation changes, no lesions, Bimanual: no masses or nodularity.  RV: confirms     Assessment:  Pamela Norman is a 77 y.o. female diagnosed with stage IA, grade 1, endometrioid adenocarcinoma of the endometrium S/P TLH/BSO in 4/16 and vaginal brachytherapy due to 4/9 mm invasion and LVSI. NED.  Body mass index is 29.29 kg/m.  Plan:   Problem List Items Addressed This Visit    None    Visit Diagnoses    Endometrial cancer ([place])    -  Primary     We will see her again in 6 months.  We discussed that she should call to report any concerning symptoms such as pain, discharge, bleeding, or other GU or GI issues.     Beckey Rutter, DNP, AGNP-C [place] [facility] [phone] (work cell) [phone] (office) 03/19/18 5:54 PM  Medical screening examination/treatment/procedure(s) were performed by non-physician practitioner and as  supervising physician I was immediately available for consultation/collaboration. Mellody Drown, MD  CC:  Referring Provider: Dr. Hassell Done  Defrancesco   Medical screening examination/treatment/procedure(s) were performed by non-physician practitioner and as supervising physician I was immediately available for consultation/collaboration. Mellody Drown, MD

## 2018-03-26 DIAGNOSIS — F060 Psychotic disorder with hallucinations due to known physiological condition: Secondary | ICD-10-CM | POA: Diagnosis not present

## 2018-04-11 ENCOUNTER — Ambulatory Visit: Payer: Medicare Other | Admitting: Family Medicine

## 2018-05-06 ENCOUNTER — Other Ambulatory Visit: Payer: Self-pay

## 2018-05-06 ENCOUNTER — Ambulatory Visit (INDEPENDENT_AMBULATORY_CARE_PROVIDER_SITE_OTHER): Payer: Medicare Other | Admitting: Family Medicine

## 2018-05-06 ENCOUNTER — Encounter: Payer: Self-pay | Admitting: Family Medicine

## 2018-05-06 VITALS — BP 126/82 | HR 88 | Temp 97.7°F | Wt 179.8 lb

## 2018-05-06 DIAGNOSIS — Z780 Asymptomatic menopausal state: Secondary | ICD-10-CM

## 2018-05-06 DIAGNOSIS — E119 Type 2 diabetes mellitus without complications: Secondary | ICD-10-CM | POA: Diagnosis not present

## 2018-05-06 DIAGNOSIS — E785 Hyperlipidemia, unspecified: Secondary | ICD-10-CM

## 2018-05-06 DIAGNOSIS — E559 Vitamin D deficiency, unspecified: Secondary | ICD-10-CM | POA: Diagnosis not present

## 2018-05-06 DIAGNOSIS — Z1231 Encounter for screening mammogram for malignant neoplasm of breast: Secondary | ICD-10-CM | POA: Diagnosis not present

## 2018-05-06 DIAGNOSIS — M8580 Other specified disorders of bone density and structure, unspecified site: Secondary | ICD-10-CM

## 2018-05-06 DIAGNOSIS — Z1239 Encounter for other screening for malignant neoplasm of breast: Secondary | ICD-10-CM

## 2018-05-06 MED ORDER — ATORVASTATIN CALCIUM 20 MG PO TABS: 20.0000 mg | ORAL_TABLET | Freq: Every day | ORAL | 1 refills | Status: DC

## 2018-05-06 NOTE — Assessment & Plan Note (Signed)
Continue vitamin D.  We will check a vitamin D level.  Encouraged adequate calcium intake.  Discussed repeat DEXA scan and we will arrange this at her follow-up.

## 2018-05-06 NOTE — Assessment & Plan Note (Signed)
Check labs.  Restart Lipitor.

## 2018-05-06 NOTE — Progress Notes (Signed)
  Tommi Rumps, MD Phone: [phone]  Pamela Norman is a 77 y.o. female who presents today for f/u.  CC: dm, hld  DIABETES Disease Monitoring: Blood Sugar ranges-not checking Polyuria/phagia/dipsia- no      Optho- reports within the past year Medications: Compliance- taking metformin Hypoglycemic symptoms- no  HYPERLIPIDEMIA Symptoms Chest pain on exertion:  no    Medications: Compliance- not taking lipitor over the past month, ran out Right upper quadrant pain- no  Muscle aches- no  Osteopenia: Recently restarted her vitamin D supplement.  No recent broken bones.  Does not take calcium supplementation.  Drinks milk for this.  DEXA scan in September 2017.     Social History   Tobacco Use  Smoking Status Former Smoker  . Packs/day: 0.25  . Years: 20.00  . Pack years: 5.00  . Last attempt to quit: 04/13/1981  . Years since quitting: 37.0  Smokeless Tobacco Never Used     ROS see history of present illness  Objective  Physical Exam Vitals:   05/06/18 1444  BP: 126/82  Pulse: 88  Temp: 97.7 F (36.5 C)  SpO2: 96%    BP Readings from Last 3 Encounters:  05/06/18 126/82  03/19/18 (!) 144/89  02/12/18 138/86   Wt Readings from Last 3 Encounters:  05/06/18 179 lb 12.8 oz (81.6 kg)  03/19/18 181 lb 8 oz (82.3 kg)  12/12/17 188 lb 6.4 oz (85.5 kg)    Physical Exam  Constitutional: No distress.  Cardiovascular: Normal rate, regular rhythm and normal heart sounds.  Pulmonary/Chest: Effort normal and breath sounds normal.  Musculoskeletal: She exhibits no edema.  Neurological: She is alert.  Skin: Skin is warm and dry. She is not diaphoretic.     Assessment/Plan: Please see individual problem list.  Diabetes mellitus without complication (HCC) Check A1c.  Continue metformin.  Hyperlipidemia Check labs.  Restart Lipitor.  Osteopenia Continue vitamin D.  We will check a vitamin D level.  Encouraged adequate calcium intake.  Discussed repeat DEXA  scan and we will arrange this at her follow-up.   Health Maintenance: Patient deferred DEXA scan and mammogram to 2-year follow-up in September 2019.  Orders Placed This Encounter  Procedures  . MM 3D SCREEN BREAST BILATERAL    Standing Status:   Future    Standing Expiration Date:   07/07/2019    Order Specific Question:   Reason for Exam (SYMPTOM  OR DIAGNOSIS REQUIRED)    Answer:   breast cancer screening    Order Specific Question:   Preferred imaging location?    Answer:   [facility]  . DG Bone Density    Standing Status:   Future    Standing Expiration Date:   07/07/2019    Order Specific Question:   Reason for Exam (SYMPTOM  OR DIAGNOSIS REQUIRED)    Answer:   postmenopausal estrogen deficiency    Order Specific Question:   Preferred imaging location?    Answer:   [facility]  . HgB A1c  . Direct LDL  . Comp Met (CMET)  . Vitamin D (25 hydroxy)    Meds ordered this encounter  Medications  . atorvastatin (LIPITOR) 20 MG tablet    Sig: Take 1 tablet (20 mg total) by mouth daily.    Dispense:  90 tablet    Refill:  1     Tommi Rumps, MD [place]

## 2018-05-06 NOTE — Patient Instructions (Signed)
Nice to see you. We will check lab work today and contact you with the results. 

## 2018-05-06 NOTE — Assessment & Plan Note (Signed)
Check A1c.  Continue metformin. 

## 2018-05-07 ENCOUNTER — Other Ambulatory Visit: Payer: Self-pay | Admitting: Family Medicine

## 2018-05-07 ENCOUNTER — Telehealth: Payer: Self-pay | Admitting: Family Medicine

## 2018-05-07 ENCOUNTER — Telehealth: Payer: Self-pay

## 2018-05-07 DIAGNOSIS — E785 Hyperlipidemia, unspecified: Secondary | ICD-10-CM

## 2018-05-07 DIAGNOSIS — E559 Vitamin D deficiency, unspecified: Secondary | ICD-10-CM

## 2018-05-07 LAB — COMPREHENSIVE METABOLIC PANEL
ALT: 13 U/L (ref 0–35)
AST: 11 U/L (ref 0–37)
Albumin: 4.2 g/dL (ref 3.5–5.2)
Alkaline Phosphatase: 60 U/L (ref 39–117)
BUN: 22 mg/dL (ref 6–23)
CO2: 30 meq/L (ref 19–32)
Calcium: 9.7 mg/dL (ref 8.4–10.5)
Chloride: 103 mEq/L (ref 96–112)
Creatinine, Ser: 0.99 mg/dL (ref 0.40–1.20)
GFR: 57.92 mL/min — AB (ref >60.00)
Glucose, Bld: 99 mg/dL (ref 70–99)
POTASSIUM: 4.3 meq/L (ref 3.5–5.1)
Sodium: 141 mEq/L (ref 135–145)
Total Bilirubin: 0.3 mg/dL (ref 0.2–1.2)
Total Protein: 7.3 g/dL (ref 6.0–8.3)

## 2018-05-07 LAB — VITAMIN D 25 HYDROXY (VIT D DEFICIENCY, FRACTURES): VITD: 26.8 ng/mL — ABNORMAL LOW (ref 30.00–100.00)

## 2018-05-07 LAB — LDL CHOLESTEROL, DIRECT: LDL DIRECT: 140 mg/dL

## 2018-05-07 LAB — HEMOGLOBIN A1C: HEMOGLOBIN A1C: 6.2 % (ref 4.6–6.5)

## 2018-05-07 NOTE — Telephone Encounter (Signed)
-----   Message from Leone Haven, MD sent at 05/07/2018 12:41 PM EDT ----- Please let the patient know her LDL cholesterol is worse than it was previously.  She needs to resume her Lipitor and have this rechecked in about a month.  Please place an order for direct LDL and hepatic function panel for diagnosis of hyperlipidemia.  Her vitamin D is slightly low.  Please find out how much vitamin D she is taking.  Her kidney function is minimally worse compared to her prior baseline.  We will need to recheck that with her labs in 1 month.  Thanks.

## 2018-05-07 NOTE — Telephone Encounter (Signed)
Copied from [place] [id number]. Topic: Quick Communication - See Telephone Encounter >> May 07, 2018 11:19 AM Aurelio Brash B wrote: CRM for notification. See Telephone encounter for: 05/07/18. [facility]   contact number [phone],  Jackelyn Poling is requesting  to have orders re faxed  because the Drs signature is missing

## 2018-05-07 NOTE — Telephone Encounter (Signed)
Please advise 

## 2018-05-07 NOTE — Progress Notes (Signed)
scheduled

## 2018-05-07 NOTE — Telephone Encounter (Signed)
Faxed new order to [phone]

## 2018-06-13 ENCOUNTER — Other Ambulatory Visit (INDEPENDENT_AMBULATORY_CARE_PROVIDER_SITE_OTHER): Payer: Medicare Other

## 2018-06-13 DIAGNOSIS — E785 Hyperlipidemia, unspecified: Secondary | ICD-10-CM | POA: Diagnosis not present

## 2018-06-13 DIAGNOSIS — E559 Vitamin D deficiency, unspecified: Secondary | ICD-10-CM

## 2018-06-13 LAB — HEPATIC FUNCTION PANEL
ALBUMIN: 4.2 g/dL (ref 3.5–5.2)
ALK PHOS: 62 U/L (ref 39–117)
ALT: 15 U/L (ref 0–35)
AST: 11 U/L (ref 0–37)
Bilirubin, Direct: 0.1 mg/dL (ref 0.0–0.3)
Total Bilirubin: 0.4 mg/dL (ref 0.2–1.2)
Total Protein: 6.9 g/dL (ref 6.0–8.3)

## 2018-06-13 LAB — VITAMIN D 25 HYDROXY (VIT D DEFICIENCY, FRACTURES): VITD: 45.67 ng/mL (ref 30.00–100.00)

## 2018-06-13 LAB — LDL CHOLESTEROL, DIRECT: Direct LDL: 78 mg/dL

## 2018-06-18 ENCOUNTER — Other Ambulatory Visit: Payer: Self-pay | Admitting: Family Medicine

## 2018-06-18 DIAGNOSIS — E785 Hyperlipidemia, unspecified: Secondary | ICD-10-CM

## 2018-06-18 MED ORDER — ATORVASTATIN CALCIUM 40 MG PO TABS: 40.0000 mg | ORAL_TABLET | Freq: Every day | ORAL | 1 refills | Status: DC

## 2018-06-19 DIAGNOSIS — F060 Psychotic disorder with hallucinations due to known physiological condition: Secondary | ICD-10-CM | POA: Diagnosis not present

## 2018-07-21 ENCOUNTER — Other Ambulatory Visit (INDEPENDENT_AMBULATORY_CARE_PROVIDER_SITE_OTHER): Payer: Medicare Other

## 2018-07-21 DIAGNOSIS — E785 Hyperlipidemia, unspecified: Secondary | ICD-10-CM

## 2018-07-21 LAB — HEPATIC FUNCTION PANEL
ALK PHOS: 75 U/L (ref 39–117)
ALT: 18 U/L (ref 0–35)
AST: 11 U/L (ref 0–37)
Albumin: 4.5 g/dL (ref 3.5–5.2)
BILIRUBIN DIRECT: 0.1 mg/dL (ref 0.0–0.3)
Total Bilirubin: 0.7 mg/dL (ref 0.2–1.2)
Total Protein: 7.2 g/dL (ref 6.0–8.3)

## 2018-07-21 LAB — LDL CHOLESTEROL, DIRECT: LDL DIRECT: 89 mg/dL

## 2018-08-18 ENCOUNTER — Ambulatory Visit (INDEPENDENT_AMBULATORY_CARE_PROVIDER_SITE_OTHER): Payer: Medicare Other

## 2018-08-18 VITALS — BP 118/72 | HR 86 | Temp 98.4°F | Resp 15 | Ht 66.0 in | Wt 171.1 lb

## 2018-08-18 DIAGNOSIS — Z0000 Encounter for general adult medical examination without abnormal findings: Secondary | ICD-10-CM

## 2018-08-18 NOTE — Progress Notes (Signed)
Subjective:   Pamela Norman is a 77 y.o. female who presents for Medicare Annual (Subsequent) preventive examination.  Review of Systems:  No ROS.  Medicare Wellness Visit. Additional risk factors are reflected in the social history. Cardiac Risk Factors include: advanced age (>82men, >26 women);diabetes mellitus     Objective:     Vitals: BP 118/72 (BP Location: Right Arm, Patient Position: Sitting, Cuff Size: Normal)   Pulse 86   Temp 98.4 F (36.9 C) (Oral)   Resp 15   Ht 5\' 6"  (1.676 m)   Wt 171 lb 1.9 oz (77.6 kg)   SpO2 95%   BMI 27.62 kg/m   Body mass index is 27.62 kg/m.  Advanced Directives 08/18/2018 03/19/2018 09/11/2017 08/15/2017 03/06/2017 09/05/2016 08/09/2016  Does Patient Have a Medical Advance Directive? No No No No No No No  Does patient want to make changes to medical advance directive? No - Patient declined - - - - - -  Would patient like information on creating a medical advance directive? - No - Patient declined No - Patient declined No - Patient declined No - Patient declined No - patient declined information Yes - Scientist, clinical (histocompatibility and immunogenetics) given    Tobacco Social History   Tobacco Use  Smoking Status Former Smoker  . Packs/day: 0.25  . Years: 20.00  . Pack years: 5.00  . Last attempt to quit: 04/13/1981  . Years since quitting: 37.3  Smokeless Tobacco Never Used     Counseling given: Not Answered   Clinical Intake:  Pre-visit preparation completed: Yes  Pain : No/denies pain     Nutritional Status: BMI 25 -29 Overweight Diabetes: Yes(Followed by pcp)  How often do you need to have someone help you when you read instructions, pamphlets, or other written materials from your doctor or pharmacy?: 1 - Never  Interpreter Needed?: No     Past Medical History:  Diagnosis Date  . Cataract   . Endometrial cancer ([place]) 2016   Grade 1,total laparoscopic hysterectomy  . History of colonoscopy 2014   within normal limits  . History of ovarian cyst  01/2015  . Hyperlipidemia   . Rectal prolapse   . Squamous cell carcinoma    face   Past Surgical History:  Procedure Laterality Date  . ABDOMINAL HYSTERECTOMY    . APPENDECTOMY    . BILATERAL SALPINGOOPHORECTOMY  03/30/15  . CATARACT EXTRACTION W/PHACO Right 02/06/2016   Procedure: CATARACT EXTRACTION PHACO AND INTRAOCULAR LENS PLACEMENT (IOC);  Surgeon: Estill Cotta, MD;  Location: [facility] ORS;  Service: Ophthalmology;  Laterality: Right;  [place] 01:39 AP% 24.9 CDE 47.32 fluid pack lot # [id number]  . CATARACT EXTRACTION W/PHACO Left 03/05/2016   Procedure: CATARACT EXTRACTION PHACO AND INTRAOCULAR LENS PLACEMENT ([place]);  Surgeon: Estill Cotta, MD;  Location: [facility] ORS;  Service: Ophthalmology;  Laterality: Left;  [place] 01:02 AP% 23.2 CDE 26.73 fluid pack lot # [id number]  . COMBINED HYSTEROSCOPY DIAGNOSTIC / D&C  2016  . DILATION AND CURETTAGE OF UTERUS    . dnc    . LAPAROSCOPIC HYSTERECTOMY  03/30/15  . NASAL SINUS SURGERY    . SKIN CANCER EXCISION     UPPER LIP  . TONSILECTOMY, ADENOIDECTOMY, BILATERAL MYRINGOTOMY AND TUBES    . TONSILLECTOMY     Family History  Problem Relation Age of Onset  . Colon cancer Father        older age onset  . COPD Father   . COPD Mother   . Breast cancer Neg  Hx   . Ovarian cancer Neg Hx   . Diabetes Neg Hx   . Heart disease Neg Hx    Social History   Socioeconomic History  . Marital status: Single    Spouse name: Not on file  . Number of children: 0  . Years of education: PhD  . Highest education level: Not on file  Occupational History  . Occupation: Retired  Scientific laboratory technician  . Financial resource strain: Not hard at all  . Food insecurity:    Worry: Not on file    Inability: Not on file  . Transportation needs:    Medical: No    Non-medical: No  Tobacco Use  . Smoking status: Former Smoker    Packs/day: 0.25    Years: 20.00    Pack years: 5.00    Last attempt to quit: 04/13/1981    Years since quitting: 37.3  . Smokeless  tobacco: Never Used  Substance and Sexual Activity  . Alcohol use: No  . Drug use: No  . Sexual activity: Not Currently  Lifestyle  . Physical activity:    Days per week: 5 days    Minutes per session: 30 min  . Stress: Not at all  Relationships  . Social connections:    Talks on phone: Not on file    Gets together: Not on file    Attends religious service: Not on file    Active member of club or organization: Not on file    Attends meetings of clubs or organizations: Not on file    Relationship status: Not on file  Other Topics Concern  . Not on file  Social History Narrative   Lives in [place]. No children.      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      1-2 cups caffeine daily.    Outpatient Encounter Medications as of 08/18/2018  Medication Sig  . atorvastatin (LIPITOR) 40 MG tablet Take 1 tablet (40 mg total) by mouth daily.  . cholecalciferol (VITAMIN D) 1000 units tablet Take 1,000 Units by mouth daily.  . DULoxetine (CYMBALTA) 30 MG capsule 30 mg.   . metFORMIN (GLUCOPHAGE) 500 MG tablet Take 1 tablet (500 mg total) by mouth daily with breakfast.  . Multiple Vitamin (MULTIVITAMIN) tablet Take 1 tablet by mouth daily.  . risperiDONE (RISPERDAL) 1 MG tablet at bedtime.    No facility-administered encounter medications on file as of 08/18/2018.     Activities of Daily Living In your present state of health, do you have any difficulty performing the following activities: 08/18/2018  Hearing? N  Vision? N  Difficulty concentrating or making decisions? N  Walking or climbing stairs? N  Dressing or bathing? N  Doing errands, shopping? N  Preparing Food and eating ? N  Using the Toilet? N  In the past six months, have you accidently leaked urine? N  Do you have problems with loss of bowel control? N  Managing your Medications? N  Managing your Finances? N    Housekeeping or managing your Housekeeping? N  Some recent data might be hidden    Patient Care Team: Leone Haven, MD as PCP - General (Family Medicine)    Assessment:   This is a routine wellness examination for [place]. The goal of the wellness visit is to assist the patient how to  close the gaps in care and create a preventative care plan for the patient.   The roster of all physicians providing medical care to patient is listed in the Snapshot section of the chart.  Osteopenia. Taking calcium VIT D as appropriate/Osteoporosis risk reviewed.    Safety issues reviewed; Smoke and carbon monoxide detectors in the home. No firearms in the home. Wears seatbelts when driving or riding with others. No violence in the home.  They do not have excessive sun exposure.  Discussed the need for sun protection: hats, long sleeves and the use of sunscreen if there is significant sun exposure.  Patient is alert, normal appearance, oriented to person/place/and time.  Correctly identified the president of the [place] and recalls of 3/3 words. Performs simple calculations and can read correct time from watch face.  Displays appropriate judgement.  No new identified risk were noted.  No failures at ADL's or IADL's.    BMI- discussed the importance of a healthy diet, water intake and the benefits of aerobic exercise. Educational material provided.   24 hour diet recall: Regular diet  Dental- every 6 months.  Sleep patterns- Sleeps without issues through the night.   TDAP vaccine deferred per patient preference.  Follow up with insurance.  Educational material provided.  Patient Concerns: None at this time. Follow up with PCP as needed.  Exercise Activities and Dietary recommendations Current Exercise Habits: Home exercise routine, Time (Minutes): 30, Frequency (Times/Week): 5, Weekly Exercise (Minutes/Week): 150, Intensity: Mild  Goals    . Reduce Sugar Intake     Low carb diet Low  cholesterol diet       Fall Risk Fall Risk  08/18/2018 08/15/2017 08/09/2016 05/11/2015  Falls in the past year? No No Yes No  Number falls in past yr: - - 1 -  Injury with Fall? - - Yes -  Comment - - Stable and followed by PCP -  Follow up - - Falls prevention discussed;Education provided -   Depression Screen PHQ 2/9 Scores 08/18/2018 08/15/2017 08/09/2016 05/11/2015  PHQ - 2 Score 0 0 0 2  PHQ- 9 Score - 0 - 3     Cognitive Function MMSE - Mini Mental State Exam 08/18/2018 08/15/2017 08/09/2016  Orientation to time 5 5 5   Orientation to Place 5 5 5   Registration 3 3 3   Attention/ Calculation 5 5 5   Recall 3 3 3   Language- name 2 objects 2 2 2   Language- repeat 1 1 1   Language- follow 3 step command 3 3 3   Language- read & follow direction 1 1 1   Write a sentence 1 1 1   Copy design 1 1 1   Total score 30 30 30         Immunization History  Administered Date(s) Administered  . Influenza-Unspecified 01/24/2016, 08/16/2016  . Pneumococcal Conjugate-13 05/21/2014  . Pneumococcal Polysaccharide-23 08/09/2016  . Zoster Recombinat (Shingrix) 03/22/2017   Screening Tests Health Maintenance  Topic Date Due  . OPHTHALMOLOGY EXAM  01/31/1951  . URINE MICROALBUMIN  01/31/1952  . TETANUS/TDAP  01/30/1961  . DEXA SCAN  01/30/2007  . INFLUENZA VACCINE  07/10/2018  . HEMOGLOBIN A1C  11/06/2018  . FOOT EXAM  12/12/2018  . PNA vac Low Risk Adult  Completed      Plan:   End of life planning; Advance aging; Advanced directives discussed. Copy of current HCPOA/Living Will requested upon completion.     Keep all routine appointments.    Complete Dexa Scan as directed.  I have personally reviewed and noted the following in the patient's chart:   . Medical and social history . Use of alcohol, tobacco or illicit drugs  . Current medications and supplements . Functional ability and status . Nutritional status . Physical activity . Advanced directives . List of other  physicians . Hospitalizations, surgeries, and ER visits in previous 12 months . Vitals . Screenings to include cognitive, depression, and falls . Referrals and appointments  In addition, I have reviewed and discussed with patient certain preventive protocols, quality metrics, and best practice recommendations. A written personalized care plan for preventive services as well as general preventive health recommendations were provided to patient.     Varney Biles, LPN  06/15/1164    Agree with plan. Mable Paris, NP

## 2018-08-18 NOTE — Patient Instructions (Addendum)
  Pamela Norman , Thank you for taking time to come for your Medicare Wellness Visit. I appreciate your ongoing commitment to your health goals. Please review the following plan we discussed and let me know if I can assist you in the future.   Keep all routine appointments.   These are the goals we discussed: Goals    . Reduce Sugar Intake     Low carb diet Low cholesterol diet       This is a list of the screening recommended for you and due dates:  Health Maintenance  Topic Date Due  . Eye exam for diabetics  01/31/1951  . Urine Protein Check  01/31/1952  . Tetanus Vaccine  01/30/1961  . DEXA scan (bone density measurement)  01/30/2007  . Flu Shot  07/10/2018  . Hemoglobin A1C  11/06/2018  . Complete foot exam   12/12/2018  . Pneumonia vaccines  Completed

## 2018-08-19 DIAGNOSIS — Z780 Asymptomatic menopausal state: Secondary | ICD-10-CM | POA: Diagnosis not present

## 2018-08-19 DIAGNOSIS — Z1231 Encounter for screening mammogram for malignant neoplasm of breast: Secondary | ICD-10-CM | POA: Diagnosis not present

## 2018-08-19 DIAGNOSIS — M85852 Other specified disorders of bone density and structure, left thigh: Secondary | ICD-10-CM | POA: Diagnosis not present

## 2018-09-10 DIAGNOSIS — F060 Psychotic disorder with hallucinations due to known physiological condition: Secondary | ICD-10-CM | POA: Diagnosis not present

## 2018-09-17 ENCOUNTER — Inpatient Hospital Stay: Payer: Medicare Other | Attending: Obstetrics and Gynecology | Admitting: Obstetrics and Gynecology

## 2018-09-17 ENCOUNTER — Encounter: Payer: Self-pay | Admitting: Obstetrics and Gynecology

## 2018-09-17 VITALS — BP 122/81 | HR 94 | Temp 98.4°F | Resp 18 | Ht 66.0 in | Wt 172.3 lb

## 2018-09-17 DIAGNOSIS — Z90722 Acquired absence of ovaries, bilateral: Secondary | ICD-10-CM

## 2018-09-17 DIAGNOSIS — Z8542 Personal history of malignant neoplasm of other parts of uterus: Secondary | ICD-10-CM | POA: Diagnosis not present

## 2018-09-17 DIAGNOSIS — C541 Malignant neoplasm of endometrium: Secondary | ICD-10-CM

## 2018-09-17 DIAGNOSIS — Z90710 Acquired absence of both cervix and uterus: Secondary | ICD-10-CM | POA: Insufficient documentation

## 2018-09-17 DIAGNOSIS — Z08 Encounter for follow-up examination after completed treatment for malignant neoplasm: Secondary | ICD-10-CM | POA: Insufficient documentation

## 2018-09-17 DIAGNOSIS — Z923 Personal history of irradiation: Secondary | ICD-10-CM | POA: Diagnosis not present

## 2018-09-17 DIAGNOSIS — Z87891 Personal history of nicotine dependence: Secondary | ICD-10-CM | POA: Insufficient documentation

## 2018-09-17 NOTE — Progress Notes (Signed)
No gyn concerns 

## 2018-09-17 NOTE — Progress Notes (Signed)
Gynecologic Oncology Interval Note  Referring Provider: Dr. Hassell Done Defrancesco  Chief Concern: Endometrial cancer surveillance  Subjective:  Pamela Norman is a 77 y.o. female with stage IA grade 1 endometrial cancer.    She returns to clinic today for routine 6 month follow up. She denies specific complaints today and reports feeling well.   HPI Please refer to prior notes for complete details. On 03/30/15 she underwent total laparoscopic hysterectomy, bilateral salpingo-oophorectomy, pelvic sentinel lymph node mapping, and repair of vaginal lacerations caused by removal of the uterus transvaginally.  Her final pathology as noted below.  Sentinel nodes were not identified and node dissection not done since frozen showed grade 1 tumor invading less than 50% (4/9 mm).  Since final path showed LVSI she was referred to Dr. Baruch Gouty for vaginal brachytherapy. She tolerated 6 HDR treatments well.      Surgical Procedure  CASE: [id number]  PATIENT: Pamela Norman  Surgical Pathology Report      SPECIMEN SUBMITTED:  A. Uterus, cervix, and bilateral tubes and ovaries   CLINICAL HISTORY:  None provided   PRE-OPERATIVE DIAGNOSIS:  grade 1 endometrioid endometrial cancer   POST-OPERATIVE DIAGNOSIS:  Same/ total laparoscopic hysterectomy, bilateral salpingo-oophorectomy      DIAGNOSIS:  A. UTERUS; HYSTERECTOMY:  - ENDOMETRIOID CARCINOMA WITH SQUAMOUS DIFFERENTIATION, GRADE 1,  INVADING LESS THAN ONE HALF OF MYOMETRIUM.  - CERVIX NOT INVOLVED.  - INTRAMURAL LEIOMYOMA.   RIGHT AND LEFT FALLOPIAN TUBES AND OVARIES; SALPINGO-OOPHORECTOMY:  - NOT INVOLVED.  - BENIGN SIMPLE CYST OF RIGHT OVARY, 3.2 CM.   Comment:  The cervix was received in two detached pieces. The corpus was received  intact.   ENDOMETRIUM: Hysterectomy, With or Without Other Organs or Tissues  Endometrium, Hysterectomy, With or Without Other Organs or Tissues  Cancer Case Summary  Specimen: Uterine corpus   SPECIMEN  Procedure  Simple hysterectomy  Additional Procedures:  Bilateral salpingo-oophorectomy  Lymph Node Sampling:   Not performed  Specimen Integrity: Intact hysterectomy specimen  TUMOR  Histologic Type:  Endometrioid adenocarcinoma, variant (specify)  with squamous differentiation  Histologic Grade:  FIGO grade 1  EXTENT  Tumor Size:  Greatest dimension (cm)  3.5cm  Myometrial Invasion:   Present  Depth of Myometrial Invasion: Specify depth of invasion (mm)  25mm  Myometrial Thickness (mm):  21mm  Involvement of Cervix:  Not involved  Other Organs Submitted: Right ovary  Not involved  Left ovary  Not involved  Right fallopian tube  Not involved  Left fallopian tube  Not involved  ACCESSORY FINDINGS  Lymph-Vascular Invasion: Present  STAGE (pTNM [FIGO])  Primary Tumor (pT):  pT1a [IA]: Tumor limited to endometrium or invades less than one-half  of the myometrium  Regional Lymph Nodes (pN)  pNX: Cannot be assessed  Pelvic Lymph Nodes: No pelvic nodes submitted or found  Para-aortic Lymph Nodes: No para-aortic nodes submitted or found  Distant Metastasis (pM): Not applicable            Cytology: DIAGNOSIS:  A. PELVIC WASHINGS:  - NEGATIVE FOR MALIGNANCY.    Problem List: Patient Active Problem List   Diagnosis Date Noted  . Skin lesions 12/12/2017  . Elevated blood pressure reading 12/12/2017  . Diabetes mellitus without complication ([place]) 53/61/4431  . Psychosis ([place]) 09/17/2016  . Osteopenia 09/17/2016  . Vitamin D deficiency 09/17/2016  . History of endometrial cancer 05/04/2015  . Hyperlipidemia 05/21/2014    Past Medical History: Past Medical History:  Diagnosis Date  . Cataract   .  Endometrial cancer ([place]) 2016   Grade 1,total laparoscopic hysterectomy  . History of colonoscopy 2014   within normal limits  . History of ovarian cyst 01/2015  . Hyperlipidemia   . Rectal prolapse   . Squamous cell carcinoma    face     Past Surgical History: Past Surgical History:  Procedure Laterality Date  . ABDOMINAL HYSTERECTOMY    . APPENDECTOMY    . BILATERAL SALPINGOOPHORECTOMY  03/30/15  . CATARACT EXTRACTION W/PHACO Right 02/06/2016   Procedure: CATARACT EXTRACTION PHACO AND INTRAOCULAR LENS PLACEMENT (IOC);  Surgeon: Estill Cotta, MD;  Location: [facility] ORS;  Service: Ophthalmology;  Laterality: Right;  [place] 01:39 AP% 24.9 CDE 47.32 fluid pack lot # [id number]  . CATARACT EXTRACTION W/PHACO Left 03/05/2016   Procedure: CATARACT EXTRACTION PHACO AND INTRAOCULAR LENS PLACEMENT ([place]);  Surgeon: Estill Cotta, MD;  Location: [facility] ORS;  Service: Ophthalmology;  Laterality: Left;  [place] 01:02 AP% 23.2 CDE 26.73 fluid pack lot # [id number]  . COMBINED HYSTEROSCOPY DIAGNOSTIC / D&C  2016  . DILATION AND CURETTAGE OF UTERUS    . dnc    . LAPAROSCOPIC HYSTERECTOMY  03/30/15  . NASAL SINUS SURGERY    . SKIN CANCER EXCISION     UPPER LIP  . TONSILECTOMY, ADENOIDECTOMY, BILATERAL MYRINGOTOMY AND TUBES    . TONSILLECTOMY      OB History:  OB History  Gravida Para Term Preterm AB Living  0 0 0 0 0 0  SAB TAB Ectopic Multiple Live Births  0 0 0 0    Obstetric Comments  Age at first menarche-age 37-13    Family History: Family History  Problem Relation Age of Onset  . Colon cancer Father        older age onset  . COPD Father   . COPD Mother   . Breast cancer Neg Hx   . Ovarian cancer Neg Hx   . Diabetes Neg Hx   . Heart disease Neg Hx     Social History: Social History   Socioeconomic History  . Marital status: Single    Spouse name: Not on file  . Number of children: 0  . Years of education: PhD  . Highest education level: Not on file  Occupational History  . Occupation: Retired  Scientific laboratory technician  . Financial resource strain: Not hard at all  . Food insecurity:    Worry: Not on file    Inability: Not on file  . Transportation needs:    Medical: No    Non-medical: No  Tobacco Use  . Smoking  status: Former Smoker    Packs/day: 0.25    Years: 20.00    Pack years: 5.00    Last attempt to quit: 04/13/1981    Years since quitting: 37.4  . Smokeless tobacco: Never Used  Substance and Sexual Activity  . Alcohol use: No  . Drug use: No  . Sexual activity: Not Currently  Lifestyle  . Physical activity:    Days per week: 5 days    Minutes per session: 30 min  . Stress: Not at all  Relationships  . Social connections:    Talks on phone: Not on file    Gets together: Not on file    Attends religious service: Not on file    Active member of club or organization: Not on file    Attends meetings of clubs or organizations: Not on file    Relationship status: Not on file  . Intimate partner violence:  Fear of current or ex partner: Not on file    Emotionally abused: Not on file    Physically abused: Not on file    Forced sexual activity: Not on file  Other Topics Concern  . Not on file  Social History Narrative   Lives in [place]. No children.      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      1-2 cups caffeine daily.    Allergies: No Known Allergies  Current Medications: Current Outpatient Medications  Medication Sig Dispense Refill  . atorvastatin (LIPITOR) 40 MG tablet Take 1 tablet (40 mg total) by mouth daily. 90 tablet 1  . cholecalciferol (VITAMIN D) 1000 units tablet Take 1,000 Units by mouth daily.    . DULoxetine (CYMBALTA) 30 MG capsule 30 mg.     . metFORMIN (GLUCOPHAGE) 500 MG tablet Take 1 tablet (500 mg total) by mouth daily with breakfast. 90 tablet 3  . Multiple Vitamin (MULTIVITAMIN) tablet Take 1 tablet by mouth daily.    . risperiDONE (RISPERDAL) 1 MG tablet at bedtime.      No current facility-administered medications for this visit.     Review of Systems General: no complaints  HEENT: no complaints  Lungs: no complaints  Cardiac: no  complaints  GI: no complaints  GU: no complaints  Musculoskeletal: no complaints  Extremities: no complaints  Skin: no complaints  Neuro: no complaints  Endocrine: no complaints  Psych: no complaints      Objective:   Vitals:   09/17/18 1348  BP: 122/81  Pulse: 94  Resp: 18  Temp: 98.4 F (36.9 C)  TempSrc: Tympanic  Weight: 172 lb 4.8 oz (78.2 kg)  Height: 5\' 6"  (1.676 m)  Body mass index is 27.81 kg/m.  ECOG Performance Status: 0 - Asymptomatic  General appearance: alert, cooperative and appears stated age. Unaccompanied.  HEENT: ATNC Neck: normal Lungs: B CTA Heart: Normal rate and rhythm Abdomen: no palpable masses, no hernias, well healed incisions, soft, nontender, nondistended. Umbilicus normal.  Back: normal to inspection. No tenderness Extremities: no lower extremity edema Neurological exam reveals alert, oriented, normal speech, no focal findings or movement disorder noted  Pelvic: EGBUS: normal, Vagina: well healed with some mild radiation changes, no lesions, Bimanual: no masses or nodularity.  RV: confirms     Assessment:  Pamela Norman is a 77 y.o. female diagnosed with stage IA, grade 1, endometrioid adenocarcinoma of the endometrium S/P TLH/BSO in 4/16 and vaginal brachytherapy due to 4/9 mm invasion and LVSI. NED.  Body mass index is 27.81 kg/m.  Plan:   Problem List Items Addressed This Visit    None    Visit Diagnoses    Endometrial cancer ([place])    -  Primary     We will see her again in 6 months.  We discussed that she should call to report any concerning symptoms such as pain, discharge, bleeding, or other GU or GI issues.      Mellody Drown, MD

## 2018-10-31 ENCOUNTER — Other Ambulatory Visit: Payer: Self-pay | Admitting: Family Medicine

## 2018-11-10 ENCOUNTER — Encounter: Payer: Self-pay | Admitting: Family Medicine

## 2018-11-10 ENCOUNTER — Ambulatory Visit (INDEPENDENT_AMBULATORY_CARE_PROVIDER_SITE_OTHER): Payer: Medicare Other | Admitting: Family Medicine

## 2018-11-10 VITALS — BP 120/84 | HR 90 | Temp 98.0°F | Ht 66.0 in | Wt 172.6 lb

## 2018-11-10 DIAGNOSIS — E119 Type 2 diabetes mellitus without complications: Secondary | ICD-10-CM

## 2018-11-10 DIAGNOSIS — Z8659 Personal history of other mental and behavioral disorders: Secondary | ICD-10-CM

## 2018-11-10 DIAGNOSIS — E785 Hyperlipidemia, unspecified: Secondary | ICD-10-CM | POA: Diagnosis not present

## 2018-11-10 DIAGNOSIS — Z8542 Personal history of malignant neoplasm of other parts of uterus: Secondary | ICD-10-CM | POA: Diagnosis not present

## 2018-11-10 NOTE — Patient Instructions (Signed)
Nice to see you. We will check lab work today and contact you with the results. Please try to stay active.

## 2018-11-10 NOTE — Assessment & Plan Note (Signed)
Check lipid panel.  Continue Lipitor. 

## 2018-11-10 NOTE — Progress Notes (Signed)
  Tommi Rumps, MD Phone: [phone]  Pamela Norman is a 77 y.o. female who presents today for f/u.  CC: DM, HLD, history of psychosis  DIABETES Disease Monitoring: Blood Sugar ranges-not checking Polyuria/phagia/dipsia- no      Optho- due, patient is going to call Medications: Compliance- taking metformin Hypoglycemic symptoms- no  HYPERLIPIDEMIA Symptoms Chest pain on exertion:  no    Medications: Compliance- taking lipitor Right upper quadrant pain- no  Muscle aches- no Has been exercising most days of the week by doing cardio.  History of psychosis: She is following with a psychiatrist.  She reports they are working on tapering down off of her Risperdal and Cymbalta.  She notes no hallucinations.  No depression or anxiety.   Social History   Tobacco Use  Smoking Status Former Smoker  . Packs/day: 0.25  . Years: 20.00  . Pack years: 5.00  . Last attempt to quit: 04/13/1981  . Years since quitting: 37.6  Smokeless Tobacco Never Used     ROS see history of present illness  Objective  Physical Exam Vitals:   11/10/18 1412  BP: 120/84  Pulse: 90  Temp: 98 F (36.7 C)  SpO2: 97%    BP Readings from Last 3 Encounters:  11/10/18 120/84  09/17/18 122/81  08/18/18 118/72   Wt Readings from Last 3 Encounters:  11/10/18 172 lb 9.6 oz (78.3 kg)  09/17/18 172 lb 4.8 oz (78.2 kg)  08/18/18 171 lb 1.9 oz (77.6 kg)    Physical Exam  Constitutional: No distress.  Cardiovascular: Normal rate, regular rhythm and normal heart sounds.  Pulmonary/Chest: Effort normal and breath sounds normal.  Musculoskeletal: She exhibits no edema.  Neurological: She is alert.  Skin: Skin is warm and dry. She is not diaphoretic.     Assessment/Plan: Please see individual problem list.  Diabetes mellitus without complication (HCC) Check A1c.  Continue metformin.  History of psychosis History of this.  Per patient report she is working on tapering down off of her  medication through psychiatry.  Hyperlipidemia Check lipid panel.  Continue Lipitor.  History of endometrial cancer Recently saw oncology.  She will continue to follow with them.   Orders Placed This Encounter  Procedures  . Urine Microalbumin w/creat. ratio  . HgB A1c  . Lipid panel  . Basic Metabolic Panel (BMET)    No orders of the defined types were placed in this encounter.    Tommi Rumps, MD [place]

## 2018-11-10 NOTE — Assessment & Plan Note (Signed)
Check A1c.  Continue metformin. 

## 2018-11-10 NOTE — Assessment & Plan Note (Signed)
Recently saw oncology.  She will continue to follow with them.

## 2018-11-10 NOTE — Assessment & Plan Note (Signed)
History of this.  Per patient report she is working on tapering down off of her medication through psychiatry.

## 2018-11-11 LAB — LIPID PANEL
CHOLESTEROL: 131 mg/dL (ref 0–200)
HDL: 31.9 mg/dL — ABNORMAL LOW (ref >39.00)
LDL CALC: 71 mg/dL (ref 0–99)
NonHDL: 98.63
Total CHOL/HDL Ratio: 4
Triglycerides: 138 mg/dL (ref 0.0–149.0)
VLDL: 27.6 mg/dL (ref 0.0–40.0)

## 2018-11-11 LAB — BASIC METABOLIC PANEL
BUN: 22 mg/dL (ref 6–23)
CO2: 29 meq/L (ref 19–32)
Calcium: 9.4 mg/dL (ref 8.4–10.5)
Chloride: 104 mEq/L (ref 96–112)
Creatinine, Ser: 0.83 mg/dL (ref 0.40–1.20)
GFR: 70.89 mL/min (ref >60.00)
GLUCOSE: 111 mg/dL — AB (ref 70–99)
POTASSIUM: 4.3 meq/L (ref 3.5–5.1)
SODIUM: 140 meq/L (ref 135–145)

## 2018-11-11 LAB — MICROALBUMIN / CREATININE URINE RATIO
CREATININE, U: 36.8 mg/dL
Microalb Creat Ratio: 1.9 mg/g (ref 0.0–30.0)

## 2018-11-11 LAB — HEMOGLOBIN A1C: HEMOGLOBIN A1C: 6.2 % (ref 4.6–6.5)

## 2018-12-12 ENCOUNTER — Other Ambulatory Visit: Payer: Self-pay | Admitting: Family Medicine

## 2018-12-17 DIAGNOSIS — F060 Psychotic disorder with hallucinations due to known physiological condition: Secondary | ICD-10-CM | POA: Diagnosis not present

## 2019-01-30 ENCOUNTER — Other Ambulatory Visit: Payer: Self-pay | Admitting: Family Medicine

## 2019-03-13 ENCOUNTER — Telehealth: Payer: Self-pay

## 2019-03-13 NOTE — Telephone Encounter (Signed)
Called patient to advise that in response to COVID-19 Pandemic and to ensure the continuous provision of safe, quality care for our patients, communities, staff, and providers, The [facility] Oncology Service line has advised [place] to temporarily alter scheduling and appointments of our patients. We discussed that patient is currently being seen for surveillance of endometrial cancer and based on this, we recommend setting up a telehealth visit with our providers. She preferred telephone visit as an alternative which patient agreed to.  It was discussed that we would reschedule her clinic visit for a later date as she will be due for her pelvic exam. She denies any problems at this time.     Appt date as before 03-18-19 at 0930. Phone number confirmed

## 2019-03-17 ENCOUNTER — Telehealth: Payer: Self-pay | Admitting: Obstetrics and Gynecology

## 2019-03-18 ENCOUNTER — Inpatient Hospital Stay: Payer: Medicare Other | Attending: Obstetrics and Gynecology | Admitting: Obstetrics and Gynecology

## 2019-03-18 ENCOUNTER — Encounter: Payer: Self-pay | Admitting: Obstetrics and Gynecology

## 2019-03-18 DIAGNOSIS — C541 Malignant neoplasm of endometrium: Secondary | ICD-10-CM

## 2019-03-18 NOTE — Progress Notes (Signed)
Gynecologic Oncology Interval Note  Virtual Visit via Telephone Note  Referring Provider: Dr. Hassell Done Defrancesco  Chief Concern: Endometrial cancer surveillance  Subjective:  Pamela Norman is a 78 y.o. female with stage IA grade 1 endometrial cancer.     I connected with Adolphus Birchwood on 03/18/19 at  9:30 AM EDT by telephone and verified that I am speaking with the correct person using two identifiers.   I discussed the limitations, risks, security and privacy concerns of performing an evaluation and management service by telephone and the availability of in person appointments. I also discussed with the patient that there may be a patient responsible charge related to this service. The patient expressed understanding and agreed to proceed.  Spoke to her on the phone instead of seeing her in clinic today due to COVID-19 pandemic. Last seen about 6 months ago. She denies specific complaints today and reports feeling well. No bleeding or pain.    HPI Please refer to prior notes for complete details. On 03/30/15 she underwent total laparoscopic hysterectomy, bilateral salpingo-oophorectomy, pelvic sentinel lymph node mapping, and repair of vaginal lacerations caused by removal of the uterus transvaginally.  Her final pathology as noted below.  Sentinel nodes were not identified and node dissection not done since frozen showed grade 1 tumor invading less than 50% (4/9 mm).  Since final path showed LVSI she was referred to Dr. Baruch Gouty for vaginal brachytherapy. She tolerated 6 HDR treatments well.      Surgical Procedure  CASE: [id number]  PATIENT: Pamela Norman  Surgical Pathology Report      SPECIMEN SUBMITTED:  A. Uterus, cervix, and bilateral tubes and ovaries   CLINICAL HISTORY:  None provided   PRE-OPERATIVE DIAGNOSIS:  grade 1 endometrioid endometrial cancer   POST-OPERATIVE DIAGNOSIS:  Same/ total laparoscopic hysterectomy, bilateral salpingo-oophorectomy       DIAGNOSIS:  A. UTERUS; HYSTERECTOMY:  - ENDOMETRIOID CARCINOMA WITH SQUAMOUS DIFFERENTIATION, GRADE 1,  INVADING LESS THAN ONE HALF OF MYOMETRIUM.  - CERVIX NOT INVOLVED.  - INTRAMURAL LEIOMYOMA.   RIGHT AND LEFT FALLOPIAN TUBES AND OVARIES; SALPINGO-OOPHORECTOMY:  - NOT INVOLVED.  - BENIGN SIMPLE CYST OF RIGHT OVARY, 3.2 CM.   Comment:  The cervix was received in two detached pieces. The corpus was received  intact.   ENDOMETRIUM: Hysterectomy, With or Without Other Organs or Tissues  Endometrium, Hysterectomy, With or Without Other Organs or Tissues  Cancer Case Summary  Specimen: Uterine corpus  SPECIMEN  Procedure  Simple hysterectomy  Additional Procedures:  Bilateral salpingo-oophorectomy  Lymph Node Sampling:   Not performed  Specimen Integrity: Intact hysterectomy specimen  TUMOR  Histologic Type:  Endometrioid adenocarcinoma, variant (specify)  with squamous differentiation  Histologic Grade:  FIGO grade 1  EXTENT  Tumor Size:  Greatest dimension (cm)  3.5cm  Myometrial Invasion:   Present  Depth of Myometrial Invasion: Specify depth of invasion (mm)  53mm  Myometrial Thickness (mm):  51mm  Involvement of Cervix:  Not involved  Other Organs Submitted: Right ovary  Not involved  Left ovary  Not involved  Right fallopian tube  Not involved  Left fallopian tube  Not involved  ACCESSORY FINDINGS  Lymph-Vascular Invasion: Present  STAGE (pTNM [FIGO])  Primary Tumor (pT):  pT1a [IA]: Tumor limited to endometrium or invades less than one-half  of the myometrium  Regional Lymph Nodes (pN)  pNX: Cannot be assessed  Pelvic Lymph Nodes: No pelvic nodes submitted or found  Para-aortic Lymph Nodes: No para-aortic nodes submitted or  found  Distant Metastasis (pM): Not applicable            Cytology: DIAGNOSIS:  A. PELVIC WASHINGS:  - NEGATIVE FOR MALIGNANCY.    Problem List: Patient Active Problem List   Diagnosis Date Noted  .  Skin lesions 12/12/2017  . Elevated blood pressure reading 12/12/2017  . Diabetes mellitus without complication ([place]) 16/60/6301  . History of psychosis 09/17/2016  . Osteopenia 09/17/2016  . Vitamin D deficiency 09/17/2016  . History of endometrial cancer 05/04/2015  . Hyperlipidemia 05/21/2014    Past Medical History: Past Medical History:  Diagnosis Date  . Cataract   . Endometrial cancer ([place]) 2016   Grade 1,total laparoscopic hysterectomy  . History of colonoscopy 2014   within normal limits  . History of ovarian cyst 01/2015  . Hyperlipidemia   . Rectal prolapse   . Squamous cell carcinoma    face    Past Surgical History: Past Surgical History:  Procedure Laterality Date  . ABDOMINAL HYSTERECTOMY    . APPENDECTOMY    . BILATERAL SALPINGOOPHORECTOMY  03/30/15  . CATARACT EXTRACTION W/PHACO Right 02/06/2016   Procedure: CATARACT EXTRACTION PHACO AND INTRAOCULAR LENS PLACEMENT (IOC);  Surgeon: Estill Cotta, MD;  Location: [facility] ORS;  Service: Ophthalmology;  Laterality: Right;  [place] 01:39 AP% 24.9 CDE 47.32 fluid pack lot # [id number]  . CATARACT EXTRACTION W/PHACO Left 03/05/2016   Procedure: CATARACT EXTRACTION PHACO AND INTRAOCULAR LENS PLACEMENT ([place]);  Surgeon: Estill Cotta, MD;  Location: [facility] ORS;  Service: Ophthalmology;  Laterality: Left;  [place] 01:02 AP% 23.2 CDE 26.73 fluid pack lot # [id number]  . COMBINED HYSTEROSCOPY DIAGNOSTIC / D&C  2016  . DILATION AND CURETTAGE OF UTERUS    . dnc    . LAPAROSCOPIC HYSTERECTOMY  03/30/15  . NASAL SINUS SURGERY    . SKIN CANCER EXCISION     UPPER LIP  . TONSILECTOMY, ADENOIDECTOMY, BILATERAL MYRINGOTOMY AND TUBES    . TONSILLECTOMY      OB History:  OB History  Gravida Para Term Preterm AB Living  0 0 0 0 0 0  SAB TAB Ectopic Multiple Live Births  0 0 0 0    Obstetric Comments  Age at first menarche-age [age]    Family History: Family History  Problem Relation Age of Onset  . Colon cancer Father         older age onset  . COPD Father   . COPD Mother   . Breast cancer Neg Hx   . Ovarian cancer Neg Hx   . Diabetes Neg Hx   . Heart disease Neg Hx     Social History: Social History   Socioeconomic History  . Marital status: Single    Spouse name: Not on file  . Number of children: 0  . Years of education: PhD  . Highest education level: Not on file  Occupational History  . Occupation: Retired  Scientific laboratory technician  . Financial resource strain: Not hard at all  . Food insecurity:    Worry: Not on file    Inability: Not on file  . Transportation needs:    Medical: No    Non-medical: No  Tobacco Use  . Smoking status: Former Smoker    Packs/day: 0.25    Years: 20.00    Pack years: 5.00    Last attempt to quit: 04/13/1981    Years since quitting: 37.9  . Smokeless tobacco: Never Used  Substance and Sexual Activity  . Alcohol use: No  .  Drug use: No  . Sexual activity: Not Currently  Lifestyle  . Physical activity:    Days per week: 5 days    Minutes per session: 30 min  . Stress: Not at all  Relationships  . Social connections:    Talks on phone: Not on file    Gets together: Not on file    Attends religious service: Not on file    Active member of club or organization: Not on file    Attends meetings of clubs or organizations: Not on file    Relationship status: Not on file  . Intimate partner violence:    Fear of current or ex partner: Not on file    Emotionally abused: Not on file    Physically abused: Not on file    Forced sexual activity: Not on file  Other Topics Concern  . Not on file  Social History Narrative   Lives in [place]. No children.      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      1-2 cups caffeine daily.    Allergies: No Known Allergies  Current Medications: Current Outpatient Medications  Medication Sig Dispense Refill  . atorvastatin  (LIPITOR) 40 MG tablet TAKE 1 TABLET(40 MG) BY MOUTH DAILY 90 tablet 1  . cholecalciferol (VITAMIN D) 1000 units tablet Take 1,000 Units by mouth daily.    . DULoxetine (CYMBALTA) 30 MG capsule 30 mg.     . metFORMIN (GLUCOPHAGE) 500 MG tablet TAKE 1 TABLET(500 MG) BY MOUTH DAILY WITH BREAKFAST 90 tablet 0  . Multiple Vitamin (MULTIVITAMIN) tablet Take 1 tablet by mouth daily.    . risperiDONE (RISPERDAL) 1 MG tablet at bedtime.      No current facility-administered medications for this visit.     Review of Systems General: no complaints  HEENT: no complaints  Lungs: no complaints  Cardiac: no complaints  GI: no complaints  GU: no complaints  Musculoskeletal: no complaints  Extremities: no complaints  Skin: no complaints  Neuro: no complaints  Endocrine: no complaints  Psych: no complaints      Objective:   There were no vitals filed for this visit.There is no height or weight on file to calculate BMI.  ECOG Performance Status: 0 - Asymptomatic  Physical exam below from 6 months ago.    General appearance: alert, cooperative and appears stated age. Unaccompanied.  HEENT: ATNC Neck: normal Lungs: B CTA Heart: Normal rate and rhythm Abdomen: no palpable masses, no hernias, well healed incisions, soft, nontender, nondistended. Umbilicus normal.  Back: normal to inspection. No tenderness Extremities: no lower extremity edema Neurological exam reveals alert, oriented, normal speech, no focal findings or movement disorder noted  Pelvic: EGBUS: normal, Vagina: well healed with some mild radiation changes, no lesions, Bimanual: no masses or nodularity.  RV: confirms.    Assessment:  ASUKA DUSSEAU is a 78 y.o. female diagnosed with stage IA, grade 1, endometrioid adenocarcinoma of the endometrium S/P TLH/BSO in 4/16 and vaginal brachytherapy due to 4/9 mm invasion and LVSI. NED.  Plan:   Problem List Items Addressed This Visit    None    Visit Diagnoses    Endometrial  cancer ([place])    -  Primary     We discussed COVID-19 precautions.  She lives alone and is sheltering at home appropriately.  We  also discussed general [facility] recommendations for identifying COVID-19 risk and what to do if low risk such as handwashing, social distancing, coughing or sneezing into shirt or elbow, and staying at home. Advised patient that if she develops fever, cough, shortness of breath, to contact their healthcare provider before going to clinic and as always contact 911 for medical emergencies.   We will see her in 3 months.  We discussed that she should call to report any concerning symptoms such as pain, discharge, bleeding, or other GU or GI issues.    I discussed the assessment and treatment plan with the patient. The patient was provided an opportunity to ask questions and all were answered. The patient agreed with the plan and demonstrated an understanding of the instructions.   The patient was advised to call back or seek an in-person evaluation if the symptoms worsen or if the condition fails to improve as anticipated.  I provided 5 minutes of non-face-to-face time during this encounter.    Mellody Drown, MD

## 2019-03-20 DIAGNOSIS — F060 Psychotic disorder with hallucinations due to known physiological condition: Secondary | ICD-10-CM | POA: Diagnosis not present

## 2019-03-22 ENCOUNTER — Emergency Department: Payer: Medicare Other

## 2019-03-22 ENCOUNTER — Other Ambulatory Visit: Payer: Self-pay

## 2019-03-22 ENCOUNTER — Emergency Department
Admission: EM | Admit: 2019-03-22 | Discharge: 2019-03-22 | Disposition: A | Discharge status: Home / Self Care | Payer: Medicare Other | Attending: Student in an Organized Health Care Education/Training Program | Admitting: Student in an Organized Health Care Education/Training Program

## 2019-03-22 DIAGNOSIS — Z8542 Personal history of malignant neoplasm of other parts of uterus: Secondary | ICD-10-CM | POA: Diagnosis not present

## 2019-03-22 DIAGNOSIS — S199XXA Unspecified injury of neck, initial encounter: Secondary | ICD-10-CM | POA: Diagnosis present

## 2019-03-22 DIAGNOSIS — X509XXA Other and unspecified overexertion or strenuous movements or postures, initial encounter: Secondary | ICD-10-CM | POA: Insufficient documentation

## 2019-03-22 DIAGNOSIS — R4182 Altered mental status, unspecified: Secondary | ICD-10-CM | POA: Insufficient documentation

## 2019-03-22 DIAGNOSIS — Z79899 Other long term (current) drug therapy: Secondary | ICD-10-CM | POA: Insufficient documentation

## 2019-03-22 DIAGNOSIS — Y999 Unspecified external cause status: Secondary | ICD-10-CM | POA: Insufficient documentation

## 2019-03-22 DIAGNOSIS — Y929 Unspecified place or not applicable: Secondary | ICD-10-CM | POA: Insufficient documentation

## 2019-03-22 DIAGNOSIS — M47812 Spondylosis without myelopathy or radiculopathy, cervical region: Secondary | ICD-10-CM | POA: Diagnosis not present

## 2019-03-22 DIAGNOSIS — S161XXA Strain of muscle, fascia and tendon at neck level, initial encounter: Secondary | ICD-10-CM | POA: Diagnosis not present

## 2019-03-22 DIAGNOSIS — M4802 Spinal stenosis, cervical region: Secondary | ICD-10-CM

## 2019-03-22 DIAGNOSIS — Z7984 Long term (current) use of oral hypoglycemic drugs: Secondary | ICD-10-CM | POA: Diagnosis not present

## 2019-03-22 DIAGNOSIS — Z85828 Personal history of other malignant neoplasm of skin: Secondary | ICD-10-CM | POA: Diagnosis not present

## 2019-03-22 DIAGNOSIS — Y939 Activity, unspecified: Secondary | ICD-10-CM | POA: Diagnosis not present

## 2019-03-22 DIAGNOSIS — Z87891 Personal history of nicotine dependence: Secondary | ICD-10-CM | POA: Insufficient documentation

## 2019-03-22 DIAGNOSIS — M542 Cervicalgia: Secondary | ICD-10-CM | POA: Diagnosis not present

## 2019-03-22 DIAGNOSIS — R404 Transient alteration of awareness: Secondary | ICD-10-CM | POA: Diagnosis not present

## 2019-03-22 LAB — CBC
HCT: 43.1 % (ref 36.0–46.0)
Hemoglobin: 13.9 g/dL (ref 12.0–15.0)
MCH: 30.2 pg (ref 26.0–34.0)
MCHC: 32.3 g/dL (ref 30.0–36.0)
MCV: 93.7 fL (ref 80.0–100.0)
Platelets: 282 10*3/uL (ref 150–400)
RBC: 4.6 MIL/uL (ref 3.87–5.11)
RDW: 13.2 % (ref 11.5–15.5)
WBC: 7.8 10*3/uL (ref 4.0–10.5)
nRBC: 0 % (ref 0.0–0.2)

## 2019-03-22 LAB — COMPREHENSIVE METABOLIC PANEL
ALT: 24 U/L (ref 0–44)
AST: 22 U/L (ref 15–41)
Albumin: 4.4 g/dL (ref 3.5–5.0)
Alkaline Phosphatase: 77 U/L (ref 38–126)
Anion gap: 9 (ref 5–15)
BUN: 15 mg/dL (ref 8–23)
CO2: 25 mmol/L (ref 22–32)
Calcium: 9.2 mg/dL (ref 8.9–10.3)
Chloride: 106 mmol/L (ref 98–111)
Creatinine, Ser: 0.69 mg/dL (ref 0.44–1.00)
GFR calc Af Amer: >60 mL/min (ref >60)
GFR calc non Af Amer: >60 mL/min (ref >60)
Glucose, Bld: 133 mg/dL — ABNORMAL HIGH (ref 70–99)
Potassium: 3.8 mmol/L (ref 3.5–5.1)
Sodium: 140 mmol/L (ref 135–145)
Total Bilirubin: 0.8 mg/dL (ref 0.3–1.2)
Total Protein: 7.7 g/dL (ref 6.5–8.1)

## 2019-03-22 LAB — SALICYLATE LEVEL: Salicylate Lvl: <7 mg/dL (ref 2.8–30.0)

## 2019-03-22 LAB — ACETAMINOPHEN LEVEL: Acetaminophen (Tylenol), Serum: <10 ug/mL — ABNORMAL LOW (ref 10–30)

## 2019-03-22 LAB — CK: Total CK: 231 U/L (ref 38–234)

## 2019-03-22 LAB — ETHANOL: Alcohol, Ethyl (B): <10 mg/dL (ref <10)

## 2019-03-22 MED ORDER — LORAZEPAM 0.5 MG PO TABS
0.5000 mg | ORAL_TABLET | Freq: Once | ORAL | Status: AC
  Administered 2019-03-22: 0.5 mg via ORAL
  Filled 2019-03-22: qty 1

## 2019-03-22 NOTE — ED Triage Notes (Signed)
Pt states "i'm having some terrible physical problems and a small part of it is mental." Pt states "my head and neck and my spine are out of alignment and if I pull it scares me." pt states she went to a mental health facility last week. Pt is talking a lot. Pt keeps mentioning a routine and washing her hair. Pt states when she has a routine she doesn't feel like this or has to take her medication. "the problem is being at home all the time." "maybe I got myself into this but it's terribly physical." while talking pt keeps getting up and moving around room to show this RN the physical aspect of it. Pt keeps apologizing tothis RN. Pt states "it's just worse today and it scares me" pt states the pulling on her head and neck scare her. No abnormality noted in triage of pt head, neck, or spine. Pt is moving around without difficulty. Pt states "if I thought it was just mental I could fix it but it's physical"   Denies SI or HI.   Pt ambulatory.

## 2019-03-22 NOTE — ED Notes (Signed)
Patient called friend for ride home.

## 2019-03-22 NOTE — ED Notes (Signed)
Patient taken to CT scan prior to ATivan administration

## 2019-03-22 NOTE — ED Notes (Signed)
Patient is calm and cooperative. Patient is lying on stretcher after independently removing sweater and putting on gown. Patient was given a warm blanket. Patient states she did have an episode some years ago when she was depressed from having cancer and hospitalized at that point. Patient does say she feels her body is not in "alignment". Patient has to be directed to stay on the subject.

## 2019-03-22 NOTE — ED Provider Notes (Signed)
[facility] Emergency Department Provider Note    First MD Initiated Contact with Patient 03/22/19 1523     (approximate)  I have reviewed the triage vital signs and the nursing notes.   HISTORY  Chief Complaint Psychiatric Evaluation    HPI Pamela Norman is a 78 y.o. female presents the ER for evaluation of "pulling pain "all over her body.  Patient is very talkative.  She is pleasant but describing several days of neck discomfort and sensation pulling from her neck down to her legs.  Denies any numbness tingling or weakness.  She has been able to ambulate.  Denies any heaviness.  No chest pain or shortness of breath..  Intermittently talking about how she is been combing her hair to try and alleviate the pulling.  States that it hurts from her neck down to her legs.  States that she is been at home alone.  Does not appear to be overtly hallucinating.  Denies any numbness or tingling.  No fevers nausea or vomiting.  No reported history of dementia, depression and anxiety or psychiatric illness.  Denies chest pain or sob.    Past Medical History:  Diagnosis Date   Cataract    Endometrial cancer ([place]) 2016   Grade 1,total laparoscopic hysterectomy   History of colonoscopy 2014   within normal limits   History of ovarian cyst 01/2015   Hyperlipidemia    Rectal prolapse    Squamous cell carcinoma    face   Family History  Problem Relation Age of Onset   Colon cancer Father        older age onset   COPD Father    COPD Mother    Breast cancer Neg Hx    Ovarian cancer Neg Hx    Diabetes Neg Hx    Heart disease Neg Hx    Past Surgical History:  Procedure Laterality Date   ABDOMINAL HYSTERECTOMY     APPENDECTOMY     BILATERAL SALPINGOOPHORECTOMY  03/30/15   CATARACT EXTRACTION W/PHACO Right 02/06/2016   Procedure: CATARACT EXTRACTION PHACO AND INTRAOCULAR LENS PLACEMENT ([place]);  Surgeon: Estill Cotta, MD;  Location: [facility] ORS;   Service: Ophthalmology;  Laterality: Right;  [place] 01:39 AP% 24.9 CDE 47.32 fluid pack lot # [id number]   CATARACT EXTRACTION W/PHACO Left 03/05/2016   Procedure: CATARACT EXTRACTION PHACO AND INTRAOCULAR LENS PLACEMENT (IOC);  Surgeon: Estill Cotta, MD;  Location: [facility] ORS;  Service: Ophthalmology;  Laterality: Left;  [place] 01:02 AP% 23.2 CDE 26.73 fluid pack lot # [id number]   COMBINED HYSTEROSCOPY DIAGNOSTIC / D&C  2016   DILATION AND CURETTAGE OF UTERUS     dnc     LAPAROSCOPIC HYSTERECTOMY  03/30/15   NASAL SINUS SURGERY     SKIN CANCER EXCISION     UPPER LIP   TONSILECTOMY, ADENOIDECTOMY, BILATERAL MYRINGOTOMY AND TUBES     TONSILLECTOMY     Patient Active Problem List   Diagnosis Date Noted   Skin lesions 12/12/2017   Elevated blood pressure reading 12/12/2017   Diabetes mellitus without complication ([place]) 35/57/3220   History of psychosis 09/17/2016   Osteopenia 09/17/2016   Vitamin D deficiency 09/17/2016   History of endometrial cancer 05/04/2015   Hyperlipidemia 05/21/2014      Prior to Admission medications   Medication Sig Start Date End Date Taking? Authorizing Provider  atorvastatin (LIPITOR) 40 MG tablet TAKE 1 TABLET(40 MG) BY MOUTH DAILY 12/12/18   Leone Haven, MD  cholecalciferol (VITAMIN  D) 1000 units tablet Take 1,000 Units by mouth daily.    [provider]  DULoxetine (CYMBALTA) 30 MG capsule 30 mg.  10/28/16   [provider]  metFORMIN (GLUCOPHAGE) 500 MG tablet TAKE 1 TABLET(500 MG) BY MOUTH DAILY WITH BREAKFAST 01/30/19   Leone Haven, MD  Multiple Vitamin (MULTIVITAMIN) tablet Take 1 tablet by mouth daily.    [provider]  risperiDONE (RISPERDAL) 1 MG tablet at bedtime.  10/12/15   [provider]    Allergies Patient has no known allergies.    Social History Social History   Tobacco Use   Smoking status: Former Smoker    Packs/day: 0.25    Years: 20.00    Pack years: 5.00     Last attempt to quit: 04/13/1981    Years since quitting: 37.9   Smokeless tobacco: Never Used  Substance Use Topics   Alcohol use: No   Drug use: No    Review of Systems Patient denies headaches, rhinorrhea, blurry vision, numbness, shortness of breath, chest pain, edema, cough, abdominal pain, nausea, vomiting, diarrhea, dysuria, fevers, rashes or hallucinations unless otherwise stated above in HPI. ____________________________________________   PHYSICAL EXAM:  VITAL SIGNS: Vitals:   03/22/19 1730 03/22/19 1833  BP: (!) 160/89 (!) 142/94  Pulse:  74  Resp:  18  Temp:    SpO2:  99%    Constitutional: Alert and oriented x 3.   Eyes: Conjunctivae are normal.  Head: Atraumatic. Nose: No congestion/rhinnorhea. Mouth/Throat: Mucous membranes are moist.   Neck: No stridor. Painless ROM.  Cardiovascular: Normal rate, regular rhythm. Grossly normal heart sounds.  Good peripheral circulation. Respiratory: Normal respiratory effort.  No retractions. Lungs CTAB. Gastrointestinal: Soft and nontender. No distention. No abdominal bruits. No CVA tenderness. Genitourinary:  Musculoskeletal: No lower extremity tenderness nor edema.  No joint effusions. Neurologic:  CN- intact.  No facial droop, Normal FNF.  Normal heel to shin.  Sensation intact bilaterally. Normal speech and language. No gross focal neurologic deficits are appreciated. No gait instability. Skin:  Skin is warm, dry and intact. No rash noted. Psychiatric: Speech and behavior are normal.  Patient's speech is a little bit tangential in nature and at times difficult to follow but her history does make sense even if a bit hyper-loquacious  ____________________________________________   LABS (all labs ordered are listed, but only abnormal results are displayed)  Results for orders placed or performed during the hospital encounter of 03/22/19 (from the past 24 hour(s))  Comprehensive metabolic panel     Status: Abnormal    Collection Time: 03/22/19  2:53 PM  Result Value Ref Range   Sodium 140 135 - 145 mmol/L   Potassium 3.8 3.5 - 5.1 mmol/L   Chloride 106 98 - 111 mmol/L   CO2 25 22 - 32 mmol/L   Glucose, Bld 133 (H) 70 - 99 mg/dL   BUN 15 8 - 23 mg/dL   Creatinine, Ser 0.69 0.44 - 1.00 mg/dL   Calcium 9.2 8.9 - 10.3 mg/dL   Total Protein 7.7 6.5 - 8.1 g/dL   Albumin 4.4 3.5 - 5.0 g/dL   AST 22 15 - 41 U/L   ALT 24 0 - 44 U/L   Alkaline Phosphatase 77 38 - 126 U/L   Total Bilirubin 0.8 0.3 - 1.2 mg/dL   GFR calc non Af Amer >60 >60 mL/min   GFR calc Af Amer >60 >60 mL/min   Anion gap 9 5 - 15  Ethanol  Status: None   Collection Time: 03/22/19  2:53 PM  Result Value Ref Range   Alcohol, Ethyl (B) <75 <64 mg/dL  Salicylate level     Status: None   Collection Time: 03/22/19  2:53 PM  Result Value Ref Range   Salicylate Lvl <3.3 2.8 - 30.0 mg/dL  Acetaminophen level     Status: Abnormal   Collection Time: 03/22/19  2:53 PM  Result Value Ref Range   Acetaminophen (Tylenol), Serum <10 (L) 10 - 30 ug/mL  cbc     Status: None   Collection Time: 03/22/19  2:53 PM  Result Value Ref Range   WBC 7.8 4.0 - 10.5 K/uL   RBC 4.60 3.87 - 5.11 MIL/uL   Hemoglobin 13.9 12.0 - 15.0 g/dL   HCT 43.1 36.0 - 46.0 %   MCV 93.7 80.0 - 100.0 fL   MCH 30.2 26.0 - 34.0 pg   MCHC 32.3 30.0 - 36.0 g/dL   RDW 13.2 11.5 - 15.5 %   Platelets 282 150 - 400 K/uL   nRBC 0.0 0.0 - 0.2 %  CK     Status: None   Collection Time: 03/22/19  2:53 PM  Result Value Ref Range   Total CK 231 38 - 234 U/L   ____________________________________________ ____________________________________________  RADIOLOGY  I personally reviewed all radiographic images ordered to evaluate for the above acute complaints and reviewed radiology reports and findings.  These findings were personally discussed with the patient.  Please see medical record for radiology  report.  ____________________________________________   PROCEDURES  Procedure(s) performed:  Procedures    Critical Care performed: no ____________________________________________   INITIAL IMPRESSION / ASSESSMENT AND PLAN / ED COURSE  Pertinent labs & imaging results that were available during my care of the patient were reviewed by me and considered in my medical decision making (see chart for details).   DDX: Torticollis, fracture, radiculopathy, cord impingement, mass, CVA, dementia, electrolyte abnormality  Pamela Norman is a 78 y.o. who presents to the ED with was as described above.  Patient pleasant very talkative but in no acute distress.  Denies any focal deficits but blood was sent for the above differential.  She is a little odd but not agitated and seems to make since.  Her abdominal exam is soft and benign.  CT imaging of the head and neck will be ordered given her presentation.  Clinical Course as of Mar 21 1854  Sun Mar 22, 2019  1620 AST: 22 [PR]  1719 CT T cervical spine does show evidence of moderate cervical stenosis which may be explaining some of her "pulling "symptoms.  Does not explain the neurocognitive symptoms.  Will further evaluate with mri.   [PR]  1847 Patient states that she feels much improved.  Discussed the findings on MRI brain and cervical spine.  Discussed my concern about the cervical stenosis with the patient feels very well she is up ambulating.  She has no motor or sensory deficits.  Does not have any signs of acute cord injury or impingement.   States that she was told about cervical stenosis years ago but at that time was also not having any focal deficits.  Denies any pain or any discomfort at this time.  States that she felt like she was just having spasms in her neck which is since resolved.  Patient is a bit odd but does not have any SI or HI.  She is not showing any signs of hallucinations or  psychomotor agitation.  She is alert and  oriented x3.  I do not see any indication for which she would require IVC.  I do not feel that she needs emergent neurosurgical intervention given her lack of neuro symptoms at this time.  Blood work is reassuring.  Patient will be given referral to neurosurgery as an outpatient.  We discussed signs and symptoms for which she should return to the ER and patient agrees to plan.   [PR]    Clinical Course User Index [PR] Merlyn Lot, MD    The patient was evaluated in Emergency Department today for the symptoms described in the history of present illness. He/she was evaluated in the context of the global COVID-19 pandemic, which necessitated consideration that the patient might be at risk for infection with the SARS-CoV-2 virus that causes COVID-19. Institutional protocols and algorithms that pertain to the evaluation of patients at risk for COVID-19 are in a state of rapid change based on information released by regulatory bodies including the CDC and federal and state organizations. These policies and algorithms were followed during the patient's care in the ED.  As part of my medical decision making, I reviewed the following data within the [place] notes reviewed and incorporated, Labs reviewed, notes from prior ED visits and [place] Controlled Substance Database   ____________________________________________   FINAL CLINICAL IMPRESSION(S) / ED DIAGNOSES  Final diagnoses:  Cervical stenosis of spinal canal  Neck strain, initial encounter      NEW MEDICATIONS STARTED DURING THIS VISIT:  New Prescriptions   No medications on file     Note:  This document was prepared using Dragon voice recognition software and may include unintentional dictation errors.    Merlyn Lot, MD 03/22/19 [phone]

## 2019-03-22 NOTE — ED Notes (Signed)
Patient asked to leave pulse ox off.

## 2019-03-22 NOTE — ED Notes (Signed)
Patient is very upbeat, smiling. States she feels better after lying on the CT table and having to hold her neck straight. Patient states at home she sits in her chair and looks to the right out the window, holds her Ipad to the right and looks in her mirror to the right when she is checking her hair. Patient states usually before Covid-19 closings that she would be more active than she is now.

## 2019-03-22 NOTE — ED Notes (Addendum)
FIRST NURSE NOTE: Pt dropped off by friend who states pt has psych hx and needs psych eval,   Abelina Bachelor is friend, who pt gives consent to give information [phone]

## 2019-03-22 NOTE — ED Notes (Signed)
Patient states she has muscle strain when raising arms up to brush her hair. Patient states she is used to being active and [facility] [employer] and now has to stay at home due to Covid-19.

## 2019-03-22 NOTE — Discharge Instructions (Signed)
Return immediately for any worsening pain or any development of numbness tingling or weakness.  Return for any additional questions or concerns.

## 2019-03-24 ENCOUNTER — Other Ambulatory Visit: Payer: Self-pay

## 2019-03-24 ENCOUNTER — Encounter: Payer: Self-pay | Admitting: Emergency Medicine

## 2019-03-24 ENCOUNTER — Emergency Department
Admission: EM | Admit: 2019-03-24 | Discharge: 2019-03-24 | Disposition: A | Discharge status: Home / Self Care | Payer: Medicare Other | Attending: Emergency Medicine | Admitting: Emergency Medicine

## 2019-03-24 DIAGNOSIS — Z79899 Other long term (current) drug therapy: Secondary | ICD-10-CM | POA: Diagnosis not present

## 2019-03-24 DIAGNOSIS — E785 Hyperlipidemia, unspecified: Secondary | ICD-10-CM | POA: Diagnosis not present

## 2019-03-24 DIAGNOSIS — Z87891 Personal history of nicotine dependence: Secondary | ICD-10-CM | POA: Insufficient documentation

## 2019-03-24 DIAGNOSIS — F419 Anxiety disorder, unspecified: Secondary | ICD-10-CM | POA: Insufficient documentation

## 2019-03-24 DIAGNOSIS — R451 Restlessness and agitation: Secondary | ICD-10-CM | POA: Diagnosis not present

## 2019-03-24 LAB — COMPREHENSIVE METABOLIC PANEL
ALT: 23 U/L (ref 0–44)
AST: 19 U/L (ref 15–41)
Albumin: 4.3 g/dL (ref 3.5–5.0)
Alkaline Phosphatase: 75 U/L (ref 38–126)
Anion gap: 11 (ref 5–15)
BUN: 19 mg/dL (ref 8–23)
CO2: 27 mmol/L (ref 22–32)
Calcium: 9.4 mg/dL (ref 8.9–10.3)
Chloride: 104 mmol/L (ref 98–111)
Creatinine, Ser: 0.77 mg/dL (ref 0.44–1.00)
GFR calc Af Amer: >60 mL/min (ref >60)
GFR calc non Af Amer: >60 mL/min (ref >60)
Glucose, Bld: 133 mg/dL — ABNORMAL HIGH (ref 70–99)
Potassium: 3.7 mmol/L (ref 3.5–5.1)
Sodium: 142 mmol/L (ref 135–145)
Total Bilirubin: 0.5 mg/dL (ref 0.3–1.2)
Total Protein: 7.7 g/dL (ref 6.5–8.1)

## 2019-03-24 LAB — CBC WITH DIFFERENTIAL/PLATELET
Abs Immature Granulocytes: 0.03 10*3/uL (ref 0.00–0.07)
Basophils Absolute: 0 10*3/uL (ref 0.0–0.1)
Basophils Relative: 1 %
Eosinophils Absolute: 0.1 10*3/uL (ref 0.0–0.5)
Eosinophils Relative: 1 %
HCT: 41.2 % (ref 36.0–46.0)
Hemoglobin: 13.3 g/dL (ref 12.0–15.0)
Immature Granulocytes: 0 %
Lymphocytes Relative: 15 %
Lymphs Abs: 1.3 10*3/uL (ref 0.7–4.0)
MCH: 30.4 pg (ref 26.0–34.0)
MCHC: 32.3 g/dL (ref 30.0–36.0)
MCV: 94.3 fL (ref 80.0–100.0)
Monocytes Absolute: 0.7 10*3/uL (ref 0.1–1.0)
Monocytes Relative: 8 %
Neutro Abs: 6.6 10*3/uL (ref 1.7–7.7)
Neutrophils Relative %: 75 %
Platelets: 275 10*3/uL (ref 150–400)
RBC: 4.37 MIL/uL (ref 3.87–5.11)
RDW: 13.2 % (ref 11.5–15.5)
WBC: 8.7 10*3/uL (ref 4.0–10.5)
nRBC: 0 % (ref 0.0–0.2)

## 2019-03-24 LAB — GLUCOSE, CAPILLARY: Glucose-Capillary: 116 mg/dL — ABNORMAL HIGH (ref 70–99)

## 2019-03-24 LAB — ETHANOL: Alcohol, Ethyl (B): <10 mg/dL (ref <10)

## 2019-03-24 LAB — TROPONIN I: Troponin I: <0.03 ng/mL (ref <0.03)

## 2019-03-24 MED ORDER — DIAZEPAM 5 MG PO TABS
5.0000 mg | ORAL_TABLET | Freq: Once | ORAL | Status: AC
  Administered 2019-03-24: 5 mg via ORAL
  Filled 2019-03-24: qty 1

## 2019-03-24 MED ORDER — DULOXETINE HCL 30 MG PO CPEP: 30.0000 mg | ORAL_CAPSULE | Freq: Every day | ORAL | 2 refills | Status: DC

## 2019-03-24 NOTE — ED Notes (Signed)
Psychiatrist, dr. Meredith Pel speaking with pt.

## 2019-03-24 NOTE — ED Notes (Signed)
"  I just don't feel right - somebody ahs to give me a pill to make me feel better  - somebody has to give me a pill to make me feel better - My legs are not even  - I took that medicine last night  (risperdal) and I woke up like this - something wrong with my neck and my legs are not even."  She continues to stand up and then sit down on the bed  - up and down  "I think I am going to faint - somebody needs to get me some medicine"  Reassurance provided to her  Attempt made to calm her  Continue to monitor

## 2019-03-24 NOTE — ED Notes (Signed)
Pt up to restroom.

## 2019-03-24 NOTE — ED Notes (Signed)
Pt talking on phone in n o acute distress.  

## 2019-03-24 NOTE — ED Triage Notes (Signed)
Neck pain and body is out of balance--seen for same few days ago.  Ambulatory and in nad.

## 2019-03-24 NOTE — ED Provider Notes (Signed)
Patient has been seen by me, she does seem agitated and is requesting something to help her calm down.  I will consult psychiatry and TTS.   Earleen Newport, MD 03/24/19 [phone]

## 2019-03-24 NOTE — ED Notes (Signed)
Friend who brought patient in came back into lobby and states that patient has had symptoms like this a few years ago and ended up transferring to [facility] for psych.  Per friend, pt may have stopped taking her medications

## 2019-03-24 NOTE — ED Provider Notes (Signed)
[facility] Emergency Department Provider Note  ____________________________________________  Time seen: Approximately 12:58 PM  I have reviewed the triage vital signs and the nursing notes.   HISTORY  Chief Complaint Neck Pain    HPI Pamela Norman is a 78 y.o. female that presents to the emergency department because her "body is out of line." Patient states that she was seen in the emergency department 2 days ago and received great treatment.  She felt great yesterday but "woke up like this." She is having difficulty articulating her symptoms.  She had a telemetry psych visit last week and states that she was put back on risperidone and duloxetine but it was her choice to do this.  Her friends convinced her to take a risperidone yesterday, and she is concerned this is what caused her to feel "off" today.  Her "well-meaning friends" are concerned for her mental health.  She has been on both medicines in the past and reportedly discontinued these medicines several months ago.   Past Medical History:  Diagnosis Date  . Cataract   . Endometrial cancer ([place]) 2016   Grade 1,total laparoscopic hysterectomy  . History of colonoscopy 2014   within normal limits  . History of ovarian cyst 01/2015  . Hyperlipidemia   . Rectal prolapse   . Squamous cell carcinoma    face    Patient Active Problem List   Diagnosis Date Noted  . Skin lesions 12/12/2017  . Elevated blood pressure reading 12/12/2017  . Diabetes mellitus without complication ([place]) 09/32/6712  . History of psychosis 09/17/2016  . Osteopenia 09/17/2016  . Vitamin D deficiency 09/17/2016  . History of endometrial cancer 05/04/2015  . Hyperlipidemia 05/21/2014    Past Surgical History:  Procedure Laterality Date  . ABDOMINAL HYSTERECTOMY    . APPENDECTOMY    . BILATERAL SALPINGOOPHORECTOMY  03/30/15  . CATARACT EXTRACTION W/PHACO Right 02/06/2016   Procedure: CATARACT EXTRACTION PHACO AND  INTRAOCULAR LENS PLACEMENT (IOC);  Surgeon: Estill Cotta, MD;  Location: [facility] ORS;  Service: Ophthalmology;  Laterality: Right;  [place] 01:39 AP% 24.9 CDE 47.32 fluid pack lot # [id number]  . CATARACT EXTRACTION W/PHACO Left 03/05/2016   Procedure: CATARACT EXTRACTION PHACO AND INTRAOCULAR LENS PLACEMENT ([place]);  Surgeon: Estill Cotta, MD;  Location: [facility] ORS;  Service: Ophthalmology;  Laterality: Left;  [place] 01:02 AP% 23.2 CDE 26.73 fluid pack lot # [id number]  . COMBINED HYSTEROSCOPY DIAGNOSTIC / D&C  2016  . DILATION AND CURETTAGE OF UTERUS    . dnc    . LAPAROSCOPIC HYSTERECTOMY  03/30/15  . NASAL SINUS SURGERY    . SKIN CANCER EXCISION     UPPER LIP  . TONSILECTOMY, ADENOIDECTOMY, BILATERAL MYRINGOTOMY AND TUBES    . TONSILLECTOMY      Prior to Admission medications   Medication Sig Start Date End Date Taking? Authorizing Provider  atorvastatin (LIPITOR) 40 MG tablet TAKE 1 TABLET(40 MG) BY MOUTH DAILY 12/12/18   Leone Haven, MD  cholecalciferol (VITAMIN D) 1000 units tablet Take 1,000 Units by mouth daily.    [provider]  DULoxetine (CYMBALTA) 30 MG capsule 30 mg.  10/28/16   [provider]  metFORMIN (GLUCOPHAGE) 500 MG tablet TAKE 1 TABLET(500 MG) BY MOUTH DAILY WITH BREAKFAST 01/30/19   Leone Haven, MD  Multiple Vitamin (MULTIVITAMIN) tablet Take 1 tablet by mouth daily.    [provider]  risperiDONE (RISPERDAL) 1 MG tablet at bedtime.  10/12/15   [provider]  Allergies Patient has no known allergies.  Family History  Problem Relation Age of Onset  . Colon cancer Father        older age onset  . COPD Father   . COPD Mother   . Breast cancer Neg Hx   . Ovarian cancer Neg Hx   . Diabetes Neg Hx   . Heart disease Neg Hx     Social History Social History   Tobacco Use  . Smoking status: Former Smoker    Packs/day: 0.25    Years: 20.00    Pack years: 5.00    Last attempt to quit: 04/13/1981    Years since  quitting: 37.9  . Smokeless tobacco: Never Used  Substance Use Topics  . Alcohol use: No  . Drug use: No     Review of Systems  Cardiovascular: No chest pain. Respiratory: No SOB. Gastrointestinal: No abdominal pain.  Musculoskeletal: Negative for musculoskeletal pain. Skin: Negative for rash, abrasions, lacerations, ecchymosis. Neurological: Negative for headaches   ____________________________________________   PHYSICAL EXAM:  VITAL SIGNS: ED Triage Vitals  Enc Vitals Group     BP 03/24/19 1206 (!) 168/94     Pulse Rate 03/24/19 1206 92     Resp 03/24/19 1206 16     Temp 03/24/19 1206 98 F (36.7 C)     Temp Source 03/24/19 1206 Oral     SpO2 03/24/19 1206 99 %     Weight 03/24/19 1207 168 lb (76.2 kg)     Height 03/24/19 1207 5\' 6"  (1.676 m)     Head Circumference --      Peak Flow --      Pain Score 03/24/19 1206 8     Pain Loc --      Pain Edu? --      Excl. in [place]? --      Constitutional: Alert and oriented. Well appearing and in no acute distress. Eyes: Conjunctivae are normal. PERRL. EOMI. Head: Atraumatic. ENT:      Ears:      Nose: No congestion/rhinnorhea.      Mouth/Throat: Mucous membranes are moist.  Neck: No stridor.   Cardiovascular: Normal rate, regular rhythm.  Good peripheral circulation. Respiratory: Normal respiratory effort without tachypnea or retractions. Lungs CTAB. Good air entry to the bases with no decreased or absent breath sounds. Musculoskeletal: Full range of motion to all extremities. No gross deformities appreciated. Neurologic:  Normal speech and language. No gross focal neurologic deficits are appreciated.  Skin:  Skin is warm, dry and intact. No rash noted. Psychiatric: Mood and affect are normal. Speech and behavior are normal. Patient exhibits appropriate insight and judgement.   ____________________________________________   LABS (all labs ordered are listed, but only abnormal results are displayed)  Labs Reviewed  - No data to display ____________________________________________  EKG   ____________________________________________  RADIOLOGY  No results found.  ____________________________________________    PROCEDURES  Procedure(s) performed:    Procedures    Medications - No data to display   ____________________________________________   INITIAL IMPRESSION / ASSESSMENT AND PLAN / ED COURSE  Pertinent labs & imaging results that were available during my care of the patient were reviewed by me and considered in my medical decision making (see chart for details).  Review of the [place] CSRS was performed in accordance of the [place] prior to dispensing any controlled drugs.     Patient presented to emergency department for feeling out of line for the last several of days.  Patient received a full work-up in the emergency department that included CT cervical, head MRI cervical, head, blood work 2 days ago with reassuring results.  Patient is extremely pleasant and talkative in the emergency department.  She is having difficulty articulating her symptoms, pacing the room and having difficulty staying on track with conversation.  Her friends are concerned for her mental health and states that she had to be transferred to a psychiatric unit several years ago when she had similar symptoms.  Patient had a telehealth visit and was put back on her risperidone and duloxetine last week.  She took 1 dose of risperidone yesterday but thinks this is what is making her feel bad today.  On chart review, she has a history of conversion disorder, delusions, and psychosis.  It is unclear when she went off of her risperidone and duloxetine in the past.  I feel that she would benefit from a psychiatry consult today.  Patient is agreeable with this.  Report was given to Dr. Jimmye Norman and transferred to main.      ____________________________________________  FINAL CLINICAL IMPRESSION(S) / ED DIAGNOSES  Final  diagnoses:  None      NEW MEDICATIONS STARTED DURING THIS VISIT:  ED Discharge Orders    None          This chart was dictated using voice recognition software/Dragon. Despite best efforts to proofread, errors can occur which can change the meaning. Any change was purely unintentional.    Laban Emperor, PA-C 03/24/19 1334    Earleen Newport, MD 03/24/19 [phone]

## 2019-03-24 NOTE — ED Notes (Signed)
See triage note  states

## 2019-03-24 NOTE — ED Notes (Signed)
Report given to Amy T RN  Moved to [address] hall

## 2019-03-24 NOTE — ED Notes (Signed)
Waiting on pt's ride

## 2019-03-24 NOTE — Consult Note (Signed)
[facility] Psychiatry Consult   Reason for Consult:  anxiety Referring Physician:  Emergency doctor Patient Identification: Pamela Norman MRN:  [id number] Principal Diagnosis: <principal problem not specified> Diagnosis:  Active Problems:   * No active hospital problems. *   Total Time spent with patient: 30 minutes  Subjective:   Pamela Norman is a 78 y.o. female patient admitted with anxiety.  HPI: Patient is seen and examined.  Patient is a 78 year old female with a past psychiatric history significant for major depression as well as generalized anxiety disorder who presented to the [facility] emergency department on 03/24/2019 with feeling as though "my body was out of alignment".  The patient was seen in the emergency department 2 days prior to this, and was released.  She was seen by telehealth with her outpatient psychiatry provider couple weeks ago, and the patient had wanted to come off her Risperdal and duloxetine.  The patient had wanted to come off of these.  The dosages had been reduced.  She had been taking the duloxetine on a as needed basis.  Since the coronavirus issues have come up her anxiety has increased, and she feels physically agitated, and anxious.  She has not been compliant with her Risperdal as well.  Physical examination today showed no evidence of extraparametal symptoms or tardive dyskinesia.  She is scheduled to have an evaluation I believe with the psychiatrist via her mental health center tomorrow by telehealth.  She denied any suicidal or homicidal ideation.  She denied any auditory or visual hallucinations.  Her sleep has been intermittently not good.  She is anxious.  Past Psychiatric History: Patient has one previous psychiatric admission after the diagnosis of her endometrial cancer years ago.  She apparently had psychotic symptoms as well as depression anxiety at that time.  She has been on these medications for at least the last 5  to 6 years.  Risk to Self:   Risk to Others:   Prior Inpatient Therapy:   Prior Outpatient Therapy:    Past Medical History:  Past Medical History:  Diagnosis Date  . Cataract   . Endometrial cancer ([place]) 2016   Grade 1,total laparoscopic hysterectomy  . History of colonoscopy 2014   within normal limits  . History of ovarian cyst 01/2015  . Hyperlipidemia   . Rectal prolapse   . Squamous cell carcinoma    face    Past Surgical History:  Procedure Laterality Date  . ABDOMINAL HYSTERECTOMY    . APPENDECTOMY    . BILATERAL SALPINGOOPHORECTOMY  03/30/15  . CATARACT EXTRACTION W/PHACO Right 02/06/2016   Procedure: CATARACT EXTRACTION PHACO AND INTRAOCULAR LENS PLACEMENT (IOC);  Surgeon: Estill Cotta, MD;  Location: [facility] ORS;  Service: Ophthalmology;  Laterality: Right;  [place] 01:39 AP% 24.9 CDE 47.32 fluid pack lot # [id number]  . CATARACT EXTRACTION W/PHACO Left 03/05/2016   Procedure: CATARACT EXTRACTION PHACO AND INTRAOCULAR LENS PLACEMENT ([place]);  Surgeon: Estill Cotta, MD;  Location: [facility] ORS;  Service: Ophthalmology;  Laterality: Left;  [place] 01:02 AP% 23.2 CDE 26.73 fluid pack lot # [id number]  . COMBINED HYSTEROSCOPY DIAGNOSTIC / D&C  2016  . DILATION AND CURETTAGE OF UTERUS    . dnc    . LAPAROSCOPIC HYSTERECTOMY  03/30/15  . NASAL SINUS SURGERY    . SKIN CANCER EXCISION     UPPER LIP  . TONSILECTOMY, ADENOIDECTOMY, BILATERAL MYRINGOTOMY AND TUBES    . TONSILLECTOMY     Family History:  Family History  Problem Relation Age of Onset  . Colon cancer Father        older age onset  . COPD Father   . COPD Mother   . Breast cancer Neg Hx   . Ovarian cancer Neg Hx   . Diabetes Neg Hx   . Heart disease Neg Hx    Family Psychiatric  History: Noncontributory Social History:  Social History   Substance and Sexual Activity  Alcohol Use No     Social History   Substance and Sexual Activity  Drug Use No    Social History   Socioeconomic History  . Marital  status: Single    Spouse name: Not on file  . Number of children: 0  . Years of education: PhD  . Highest education level: Not on file  Occupational History  . Occupation: Retired  Scientific laboratory technician  . Financial resource strain: Not hard at all  . Food insecurity:    Worry: Not on file    Inability: Not on file  . Transportation needs:    Medical: No    Non-medical: No  Tobacco Use  . Smoking status: Former Smoker    Packs/day: 0.25    Years: 20.00    Pack years: 5.00    Last attempt to quit: 04/13/1981    Years since quitting: 37.9  . Smokeless tobacco: Never Used  Substance and Sexual Activity  . Alcohol use: No  . Drug use: No  . Sexual activity: Not Currently  Lifestyle  . Physical activity:    Days per week: 5 days    Minutes per session: 30 min  . Stress: Not at all  Relationships  . Social connections:    Talks on phone: Not on file    Gets together: Not on file    Attends religious service: Not on file    Active member of club or organization: Not on file    Attends meetings of clubs or organizations: Not on file    Relationship status: Not on file  Other Topics Concern  . Not on file  Social History Narrative   Lives in [place]. No children.      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      1-2 cups caffeine daily.   Additional Social History:    Allergies:  No Known Allergies  Labs:  Results for orders placed or performed during the hospital encounter of 03/24/19 (from the past 48 hour(s))  CBC with Differential     Status: None   Collection Time: 03/24/19  1:29 PM  Result Value Ref Range   WBC 8.7 4.0 - 10.5 K/uL   RBC 4.37 3.87 - 5.11 MIL/uL   Hemoglobin 13.3 12.0 - 15.0 g/dL   HCT 41.2 36.0 - 46.0 %   MCV 94.3 80.0 - 100.0 fL   MCH 30.4 26.0 - 34.0 pg   MCHC 32.3 30.0 - 36.0 g/dL   RDW 13.2 11.5 - 15.5 %   Platelets 275 150 - 400 K/uL    nRBC 0.0 0.0 - 0.2 %   Neutrophils Relative % 75 %   Neutro Abs 6.6 1.7 - 7.7 K/uL   Lymphocytes Relative 15 %   Lymphs Abs 1.3 0.7 - 4.0 K/uL   Monocytes Relative 8 %   Monocytes Absolute 0.7 0.1 - 1.0 K/uL   Eosinophils  Relative 1 %   Eosinophils Absolute 0.1 0.0 - 0.5 K/uL   Basophils Relative 1 %   Basophils Absolute 0.0 0.0 - 0.1 K/uL   Immature Granulocytes 0 %   Abs Immature Granulocytes 0.03 0.00 - 0.07 K/uL    Comment: Performed at [facility], [address] [place], [place] [address]  Comprehensive metabolic panel     Status: Abnormal   Collection Time: 03/24/19  1:29 PM  Result Value Ref Range   Sodium 142 135 - 145 mmol/L   Potassium 3.7 3.5 - 5.1 mmol/L   Chloride 104 98 - 111 mmol/L   CO2 27 22 - 32 mmol/L   Glucose, Bld 133 (H) 70 - 99 mg/dL   BUN 19 8 - 23 mg/dL   Creatinine, Ser 0.77 0.44 - 1.00 mg/dL   Calcium 9.4 8.9 - 10.3 mg/dL   Total Protein 7.7 6.5 - 8.1 g/dL   Albumin 4.3 3.5 - 5.0 g/dL   AST 19 15 - 41 U/L   ALT 23 0 - 44 U/L   Alkaline Phosphatase 75 38 - 126 U/L   Total Bilirubin 0.5 0.3 - 1.2 mg/dL   GFR calc non Af Amer >60 >60 mL/min   GFR calc Af Amer >60 >60 mL/min   Anion gap 11 5 - 15    Comment: Performed at [facility], [address], [place], [place] [address]  Ethanol     Status: None   Collection Time: 03/24/19  1:29 PM  Result Value Ref Range   Alcohol, Ethyl (B) <10 <10 mg/dL    Comment: (NOTE) Lowest detectable limit for serum alcohol is 10 mg/dL. For medical purposes only. Performed at [facility], [address] [place], [place] [address]   Troponin I - ONCE - STAT     Status: None   Collection Time: 03/24/19  1:29 PM  Result Value Ref Range   Troponin I <0.03 <0.03 ng/mL    Comment: Performed at [facility], [address] [place], [place] [address]  Glucose, capillary     Status: Abnormal   Collection Time: 03/24/19  1:34 PM  Result Value Ref Range   Glucose-Capillary  116 (H) 70 - 99 mg/dL    No current facility-administered medications for this encounter.    Current Outpatient Medications  Medication Sig Dispense Refill  . atorvastatin (LIPITOR) 40 MG tablet TAKE 1 TABLET(40 MG) BY MOUTH DAILY 90 tablet 1  . cholecalciferol (VITAMIN D) 1000 units tablet Take 1,000 Units by mouth daily.    . DULoxetine (CYMBALTA) 30 MG capsule 30 mg.     . metFORMIN (GLUCOPHAGE) 500 MG tablet TAKE 1 TABLET(500 MG) BY MOUTH DAILY WITH BREAKFAST 90 tablet 0  . Multiple Vitamin (MULTIVITAMIN) tablet Take 1 tablet by mouth daily.    . risperiDONE (RISPERDAL) 1 MG tablet at bedtime.       Musculoskeletal: Strength & Muscle Tone: within normal limits Gait & Station: normal Patient leans: N/A  Psychiatric Specialty Exam: Physical Exam  Nursing note and vitals reviewed. Constitutional: She is oriented to person, place, and time. She appears well-developed and well-nourished.  HENT:  Head: Normocephalic and atraumatic.  Respiratory: Effort normal.  Neurological: She is alert and oriented to person, place, and time.    ROS  Blood pressure (!) 168/94, pulse 92, temperature 98 F (36.7 C), temperature source Oral, resp. rate 16, height 5\' 6"  (1.676 m), weight 76.2 kg, SpO2 99 %.Body mass index is 27.12 kg/m.  General Appearance: Casual  Eye Contact:  Good  Speech:  Normal Rate  Volume:  Normal  Mood:  Anxious  Affect:  Congruent  Thought Process:  Coherent and Descriptions of Associations: Intact  Orientation:  Full (Time, Place, and Person)  Thought Content:  Logical  Suicidal Thoughts:  No  Homicidal Thoughts:  No  Memory:  Immediate;   Fair Recent;   Fair Remote;   Fair  Judgement:  Intact  Insight:  Fair  Psychomotor Activity:  Increased  Concentration:  Concentration: Fair and Attention Span: Fair  Recall:  [employer] of Knowledge:  Fair  Language:  Fair  Akathisia:  Negative  Handed:  Right  AIMS (if indicated):     Assets:  Desire for  Improvement Housing Resilience  ADL's:  Intact  Cognition:  WNL  Sleep:        Treatment Plan Summary: Daily contact with patient to assess and evaluate symptoms and progress in treatment, Medication management and Plan : Patient is seen and examined.  Patient is a 78 year old female with the above-stated past psychiatric history is seen in the emergency room on consultation.  She is anxious, and has only been taking her Cymbalta/duloxetine on a as needed basis.  I discussed this with the patient.  She is going to go on and take it on a regular basis.  She does not have a prescription for that, and we will provide that for her today.  She does not require psychiatric hospitalization.  She is not suicidal, homicidal or psychotic.  I have encouraged her to take the Risperdal as well.  There is no evidence of any EPS or tardive dyskinesia from the Risperdal.  She just needs better outpatient care.  Disposition: Patient does not meet criteria for psychiatric inpatient admission.  Sharma Covert, MD 03/24/2019 2:44 PM

## 2019-03-24 NOTE — ED Triage Notes (Signed)
Pt in via POV, reports waking up with neck pain today, states she was seen here recently for same.  Ambulatory to triage, NAD noted at this time.

## 2019-03-24 NOTE — ED Notes (Signed)
Report from amy, rn.

## 2019-03-24 NOTE — ED Notes (Signed)
See triage note  Presents with "my body being out of line"  States she was seen Sunday for same issues  Had a good day yesterday but woke up this am feeling worse  Her gait is normal   Denies any n/v/ or fever   Speech is "all over the place'

## 2019-03-25 DIAGNOSIS — F060 Psychotic disorder with hallucinations due to known physiological condition: Secondary | ICD-10-CM | POA: Diagnosis not present

## 2019-04-30 ENCOUNTER — Other Ambulatory Visit: Payer: Self-pay | Admitting: Family Medicine

## 2019-05-13 ENCOUNTER — Ambulatory Visit (INDEPENDENT_AMBULATORY_CARE_PROVIDER_SITE_OTHER): Payer: Medicare Other | Admitting: Family Medicine

## 2019-05-13 ENCOUNTER — Other Ambulatory Visit: Payer: Self-pay

## 2019-05-13 ENCOUNTER — Encounter: Payer: Self-pay | Admitting: Family Medicine

## 2019-05-13 DIAGNOSIS — E663 Overweight: Secondary | ICD-10-CM

## 2019-05-13 DIAGNOSIS — E119 Type 2 diabetes mellitus without complications: Secondary | ICD-10-CM

## 2019-05-13 DIAGNOSIS — E785 Hyperlipidemia, unspecified: Secondary | ICD-10-CM

## 2019-05-13 DIAGNOSIS — Z8659 Personal history of other mental and behavioral disorders: Secondary | ICD-10-CM

## 2019-05-13 NOTE — Assessment & Plan Note (Signed)
-  Continue Lipitor °

## 2019-05-13 NOTE — Assessment & Plan Note (Signed)
She will continue with exercise and try to work on decreasing her snacking at night.

## 2019-05-13 NOTE — Assessment & Plan Note (Signed)
Asymptomatic.  She will continue on her Cymbalta and Risperdal and continue to follow with psychiatry.

## 2019-05-13 NOTE — Progress Notes (Signed)
Virtual Visit via telephone Note  This visit type was conducted due to national recommendations for restrictions regarding the COVID-19 pandemic (e.g. social distancing).  This format is felt to be most appropriate for this patient at this time.  All issues noted in this document were discussed and addressed.  No physical exam was performed (except for noted visual exam findings with Video Visits).   I connected with Alain Marion today at  1:45 PM EDT by telephone and verified that I am speaking with the correct person using two identifiers. Location patient: home Location provider: work Persons participating in the virtual visit: patient, provider  I discussed the limitations, risks, security and privacy concerns of performing an evaluation and management service by telephone and the availability of in person appointments. I also discussed with the patient that there may be a patient responsible charge related to this service. The patient expressed understanding and agreed to proceed.  Interactive audio and video telecommunications were attempted between this provider and patient, however failed, due to patient having technical difficulties OR patient did not have access to video capability.  We continued and completed visit with audio only.  Reason for visit: Follow-up.  HPI: Diabetes: Not checking blood sugars.  Taking metformin.  No polyuria or polydipsia.  No hypoglycemia.  Hyperlipidemia: Taking Lipitor.  No chest pain, claudication, right upper quadrant pain, or myalgias.  History of psychosis: Patient had some issues in April when she had been off of her medication for some time.  She was evaluated in the emergency department and was evaluated by psychiatry and they recommended that she needs to consistently take her Cymbalta and risperidone.  She has been back on these and has had no issues with psychosis, depression, or anxiety.  She continues to follow with her psychiatrist.   Overweight: Patient has been working on exercise with walking on a treadmill and riding a bike for 30 minutes daily.  She eats healthily during the daytime though does snack some at night and is working on that as well.   ROS: See pertinent positives and negatives per HPI.  Past Medical History:  Diagnosis Date  . Cataract   . Endometrial cancer ([place]) 2016   Grade 1,total laparoscopic hysterectomy  . History of colonoscopy 2014   within normal limits  . History of ovarian cyst 01/2015  . Hyperlipidemia   . Rectal prolapse   . Squamous cell carcinoma    face    Past Surgical History:  Procedure Laterality Date  . ABDOMINAL HYSTERECTOMY    . APPENDECTOMY    . BILATERAL SALPINGOOPHORECTOMY  03/30/15  . CATARACT EXTRACTION W/PHACO Right 02/06/2016   Procedure: CATARACT EXTRACTION PHACO AND INTRAOCULAR LENS PLACEMENT (IOC);  Surgeon: Estill Cotta, MD;  Location: [facility] ORS;  Service: Ophthalmology;  Laterality: Right;  [place] 01:39 AP% 24.9 CDE 47.32 fluid pack lot # [id number]  . CATARACT EXTRACTION W/PHACO Left 03/05/2016   Procedure: CATARACT EXTRACTION PHACO AND INTRAOCULAR LENS PLACEMENT ([place]);  Surgeon: Estill Cotta, MD;  Location: [facility] ORS;  Service: Ophthalmology;  Laterality: Left;  [place] 01:02 AP% 23.2 CDE 26.73 fluid pack lot # [id number]  . COMBINED HYSTEROSCOPY DIAGNOSTIC / D&C  2016  . DILATION AND CURETTAGE OF UTERUS    . dnc    . LAPAROSCOPIC HYSTERECTOMY  03/30/15  . NASAL SINUS SURGERY    . SKIN CANCER EXCISION     UPPER LIP  . TONSILECTOMY, ADENOIDECTOMY, BILATERAL MYRINGOTOMY AND TUBES    . TONSILLECTOMY  Family History  Problem Relation Age of Onset  . Colon cancer Father        older age onset  . COPD Father   . COPD Mother   . Breast cancer Neg Hx   . Ovarian cancer Neg Hx   . Diabetes Neg Hx   . Heart disease Neg Hx     SOCIAL HX: Former smoker.   Current Outpatient Medications:  .  atorvastatin (LIPITOR) 40 MG tablet, TAKE 1 TABLET(40 MG)  BY MOUTH DAILY, Disp: 90 tablet, Rfl: 1 .  cholecalciferol (VITAMIN D) 1000 units tablet, Take 1,000 Units by mouth daily., Disp: , Rfl:  .  DULoxetine (CYMBALTA) 30 MG capsule, Take 1 capsule (30 mg total) by mouth daily., Disp: 30 capsule, Rfl: 2 .  metFORMIN (GLUCOPHAGE) 500 MG tablet, TAKE 1 TABLET(500 MG) BY MOUTH DAILY WITH BREAKFAST, Disp: 90 tablet, Rfl: 0 .  Multiple Vitamin (MULTIVITAMIN) tablet, Take 1 tablet by mouth daily., Disp: , Rfl:  .  risperiDONE (RISPERDAL) 1 MG tablet, at bedtime. , Disp: , Rfl:   EXAM:  VITALS per patient if applicable: None.  GENERAL: alert, oriented, appears well and in no acute distress  HEENT: atraumatic, conjunttiva clear, no obvious abnormalities on inspection of external nose and ears  NECK: normal movements of the head and neck  LUNGS: on inspection no signs of respiratory distress, breathing rate appears normal, no obvious gross SOB, gasping or wheezing  CV: no obvious cyanosis  MS: moves all visible extremities without noticeable abnormality  PSYCH/NEURO: pleasant and cooperative, no obvious depression or anxiety, speech and thought processing grossly intact  ASSESSMENT AND PLAN:  Discussed the following assessment and plan:  Diabetes mellitus without complication (HCC) - Plan: Hemoglobin A1c  Hyperlipidemia, unspecified hyperlipidemia type  History of psychosis  Overweight  Diabetes mellitus without complication ([place]) Patient is hesitant to come in for labs.  We will get her scheduled for an A1c in about 3 months.  Continue metformin.  Hyperlipidemia Continue Lipitor.  History of psychosis Asymptomatic.  She will continue on her Cymbalta and Risperdal and continue to follow with psychiatry.  Overweight She will continue with exercise and try to work on decreasing her snacking at night.  [place] office staff will contact patient to schedule lab work in about 3 months.  Patient will keep her follow-up visit in December.   Social distancing precautions and sick precautions given regarding COVID-19.   I discussed the assessment and treatment plan with the patient. The patient was provided an opportunity to ask questions and all were answered. The patient agreed with the plan and demonstrated an understanding of the instructions.   The patient was advised to call back or seek an in-person evaluation if the symptoms worsen or if the condition fails to improve as anticipated.  I provided 18 minutes of non-face-to-face time during this encounter.   Tommi Rumps, MD

## 2019-05-13 NOTE — Assessment & Plan Note (Signed)
Patient is hesitant to come in for labs.  We will get her scheduled for an A1c in about 3 months.  Continue metformin.

## 2019-05-20 DIAGNOSIS — F060 Psychotic disorder with hallucinations due to known physiological condition: Secondary | ICD-10-CM | POA: Diagnosis not present

## 2019-06-13 ENCOUNTER — Other Ambulatory Visit: Payer: Self-pay | Admitting: Family Medicine

## 2019-06-17 ENCOUNTER — Inpatient Hospital Stay: Payer: Medicare Other | Attending: Obstetrics and Gynecology | Admitting: Obstetrics and Gynecology

## 2019-06-17 ENCOUNTER — Other Ambulatory Visit: Payer: Self-pay

## 2019-06-17 VITALS — BP 139/82 | HR 87 | Temp 97.7°F | Ht 66.0 in | Wt 177.6 lb

## 2019-06-17 DIAGNOSIS — E559 Vitamin D deficiency, unspecified: Secondary | ICD-10-CM | POA: Diagnosis not present

## 2019-06-17 DIAGNOSIS — E785 Hyperlipidemia, unspecified: Secondary | ICD-10-CM | POA: Diagnosis not present

## 2019-06-17 DIAGNOSIS — Z923 Personal history of irradiation: Secondary | ICD-10-CM | POA: Diagnosis not present

## 2019-06-17 DIAGNOSIS — Z8542 Personal history of malignant neoplasm of other parts of uterus: Secondary | ICD-10-CM | POA: Insufficient documentation

## 2019-06-17 DIAGNOSIS — E119 Type 2 diabetes mellitus without complications: Secondary | ICD-10-CM | POA: Diagnosis not present

## 2019-06-17 DIAGNOSIS — M8580 Other specified disorders of bone density and structure, unspecified site: Secondary | ICD-10-CM | POA: Diagnosis not present

## 2019-06-17 DIAGNOSIS — Z87891 Personal history of nicotine dependence: Secondary | ICD-10-CM | POA: Diagnosis not present

## 2019-06-17 DIAGNOSIS — Z90722 Acquired absence of ovaries, bilateral: Secondary | ICD-10-CM | POA: Insufficient documentation

## 2019-06-17 DIAGNOSIS — Z08 Encounter for follow-up examination after completed treatment for malignant neoplasm: Secondary | ICD-10-CM | POA: Diagnosis not present

## 2019-06-17 DIAGNOSIS — Z90710 Acquired absence of both cervix and uterus: Secondary | ICD-10-CM | POA: Diagnosis not present

## 2019-06-17 NOTE — Progress Notes (Signed)
Gynecologic Oncology Interval Note  Referring Provider: Dr. Malachi Paradise (retired)  Chief Concern: Endometrial cancer surveillance  Subjective:  Pamela Norman is a 78 y.o. female with stage IA grade 1 endometrial cancer who returns to clinic for surveillance.   She was last seen by Dr. Fransisca Connors via telephone is April and was asymptomatic at that time. Last pelvic exam was 09/2018. Today she says she feels well and denies complaints. No constipation, changes in bowel movements, pain, or bleeding.   Last mammogram was 08/19/2018 reported as bi-rads category 2. Repeat 08/2019.  Last bone density was 08/19/2018 consistent with osteopenia. Arranged through PCP. Due 08/2020.  Last colonoscopy in 07/16/2013 with Dr. Gustavo Lah. Recommended repeating in 2019.   HPI:  Please refer to prior notes for complete details. On 03/30/15 she underwent total laparoscopic hysterectomy, bilateral salpingo-oophorectomy, pelvic sentinel lymph node mapping, and repair of vaginal lacerations caused by removal of the uterus transvaginally.  Her final pathology as noted below.  Sentinel nodes were not identified and node dissection not done since frozen showed grade 1 tumor invading less than 50% (4/9 mm).  Since final path showed LVSI she was referred to Dr. Baruch Gouty for vaginal brachytherapy. She tolerated 6 HDR treatments well.    DIAGNOSIS:  A. UTERUS; HYSTERECTOMY:  - ENDOMETRIOID CARCINOMA WITH SQUAMOUS DIFFERENTIATION, GRADE 1, INVADING LESS THAN ONE HALF OF MYOMETRIUM.  - CERVIX NOT INVOLVED.  - INTRAMURAL LEIOMYOMA.   RIGHT AND LEFT FALLOPIAN TUBES AND OVARIES; SALPINGO-OOPHORECTOMY:  - NOT INVOLVED.  - BENIGN SIMPLE CYST OF RIGHT OVARY, 3.2 CM.  TUMOR  Histologic Type:  Endometrioid adenocarcinoma, variant (specify) with squamous differentiation  Histologic Grade:  FIGO grade 1   EXTENT:  Tumor Size: 3.5cm at Greatest dimension (cm)  Myometrial Invasion: Present  Depth of Myometrial Invasion:  Specify depth of invasion (mm) - 60mm  Myometrial Thickness (mm):  8mm  Involvement of Cervix:  Not involved  Other Organs Submitted:  Right ovary - Not involved  Left ovary - Not involved  Right fallopian tube  - Not involved  Left fallopian tube - Not involved   ACCESSORY FINDINGS  Lymph-Vascular Invasion: Present   STAGE (pTNM [FIGO])  Primary Tumor (pT):  pT1a [IA]: Tumor limited to endometrium or invades less than one-half  of the myometrium  Regional Lymph Nodes (pN)  pNX: Cannot be assessed  Pelvic Lymph Nodes: No pelvic nodes submitted or found  Para-aortic Lymph Nodes: No para-aortic nodes submitted or found  Distant Metastasis (pM): Not applicable   Cytology: DIAGNOSIS:  A. PELVIC WASHINGS:  - NEGATIVE FOR MALIGNANCY.    Problem List: Patient Active Problem List   Diagnosis Date Noted  . Overweight 05/13/2019  . Skin lesions 12/12/2017  . Elevated blood pressure reading 12/12/2017  . Diabetes mellitus without complication ([place]) 06/04/9484  . History of psychosis 09/17/2016  . Osteopenia 09/17/2016  . Vitamin D deficiency 09/17/2016  . History of endometrial cancer 05/04/2015  . Hyperlipidemia 05/21/2014    Past Medical History: Past Medical History:  Diagnosis Date  . Cataract   . Endometrial cancer ([place]) 2016   Grade 1,total laparoscopic hysterectomy  . History of colonoscopy 2014   within normal limits  . History of ovarian cyst 01/2015  . Hyperlipidemia   . Rectal prolapse   . Squamous cell carcinoma    face    Past Surgical History: Past Surgical History:  Procedure Laterality Date  . ABDOMINAL HYSTERECTOMY    . APPENDECTOMY    . BILATERAL SALPINGOOPHORECTOMY  03/30/15  . CATARACT EXTRACTION W/PHACO Right 02/06/2016   Procedure: CATARACT EXTRACTION PHACO AND INTRAOCULAR LENS PLACEMENT (IOC);  Surgeon: Estill Cotta, MD;  Location: [facility]S;  Service: Ophthalmology;  Laterality: Right;  [place] 01:39 AP% 24.9 CDE 47.32 fluid pack lot #  [id number]  . CATARACT EXTRACTION W/PHACO Left 03/05/2016   Procedure: CATARACT EXTRACTION PHACO AND INTRAOCULAR LENS PLACEMENT ([place]);  Surgeon: Estill Cotta, MD;  Location: [facility] ORS;  Service: Ophthalmology;  Laterality: Left;  [place] 01:02 AP% 23.2 CDE 26.73 fluid pack lot # [id number]  . COMBINED HYSTEROSCOPY DIAGNOSTIC / D&C  2016  . DILATION AND CURETTAGE OF UTERUS    . dnc    . LAPAROSCOPIC HYSTERECTOMY  03/30/15  . NASAL SINUS SURGERY    . SKIN CANCER EXCISION     UPPER LIP  . TONSILECTOMY, ADENOIDECTOMY, BILATERAL MYRINGOTOMY AND TUBES    . TONSILLECTOMY      OB History:  OB History  Gravida Para Term Preterm AB Living  0 0 0 0 0 0  SAB TAB Ectopic Multiple Live Births  0 0 0 0    Obstetric Comments  Age at first menarche-age [age]    Family History: Family History  Problem Relation Age of Onset  . Colon cancer Father        older age onset  . COPD Father   . COPD Mother   . Breast cancer Neg Hx   . Ovarian cancer Neg Hx   . Diabetes Neg Hx   . Heart disease Neg Hx     Social History: Social History   Socioeconomic History  . Marital status: Single    Spouse name: Not on file  . Number of children: 0  . Years of education: PhD  . Highest education level: Not on file  Occupational History  . Occupation: Retired  Scientific laboratory technician  . Financial resource strain: Not hard at all  . Food insecurity    Worry: Not on file    Inability: Not on file  . Transportation needs    Medical: No    Non-medical: No  Tobacco Use  . Smoking status: Former Smoker    Packs/day: 0.25    Years: 20.00    Pack years: 5.00    Quit date: 04/13/1981    Years since quitting: 38.2  . Smokeless tobacco: Never Used  Substance and Sexual Activity  . Alcohol use: No  . Drug use: No  . Sexual activity: Not Currently  Lifestyle  . Physical activity    Days per week: 5 days    Minutes per session: 30 min  . Stress: Not at all  Relationships  . Social Herbalist on phone:  Not on file    Gets together: Not on file    Attends religious service: Not on file    Active member of club or organization: Not on file    Attends meetings of clubs or organizations: Not on file    Relationship status: Not on file  . Intimate partner violence    Fear of current or ex partner: Not on file    Emotionally abused: Not on file    Physically abused: Not on file    Forced sexual activity: Not on file  Other Topics Concern  . Not on file  Social History Narrative   Lives in [place]. No children.      Retired [employer]      Diet - regular diet, herbalife  Exercise - none regular, [employer] Classes      Right-handed.      1-2 cups caffeine daily.    Allergies: No Known Allergies  Current Medications: Current Outpatient Medications  Medication Sig Dispense Refill  . atorvastatin (LIPITOR) 40 MG tablet TAKE 1 TABLET(40 MG) BY MOUTH DAILY 90 tablet 1  . cholecalciferol (VITAMIN D) 1000 units tablet Take 1,000 Units by mouth daily.    . DULoxetine (CYMBALTA) 30 MG capsule Take 1 capsule (30 mg total) by mouth daily. 30 capsule 2  . metFORMIN (GLUCOPHAGE) 500 MG tablet TAKE 1 TABLET(500 MG) BY MOUTH DAILY WITH BREAKFAST 90 tablet 0  . Multiple Vitamin (MULTIVITAMIN) tablet Take 1 tablet by mouth daily.    . risperiDONE (RISPERDAL) 1 MG tablet at bedtime.      No current facility-administered medications for this visit.     Review of Systems General:  no complaints Skin: no complaints Eyes: no complaints HEENT: no complaints Breasts: no complaints Pulmonary: no complaints Cardiac: no complaints Gastrointestinal: no complaints Genitourinary/Sexual: no complaints Ob/Gyn: no complaints Musculoskeletal: no complaints Hematology: no complaints Neurologic/Psych: no complaints   Objective:   Vitals:   06/17/19 1103  BP: 139/82  Pulse: 87  Temp: 97.7 F (36.5 C)  TempSrc: Tympanic  Weight: 177 lb 9.6 oz (80.6 kg)  Height: 5\' 6"   (1.676 m)  Body mass index is 28.67 kg/m.  ECOG Performance Status: 0 - Asymptomatic  Physical exam below from 6 months ago.    GENERAL: Patient is a well appearing female in no acute distress HEENT:  Sclera clear. Anicteric NODES:  Negative axillary, supraclavicular, inguinal lymph node survery LUNGS:  Clear to auscultation bilaterally.   HEART:  Regular rate and rhythm.  ABDOMEN:  Soft, nontender.  No hernias, incisions well healed. No masses or ascites EXTREMITIES:  No peripheral edema. Atraumatic. No cyanosis SKIN:  Clear with no obvious rashes or skin changes.  NEURO:  Nonfocal. Well oriented.  Appropriate affect.  Pelvic: EGBUS: normal, Vagina: well healed with some mild radiation changes, no lesions, Bimanual: no masses or nodularity.  RV: confirms.    Assessment:  Pamela Norman is a 78 y.o. female diagnosed with stage IA, grade 1, endometrioid adenocarcinoma of the endometrium S/P TLH/BSO in 4/16 and vaginal brachytherapy due to 4/9 mm invasion and LVSI. NED.   Plan:   Problem List Items Addressed This Visit      Other   History of endometrial cancer - Primary     We discussed COVID-19 precautions.  She lives alone and is sheltering at home appropriately.  We also discussed general [facility] recommendations for identifying COVID-19 risk and what to do if low risk such as handwashing, social distancing, coughing or sneezing into shirt or elbow, and staying at home. Advised patient that if she develops fever, cough, shortness of breath, to contact their healthcare provider before going to clinic and as always contact 911 for medical emergencies.   We will see her in 6 months.  We discussed that she should call to report any concerning symptoms such as pain, discharge, bleeding, or other GU or GI issues.    I discussed the assessment and treatment plan with the patient. The patient was provided an opportunity to ask questions and all were answered. The patient agreed with the  plan and demonstrated an understanding of the instructions.   The patient was advised to call back or seek an in-person evaluation if the symptoms worsen or if the condition  fails to improve as anticipated.  I provided 15 minutes of face-to-face time during this encounter.  Mellody Drown, MD

## 2019-06-17 NOTE — Patient Instructions (Addendum)
Please make an appointment with one of the providers at [facility] in 6 months (on or around January 2021) for surveillance then continue annual/yearly exams thereafter.

## 2019-07-20 ENCOUNTER — Other Ambulatory Visit: Payer: Self-pay | Admitting: Family Medicine

## 2019-08-19 DIAGNOSIS — F060 Psychotic disorder with hallucinations due to known physiological condition: Secondary | ICD-10-CM | POA: Diagnosis not present

## 2019-08-25 ENCOUNTER — Ambulatory Visit (INDEPENDENT_AMBULATORY_CARE_PROVIDER_SITE_OTHER): Payer: Medicare Other | Admitting: Family Medicine

## 2019-08-25 ENCOUNTER — Other Ambulatory Visit: Payer: Self-pay

## 2019-08-25 DIAGNOSIS — J019 Acute sinusitis, unspecified: Secondary | ICD-10-CM

## 2019-08-25 MED ORDER — SALINE SPRAY 0.65 % NA SOLN: 1.0000 | NASAL | 1 refills | Status: DC | PRN

## 2019-08-25 MED ORDER — AMOXICILLIN 500 MG PO CAPS: 500.0000 mg | ORAL_CAPSULE | Freq: Two times a day (BID) | ORAL | 0 refills | Status: DC

## 2019-08-25 NOTE — Progress Notes (Signed)
Patient ID: Pamela Norman, female   DOB: January 26, 1941, 78 y.o.   MRN: [id number]    Virtual Visit via video Note  This visit type was conducted due to national recommendations for restrictions regarding the COVID-19 pandemic (e.g. social distancing).  This format is felt to be most appropriate for this patient at this time.  All issues noted in this document were discussed and addressed.  No physical exam was performed (except for noted visual exam findings with Video Visits).   I connected with Pamela Norman today at  3:40 PM EDT by a video enabled telemedicine application and verified that I am speaking with the correct person using two identifiers. Location patient: home Location provider: work or home office Persons participating in the virtual visit: patient, provider  I discussed the limitations, risks, security and privacy concerns of performing an evaluation and management service by video and the availability of in person appointments. I also discussed with the patient that there may be a patient responsible charge related to this service. The patient expressed understanding and agreed to proceed.   HPI:  Patient connected via video due to complaint of sinus congestion and pressure, pain in teeth some drainage on the back of throat, symptoms have been present for almost 2 weeks.  Denies knowledge of exposure to someone who is been sick with COVID-19.  She stays at home most of the time, when she does go to grocery stores for short time and she is diligent with her handwashing and wearing a mask.  Denies fever or chills.  Denies cough, shortness of breath or wheezing.  Denies body aches.  Denies nausea, vomiting or diarrhea.   ROS: See pertinent positives and negatives per HPI.  Past Medical History:  Diagnosis Date  . Cataract   . Endometrial cancer ([place]) 2016   Grade 1,total laparoscopic hysterectomy  . History of colonoscopy 2014   within normal limits  . History of ovarian cyst  01/2015  . Hyperlipidemia   . Rectal prolapse   . Squamous cell carcinoma    face    Past Surgical History:  Procedure Laterality Date  . ABDOMINAL HYSTERECTOMY    . APPENDECTOMY    . BILATERAL SALPINGOOPHORECTOMY  03/30/15  . CATARACT EXTRACTION W/PHACO Right 02/06/2016   Procedure: CATARACT EXTRACTION PHACO AND INTRAOCULAR LENS PLACEMENT (IOC);  Surgeon: Estill Cotta, MD;  Location: [facility] ORS;  Service: Ophthalmology;  Laterality: Right;  [place] 01:39 AP% 24.9 CDE 47.32 fluid pack lot # [id number]  . CATARACT EXTRACTION W/PHACO Left 03/05/2016   Procedure: CATARACT EXTRACTION PHACO AND INTRAOCULAR LENS PLACEMENT ([place]);  Surgeon: Estill Cotta, MD;  Location: [facility]S;  Service: Ophthalmology;  Laterality: Left;  [place] 01:02 AP% 23.2 CDE 26.73 fluid pack lot # [id number]  . COMBINED HYSTEROSCOPY DIAGNOSTIC / D&C  2016  . DILATION AND CURETTAGE OF UTERUS    . dnc    . LAPAROSCOPIC HYSTERECTOMY  03/30/15  . NASAL SINUS SURGERY    . SKIN CANCER EXCISION     UPPER LIP  . TONSILECTOMY, ADENOIDECTOMY, BILATERAL MYRINGOTOMY AND TUBES    . TONSILLECTOMY      Family History  Problem Relation Age of Onset  . Colon cancer Father        older age onset  . COPD Father   . COPD Mother   . Breast cancer Neg Hx   . Ovarian cancer Neg Hx   . Diabetes Neg Hx   . Heart disease Neg Hx    Social  History   Tobacco Use  . Smoking status: Former Smoker    Packs/day: 0.25    Years: 20.00    Pack years: 5.00    Quit date: 04/13/1981    Years since quitting: 38.3  . Smokeless tobacco: Never Used  Substance Use Topics  . Alcohol use: No    Current Outpatient Medications:  .  atorvastatin (LIPITOR) 40 MG tablet, TAKE 1 TABLET(40 MG) BY MOUTH DAILY, Disp: 90 tablet, Rfl: 1 .  cholecalciferol (VITAMIN D) 1000 units tablet, Take 1,000 Units by mouth daily., Disp: , Rfl:  .  DULoxetine (CYMBALTA) 30 MG capsule, Take 1 capsule (30 mg total) by mouth daily., Disp: 30 capsule, Rfl: 2 .  metFORMIN  (GLUCOPHAGE) 500 MG tablet, TAKE 1 TABLET(500 MG) BY MOUTH DAILY WITH BREAKFAST, Disp: 90 tablet, Rfl: 0 .  Multiple Vitamin (MULTIVITAMIN) tablet, Take 1 tablet by mouth daily., Disp: , Rfl:  .  risperiDONE (RISPERDAL) 1 MG tablet, at bedtime. , Disp: , Rfl:   EXAM:  GENERAL: alert, oriented, appears well and in no acute distress  HEENT: atraumatic, conjunttiva clear, no obvious abnormalities on inspection of external nose and ears  NECK: normal movements of the head and neck  LUNGS: on inspection no signs of respiratory distress, breathing rate appears normal, no obvious gross SOB, gasping or wheezing  CV: no obvious cyanosis  MS: moves all visible extremities without noticeable abnormality  PSYCH/NEURO: pleasant and cooperative, no obvious depression or anxiety, speech and thought processing grossly intact  ASSESSMENT AND PLAN:  Discussed the following assessment and plan:  1. Acute sinusitis, recurrence not specified, unspecified location Patient symptoms do seem consistent with a sinusitis.  We will treat her with amoxicillin twice daily she will use saline nasal spray to help moisten sinuses and wash out congestion.  Advised to keep up good fluid intake, do good handwashing and monitor self for any worsening of symptoms.  Advised if symptoms do not improve as expected to call office and let us know.  - amoxicillin (AMOXIL) 500 MG capsule; Take 1 capsule (500 mg total) by mouth 2 (two) times daily.  Dispense: 14 capsule; Refill: 0 - sodium chloride (OCEAN) 0.65 % SOLN nasal spray; Place 1 spray into both nostrils as needed for congestion.  Dispense: 30 mL; Refill: 1    I discussed the assessment and treatment plan with the patient. The patient was provided an opportunity to ask questions and all were answered. The patient agreed with the plan and demonstrated an understanding of the instructions.   The patient was advised to call back or seek an in-person evaluation if the  symptoms worsen or if the condition fails to improve as anticipated.   [name], FNP

## 2019-10-17 ENCOUNTER — Other Ambulatory Visit: Payer: Self-pay | Admitting: Family Medicine

## 2019-11-09 ENCOUNTER — Other Ambulatory Visit: Payer: Self-pay

## 2019-11-09 ENCOUNTER — Ambulatory Visit (INDEPENDENT_AMBULATORY_CARE_PROVIDER_SITE_OTHER): Payer: Medicare Other

## 2019-11-09 DIAGNOSIS — Z0000 Encounter for general adult medical examination without abnormal findings: Secondary | ICD-10-CM | POA: Diagnosis not present

## 2019-11-09 NOTE — Patient Instructions (Addendum)
  Pamela Norman , Thank you for taking time to come for your Medicare Wellness Visit. I appreciate your ongoing commitment to your health goals. Please review the following plan we discussed and let me know if I can assist you in the future.   These are the goals we discussed: Goals    . Reduce Sugar Intake     Low carb diet Low cholesterol diet       This is a list of the screening recommended for you and due dates:  Health Maintenance  Topic Date Due  . Eye exam for diabetics  01/31/1952  . Tetanus Vaccine  01/30/1961  . Complete foot exam   12/12/2018  . Hemoglobin A1C  05/12/2019  . Flu Shot  07/11/2019  . Urine Protein Check  11/11/2019  . Pneumonia vaccines  Completed  . DEXA scan (bone density measurement)  Discontinued

## 2019-11-09 NOTE — Progress Notes (Signed)
Subjective:   Pamela Norman is a 78 y.o. female who presents for Medicare Annual (Subsequent) preventive examination.  Review of Systems:  No ROS.  Medicare Wellness Virtual Visit.  Visual/audio telehealth visit, UTA vital signs.   See social history for additional risk factors.   Cardiac Risk Factors include: advanced age (>68men, >38 women);diabetes mellitus     Objective:     Vitals: There were no vitals taken for this visit.  There is no height or weight on file to calculate BMI.  Advanced Directives 11/09/2019 03/24/2019 03/22/2019 09/17/2018 08/18/2018 03/19/2018 09/11/2017  Does Patient Have a Medical Advance Directive? No No No No No No No  Does patient want to make changes to medical advance directive? - - - - No - Patient declined - -  Would patient like information on creating a medical advance directive? No - Patient declined No - Patient declined No - Patient declined No - Patient declined - No - Patient declined No - Patient declined    Tobacco Social History   Tobacco Use  Smoking Status Former Smoker  . Packs/day: 0.25  . Years: 20.00  . Pack years: 5.00  . Quit date: 04/13/1981  . Years since quitting: 38.6  Smokeless Tobacco Never Used     Counseling given: Not Answered   Clinical Intake:  Pre-visit preparation completed: Yes        Diabetes: Yes  How often do you need to have someone help you when you read instructions, pamphlets, or other written materials from your doctor or pharmacy?: 1 - Never  Interpreter Needed?: No     Past Medical History:  Diagnosis Date  . Cataract   . Endometrial cancer ([place]) 2016   Grade 1,total laparoscopic hysterectomy  . History of colonoscopy 2014   within normal limits  . History of ovarian cyst 01/2015  . Hyperlipidemia   . Rectal prolapse   . Squamous cell carcinoma    face   Past Surgical History:  Procedure Laterality Date  . ABDOMINAL HYSTERECTOMY    . APPENDECTOMY    . BILATERAL  SALPINGOOPHORECTOMY  03/30/15  . CATARACT EXTRACTION W/PHACO Right 02/06/2016   Procedure: CATARACT EXTRACTION PHACO AND INTRAOCULAR LENS PLACEMENT (IOC);  Surgeon: Estill Cotta, MD;  Location: [facility] ORS;  Service: Ophthalmology;  Laterality: Right;  [place] 01:39 AP% 24.9 CDE 47.32 fluid pack lot # [id number]  . CATARACT EXTRACTION W/PHACO Left 03/05/2016   Procedure: CATARACT EXTRACTION PHACO AND INTRAOCULAR LENS PLACEMENT ([place]);  Surgeon: Estill Cotta, MD;  Location: [facility] ORS;  Service: Ophthalmology;  Laterality: Left;  [place] 01:02 AP% 23.2 CDE 26.73 fluid pack lot # [id number]  . COMBINED HYSTEROSCOPY DIAGNOSTIC / D&C  2016  . DILATION AND CURETTAGE OF UTERUS    . dnc    . LAPAROSCOPIC HYSTERECTOMY  03/30/15  . NASAL SINUS SURGERY    . SKIN CANCER EXCISION     UPPER LIP  . TONSILECTOMY, ADENOIDECTOMY, BILATERAL MYRINGOTOMY AND TUBES    . TONSILLECTOMY     Family History  Problem Relation Age of Onset  . Colon cancer Father        older age onset  . COPD Father   . COPD Mother   . Breast cancer Neg Hx   . Ovarian cancer Neg Hx   . Diabetes Neg Hx   . Heart disease Neg Hx    Social History   Socioeconomic History  . Marital status: Single    Spouse name: Not on file  .  Number of children: 0  . Years of education: PhD  . Highest education level: Not on file  Occupational History  . Occupation: Retired  Scientific laboratory technician  . Financial resource strain: Not hard at all  . Food insecurity    Worry: Never true    Inability: Never true  . Transportation needs    Medical: No    Non-medical: No  Tobacco Use  . Smoking status: Former Smoker    Packs/day: 0.25    Years: 20.00    Pack years: 5.00    Quit date: 04/13/1981    Years since quitting: 38.6  . Smokeless tobacco: Never Used  Substance and Sexual Activity  . Alcohol use: No  . Drug use: No  . Sexual activity: Not Currently  Lifestyle  . Physical activity    Days per week: 5 days    Minutes per session: 30 min  .  Stress: Not at all  Relationships  . Social Herbalist on phone: Not on file    Gets together: Not on file    Attends religious service: Not on file    Active member of club or organization: Not on file    Attends meetings of clubs or organizations: Not on file    Relationship status: Not on file  Other Topics Concern  . Not on file  Social History Narrative   Lives in [place]. No children.      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      1-2 cups caffeine daily.    Outpatient Encounter Medications as of 11/09/2019  Medication Sig  . atorvastatin (LIPITOR) 40 MG tablet TAKE 1 TABLET(40 MG) BY MOUTH DAILY  . cholecalciferol (VITAMIN D) 1000 units tablet Take 1,000 Units by mouth daily.  . DULoxetine (CYMBALTA) 30 MG capsule Take 1 capsule (30 mg total) by mouth daily.  . metFORMIN (GLUCOPHAGE) 500 MG tablet TAKE 1 TABLET(500 MG) BY MOUTH DAILY WITH BREAKFAST  . Multiple Vitamin (MULTIVITAMIN) tablet Take 1 tablet by mouth daily.  . risperiDONE (RISPERDAL) 1 MG tablet at bedtime.   . [DISCONTINUED] amoxicillin (AMOXIL) 500 MG capsule Take 1 capsule (500 mg total) by mouth 2 (two) times daily.  . [DISCONTINUED] sodium chloride (OCEAN) 0.65 % SOLN nasal spray Place 1 spray into both nostrils as needed for congestion.   No facility-administered encounter medications on file as of 11/09/2019.     Activities of Daily Living In your present state of health, do you have any difficulty performing the following activities: 11/09/2019  Hearing? N  Vision? N  Difficulty concentrating or making decisions? N  Walking or climbing stairs? N  Dressing or bathing? N  Doing errands, shopping? N  Preparing Food and eating ? N  Using the Toilet? N  In the past six months, have you accidently leaked urine? N  Do you have problems with loss of bowel control? N  Managing your  Medications? N  Managing your Finances? N  Housekeeping or managing your Housekeeping? N  Some recent data might be hidden    Patient Care Team: Leone Haven, MD as PCP - General (Family Medicine)    Assessment:   This is a routine wellness examination for [place].  Nurse connected with patient 11/09/19 at 12:30 PM EST by a telephone enabled telemedicine application and verified that I  am speaking with the correct person using two identifiers. Patient stated full name and DOB. Patient gave permission to continue with virtual visit. Patient's location was at home and Nurse's location was at [place] office.   Health Maintenance Due: -Tdap- discussed; to be completed with doctor in visit or local pharmacy.   -Dexa Scan- declined  -Mammogram- she plans to schedule -Eye Exam- she plans to schedule -Foot Exam- followed by pcp -Hgb A1c-11/27/18 (6.2) Update all pending maintenance due as appropriate.   See completed HM at the end of note.   Eye: Visual acuity not assessed. Virtual visit. Followed by their ophthalmologist. Retinopathy- none reported  Dental: UTD    Hearing: Demonstrates normal hearing during visit.  Safety:  Patient feels safe at home- yes Patient does have smoke detectors at home- yes Patient does wear sunscreen or protective clothing when in direct sunlight - yes Patient does wear seat belt when in a moving vehicle - yes Patient drives- yes Adequate lighting in walkways free from debris- yes Grab bars and handrails used as appropriate- yes Ambulates with no assistive device Cell phone on person when ambulating outside of the home- yes  Social: Alcohol intake - no     Smoking history- former    Smokers in home? none Illicit drug use? none  Depression: PHQ 2 &9 complete. See screening below. Denies irritability, anhedonia, sadness/tearfullness.    Falls: See screening below.    Medication: Taking as directed and without issues.   Covid-19:  Precautions and sickness symptoms discussed. Wears mask, social distancing, hand hygiene as appropriate.   Activities of Daily Living Patient denies needing assistance with: household chores, feeding themselves, getting from bed to chair, getting to the toilet, bathing/showering, dressing, managing money, or preparing meals.   Memory: Patient is alert. Patient denies difficulty focusing or concentrating. Correctly identified the president of the [place], season and recall.  BMI- discussed the importance of a healthy diet, water intake and the benefits of aerobic exercise.  Educational material provided.  Physical activity- stationary bike 20 bike  Diet:  Regular Water: fair intake  Other Providers Patient Care Team: Leone Haven, MD as PCP - General (Family Medicine) Exercise Activities and Dietary recommendations Current Exercise Habits: Home exercise routine, Type of exercise: calisthenics, Intensity: Mild  Goals    . Reduce Sugar Intake     Low carb diet Low cholesterol diet       Fall Risk Fall Risk  11/09/2019 08/25/2019 08/18/2018 08/15/2017 08/09/2016  Falls in the past year? 0 0 No No Yes  Number falls in past yr: - 0 - - 1  Injury with Fall? - 0 - - Yes  Comment - - - - Stable and followed by PCP  Follow up Education provided;Falls prevention discussed Falls evaluation completed - - Falls prevention discussed;Education provided   Timed Get Up and Go performed: no, virtual visit  Depression Screen PHQ 2/9 Scores 11/09/2019 08/25/2019 08/18/2018 08/15/2017  PHQ - 2 Score 0 0 0 0  PHQ- 9 Score - 0 - 0     Cognitive Function MMSE - Mini Mental State Exam 08/18/2018 08/15/2017 08/09/2016  Orientation to time 5 5 5   Orientation to Place 5 5 5   Registration 3 3 3   Attention/ Calculation 5 5 5   Recall 3 3 3   Language- name 2 objects 2 2 2   Language- repeat 1 1 1   Language- follow 3 step command 3 3 3   Language- read & follow direction 1 1 1  Write a sentence 1 1 1   Copy  design 1 1 1   Total score 30 30 30      6CIT Screen 11/09/2019  What Year? 0 points  What month? 0 points  What time? 0 points  Count back from 20 0 points  Months in reverse 0 points  Repeat phrase 0 points  Total Score 0    Immunization History  Administered Date(s) Administered  . Influenza, High Dose Seasonal PF 09/04/2018  . Influenza,inj,Quad PF,6+ Mos 10/05/2019  . Influenza-Unspecified 01/24/2016, 08/16/2016, 08/10/2018  . Pneumococcal Conjugate-13 05/21/2014  . Pneumococcal Polysaccharide-23 08/09/2016  . Zoster Recombinat (Shingrix) 03/22/2017   Screening Tests Health Maintenance  Topic Date Due  . OPHTHALMOLOGY EXAM  01/31/1951  . TETANUS/TDAP  01/30/1961  . FOOT EXAM  12/12/2018  . HEMOGLOBIN A1C  05/12/2019  . INFLUENZA VACCINE  07/11/2019  . URINE MICROALBUMIN  11/11/2019  . PNA vac Low Risk Adult  Completed  . DEXA SCAN  Discontinued      Plan:   Keep all routine maintenance appointments.   Follow up 11/11/19  Medicare Attestation I have personally reviewed: The patient's medical and social history Their use of alcohol, tobacco or illicit drugs Their current medications and supplements The patient's functional ability including ADLs,fall risks, home safety risks, cognitive, and hearing and visual impairment Diet and physical activities Evidence for depression   In addition, I have reviewed and discussed with patient certain preventive protocols, quality metrics, and best practice recommendations. A written personalized care plan for preventive services as well as general preventive health recommendations were provided to patient via mail.     Varney Biles, LPN  [id number]

## 2019-11-11 ENCOUNTER — Encounter: Payer: Self-pay | Admitting: Family Medicine

## 2019-11-11 ENCOUNTER — Ambulatory Visit: Payer: Medicare Other

## 2019-11-11 ENCOUNTER — Ambulatory Visit (INDEPENDENT_AMBULATORY_CARE_PROVIDER_SITE_OTHER): Payer: Medicare Other | Admitting: Family Medicine

## 2019-11-11 ENCOUNTER — Other Ambulatory Visit: Payer: Self-pay

## 2019-11-11 VITALS — Ht 66.0 in | Wt 172.0 lb

## 2019-11-11 DIAGNOSIS — E785 Hyperlipidemia, unspecified: Secondary | ICD-10-CM | POA: Diagnosis not present

## 2019-11-11 DIAGNOSIS — E119 Type 2 diabetes mellitus without complications: Secondary | ICD-10-CM

## 2019-11-11 DIAGNOSIS — Z8542 Personal history of malignant neoplasm of other parts of uterus: Secondary | ICD-10-CM

## 2019-11-11 NOTE — Assessment & Plan Note (Addendum)
She will come in for lab work.  Continue Metformin.  Given the COVID-19 pandemic it is reasonable to defer her ophthalmology visit.

## 2019-11-11 NOTE — Progress Notes (Signed)
Virtual Visit via telephone Note  This visit type was conducted due to national recommendations for restrictions regarding the COVID-19 pandemic (e.g. social distancing).  This format is felt to be most appropriate for this patient at this time.  All issues noted in this document were discussed and addressed.  No physical exam was performed (except for noted visual exam findings with Video Visits).   I connected with Alain Marion today at 11:00 AM EST by telephone and verified that I am speaking with the correct person using two identifiers. Location patient: home Location provider: work Persons participating in the virtual visit: patient, provider  I discussed the limitations, risks, security and privacy concerns of performing an evaluation and management service by telephone and the availability of in person appointments. I also discussed with the patient that there may be a patient responsible charge related to this service. The patient expressed understanding and agreed to proceed.  Interactive audio and video telecommunications were attempted between this provider and patient, however failed, due to patient having technical difficulties OR patient did not have access to video capability.  We continued and completed visit with audio only.   Reason for visit: follow-up  HPI: Diabetes: Not checking sugars.  Taking metformin.  No polyuria polydipsia.  No hypoglycemia.  Has not seen an ophthalmologist due to the COVID-19 pandemic.  She notes no changes in her vision.  Hyperlipidemia: Taking Lipitor.  No chest pain, right upper quadrant pain, or myalgias.  Endometrial cancer history: She continues to follow with oncology and gynecology.  She notes no bleeding or vaginal discharge.   ROS: See pertinent positives and negatives per HPI.  Past Medical History:  Diagnosis Date  . Cataract   . Endometrial cancer ([place]) 2016   Grade 1,total laparoscopic hysterectomy  . History of colonoscopy  2014   within normal limits  . History of ovarian cyst 01/2015  . Hyperlipidemia   . Rectal prolapse   . Squamous cell carcinoma    face    Past Surgical History:  Procedure Laterality Date  . ABDOMINAL HYSTERECTOMY    . APPENDECTOMY    . BILATERAL SALPINGOOPHORECTOMY  03/30/15  . CATARACT EXTRACTION W/PHACO Right 02/06/2016   Procedure: CATARACT EXTRACTION PHACO AND INTRAOCULAR LENS PLACEMENT (IOC);  Surgeon: Estill Cotta, MD;  Location: [facility] ORS;  Service: Ophthalmology;  Laterality: Right;  [place] 01:39 AP% 24.9 CDE 47.32 fluid pack lot # [id number]  . CATARACT EXTRACTION W/PHACO Left 03/05/2016   Procedure: CATARACT EXTRACTION PHACO AND INTRAOCULAR LENS PLACEMENT ([place]);  Surgeon: Estill Cotta, MD;  Location: [facility] ORS;  Service: Ophthalmology;  Laterality: Left;  [place] 01:02 AP% 23.2 CDE 26.73 fluid pack lot # [id number]  . COMBINED HYSTEROSCOPY DIAGNOSTIC / D&C  2016  . DILATION AND CURETTAGE OF UTERUS    . dnc    . LAPAROSCOPIC HYSTERECTOMY  03/30/15  . NASAL SINUS SURGERY    . SKIN CANCER EXCISION     UPPER LIP  . TONSILECTOMY, ADENOIDECTOMY, BILATERAL MYRINGOTOMY AND TUBES    . TONSILLECTOMY      Family History  Problem Relation Age of Onset  . Colon cancer Father        older age onset  . COPD Father   . COPD Mother   . Breast cancer Neg Hx   . Ovarian cancer Neg Hx   . Diabetes Neg Hx   . Heart disease Neg Hx     SOCIAL HX: Former smoker   Current Outpatient Medications:  .  atorvastatin (LIPITOR) 40 MG tablet, TAKE 1 TABLET(40 MG) BY MOUTH DAILY, Disp: 90 tablet, Rfl: 1 .  cholecalciferol (VITAMIN D) 1000 units tablet, Take 1,000 Units by mouth daily., Disp: , Rfl:  .  DULoxetine (CYMBALTA) 30 MG capsule, Take 1 capsule (30 mg total) by mouth daily., Disp: 30 capsule, Rfl: 2 .  metFORMIN (GLUCOPHAGE) 500 MG tablet, TAKE 1 TABLET(500 MG) BY MOUTH DAILY WITH BREAKFAST, Disp: 90 tablet, Rfl: 0 .  Multiple Vitamin (MULTIVITAMIN) tablet, Take 1 tablet by mouth  daily., Disp: , Rfl:  .  risperiDONE (RISPERDAL) 1 MG tablet, at bedtime. , Disp: , Rfl:   EXAM: This is a telehealth telephone visit and thus no physical exam was completed.  ASSESSMENT AND PLAN:  Discussed the following assessment and plan:  Diabetes mellitus without complication ([place]) She will come in for lab work.  Continue Metformin.  Given the COVID-19 pandemic it is reasonable to defer her ophthalmology visit.  History of endometrial cancer She will continue to follow with her specialists.  Hyperlipidemia Continue Lipitor.  Labs to be completed.    I discussed the assessment and treatment plan with the patient. The patient was provided an opportunity to ask questions and all were answered. The patient agreed with the plan and demonstrated an understanding of the instructions.   The patient was advised to call back or seek an in-person evaluation if the symptoms worsen or if the condition fails to improve as anticipated.  I provided 7 minutes of non-face-to-face time during this encounter.   Tommi Rumps, MD

## 2019-11-11 NOTE — Assessment & Plan Note (Signed)
She will continue to follow with her specialists.

## 2019-11-11 NOTE — Assessment & Plan Note (Signed)
Continue Lipitor.  Labs to be completed.

## 2019-11-19 ENCOUNTER — Other Ambulatory Visit (INDEPENDENT_AMBULATORY_CARE_PROVIDER_SITE_OTHER): Payer: Medicare Other

## 2019-11-19 ENCOUNTER — Other Ambulatory Visit: Payer: Self-pay

## 2019-11-19 DIAGNOSIS — E119 Type 2 diabetes mellitus without complications: Secondary | ICD-10-CM | POA: Diagnosis not present

## 2019-11-19 DIAGNOSIS — E785 Hyperlipidemia, unspecified: Secondary | ICD-10-CM

## 2019-11-19 LAB — COMPREHENSIVE METABOLIC PANEL
ALT: 12 U/L (ref 0–35)
AST: 9 U/L (ref 0–37)
Albumin: 3.8 g/dL (ref 3.5–5.2)
Alkaline Phosphatase: 122 U/L — ABNORMAL HIGH (ref 39–117)
BUN: 16 mg/dL (ref 6–23)
CO2: 30 mEq/L (ref 19–32)
Calcium: 9.4 mg/dL (ref 8.4–10.5)
Chloride: 101 mEq/L (ref 96–112)
Creatinine, Ser: 0.77 mg/dL (ref 0.40–1.20)
GFR: 72.54 mL/min (ref >60.00)
Glucose, Bld: 146 mg/dL — ABNORMAL HIGH (ref 70–99)
Potassium: 4.2 mEq/L (ref 3.5–5.1)
Sodium: 141 mEq/L (ref 135–145)
Total Bilirubin: 0.5 mg/dL (ref 0.2–1.2)
Total Protein: 7 g/dL (ref 6.0–8.3)

## 2019-11-19 LAB — LIPID PANEL
Cholesterol: 152 mg/dL (ref 0–200)
HDL: 33 mg/dL — ABNORMAL LOW (ref >39.00)
LDL Cholesterol: 90 mg/dL (ref 0–99)
NonHDL: 119.21
Total CHOL/HDL Ratio: 5
Triglycerides: 145 mg/dL (ref 0.0–149.0)
VLDL: 29 mg/dL (ref 0.0–40.0)

## 2019-11-19 LAB — HEMOGLOBIN A1C: Hgb A1c MFr Bld: 6.6 % — ABNORMAL HIGH (ref 4.6–6.5)

## 2019-12-10 ENCOUNTER — Other Ambulatory Visit: Payer: Self-pay | Admitting: Family Medicine

## 2019-12-15 ENCOUNTER — Other Ambulatory Visit: Payer: Self-pay

## 2019-12-15 NOTE — Progress Notes (Signed)
Called patient no answer left message  

## 2019-12-16 ENCOUNTER — Other Ambulatory Visit: Payer: Self-pay

## 2019-12-16 ENCOUNTER — Inpatient Hospital Stay: Payer: Medicare Other | Attending: Obstetrics and Gynecology | Admitting: Obstetrics and Gynecology

## 2019-12-16 VITALS — BP 135/72 | HR 96 | Temp 97.7°F | Wt 173.8 lb

## 2019-12-16 DIAGNOSIS — Z7984 Long term (current) use of oral hypoglycemic drugs: Secondary | ICD-10-CM | POA: Insufficient documentation

## 2019-12-16 DIAGNOSIS — Z87891 Personal history of nicotine dependence: Secondary | ICD-10-CM | POA: Diagnosis not present

## 2019-12-16 DIAGNOSIS — M8580 Other specified disorders of bone density and structure, unspecified site: Secondary | ICD-10-CM | POA: Diagnosis not present

## 2019-12-16 DIAGNOSIS — E1136 Type 2 diabetes mellitus with diabetic cataract: Secondary | ICD-10-CM | POA: Diagnosis not present

## 2019-12-16 DIAGNOSIS — Z90722 Acquired absence of ovaries, bilateral: Secondary | ICD-10-CM

## 2019-12-16 DIAGNOSIS — E559 Vitamin D deficiency, unspecified: Secondary | ICD-10-CM | POA: Insufficient documentation

## 2019-12-16 DIAGNOSIS — E785 Hyperlipidemia, unspecified: Secondary | ICD-10-CM | POA: Diagnosis not present

## 2019-12-16 DIAGNOSIS — Z08 Encounter for follow-up examination after completed treatment for malignant neoplasm: Secondary | ICD-10-CM | POA: Insufficient documentation

## 2019-12-16 DIAGNOSIS — Z8542 Personal history of malignant neoplasm of other parts of uterus: Secondary | ICD-10-CM | POA: Diagnosis not present

## 2019-12-16 DIAGNOSIS — R030 Elevated blood-pressure reading, without diagnosis of hypertension: Secondary | ICD-10-CM | POA: Diagnosis not present

## 2019-12-16 DIAGNOSIS — Z79899 Other long term (current) drug therapy: Secondary | ICD-10-CM | POA: Insufficient documentation

## 2019-12-16 DIAGNOSIS — Z923 Personal history of irradiation: Secondary | ICD-10-CM | POA: Diagnosis not present

## 2019-12-16 DIAGNOSIS — Z90710 Acquired absence of both cervix and uterus: Secondary | ICD-10-CM | POA: Diagnosis not present

## 2019-12-16 NOTE — Patient Instructions (Signed)
Please contact your PCP to schedule a mammogram, bone density scan, and colonoscopy. Please consider covid-19 vaccination.

## 2019-12-16 NOTE — Progress Notes (Signed)
Gynecologic Oncology Interval Note  Referring Provider: Dr. Malachi Paradise (retired)/Dr. Marcelline Mates  Chief Concern: Endometrial cancer surveillance  Subjective:  Pamela Norman is a 79 y.o. female with history of stage Ia grade 1 endometrial cancer s/p TLH BSO with SLN mapping followed by vaginal brachytherapy for LVSI completed 09/20/2015, who returns to clinic today for continued surveillance.   Today, she says that she feels well and denies specific complaints.  No changes in bowel movements, constipation or diarrhea, pain, bleeding. She participated in the virtual senior games and won 3 medals.   Last mammogram was 08/19/2018 reported as bi-rads category 2. Repeat 08/2019.  Last bone density was 08/19/2018 consistent with osteopenia. Arranged through PCP. Due 08/2020.  Last colonoscopy in 07/16/2013 with Dr. Gustavo Lah. Recommended repeating in 2019.   She has established care with Dr. Marcelline Mates and is scheduled to see her in 6 months.    HPI:  Please refer to prior notes for complete details. On 03/30/15 she underwent total laparoscopic hysterectomy, bilateral salpingo-oophorectomy, pelvic sentinel lymph node mapping, and repair of vaginal lacerations caused by removal of the uterus transvaginally.  Her final pathology as noted below.  Sentinel nodes were not identified and node dissection not done since frozen showed grade 1 tumor invading less than 50% (4/9 mm).  Since final path showed LVSI she was referred to Dr. Baruch Gouty for vaginal brachytherapy. She tolerated 6 HDR treatments well and completed on 09/20/2015.   DIAGNOSIS:  A. UTERUS; HYSTERECTOMY:  - ENDOMETRIOID CARCINOMA WITH SQUAMOUS DIFFERENTIATION, GRADE 1, INVADING LESS THAN ONE HALF OF MYOMETRIUM.  - CERVIX NOT INVOLVED.  - INTRAMURAL LEIOMYOMA.   RIGHT AND LEFT FALLOPIAN TUBES AND OVARIES; SALPINGO-OOPHORECTOMY:  - NOT INVOLVED.  - BENIGN SIMPLE CYST OF RIGHT OVARY, 3.2 CM.  TUMOR  Histologic Type:  Endometrioid  adenocarcinoma, variant (specify) with squamous differentiation  Histologic Grade:  FIGO grade 1   EXTENT:  Tumor Size: 3.5cm at Greatest dimension (cm)  Myometrial Invasion: Present  Depth of Myometrial Invasion: Specify depth of invasion (mm) - 36mm  Myometrial Thickness (mm):  15mm  Involvement of Cervix:  Not involved  Other Organs Submitted:  Right ovary - Not involved  Left ovary - Not involved  Right fallopian tube  - Not involved  Left fallopian tube - Not involved   ACCESSORY FINDINGS  Lymph-Vascular Invasion: Present   STAGE (pTNM [FIGO])  Primary Tumor (pT):  pT1a [IA]: Tumor limited to endometrium or invades less than one-half  of the myometrium  Regional Lymph Nodes (pN)  pNX: Cannot be assessed  Pelvic Lymph Nodes: No pelvic nodes submitted or found  Para-aortic Lymph Nodes: No para-aortic nodes submitted or found  Distant Metastasis (pM): Not applicable   Cytology: DIAGNOSIS:  A. PELVIC WASHINGS:  - NEGATIVE FOR MALIGNANCY.    Problem List: Patient Active Problem List   Diagnosis Date Noted  . Overweight 05/13/2019  . Skin lesions 12/12/2017  . Elevated blood pressure reading 12/12/2017  . Diabetes mellitus without complication ([place]) 99991111  . History of psychosis 09/17/2016  . Osteopenia 09/17/2016  . Vitamin D deficiency 09/17/2016  . History of endometrial cancer 05/04/2015  . Hyperlipidemia 05/21/2014    Past Medical History: Past Medical History:  Diagnosis Date  . Cataract   . Endometrial cancer ([place]) 2016   Grade 1,total laparoscopic hysterectomy  . History of colonoscopy 2014   within normal limits  . History of ovarian cyst 01/2015  . Hyperlipidemia   . Rectal prolapse   . Squamous cell  carcinoma    face    Past Surgical History: Past Surgical History:  Procedure Laterality Date  . ABDOMINAL HYSTERECTOMY    . APPENDECTOMY    . BILATERAL SALPINGOOPHORECTOMY  03/30/15  . CATARACT EXTRACTION W/PHACO Right 02/06/2016    Procedure: CATARACT EXTRACTION PHACO AND INTRAOCULAR LENS PLACEMENT (IOC);  Surgeon: Estill Cotta, MD;  Location: [facility] ORS;  Service: Ophthalmology;  Laterality: Right;  [place] 01:39 AP% 24.9 CDE 47.32 fluid pack lot # [id number]  . CATARACT EXTRACTION W/PHACO Left 03/05/2016   Procedure: CATARACT EXTRACTION PHACO AND INTRAOCULAR LENS PLACEMENT ([place]);  Surgeon: Estill Cotta, MD;  Location: [facility] ORS;  Service: Ophthalmology;  Laterality: Left;  [place] 01:02 AP% 23.2 CDE 26.73 fluid pack lot # [id number]  . COMBINED HYSTEROSCOPY DIAGNOSTIC / D&C  2016  . DILATION AND CURETTAGE OF UTERUS    . dnc    . LAPAROSCOPIC HYSTERECTOMY  03/30/15  . NASAL SINUS SURGERY    . SKIN CANCER EXCISION     UPPER LIP  . TONSILECTOMY, ADENOIDECTOMY, BILATERAL MYRINGOTOMY AND TUBES    . TONSILLECTOMY      OB History:  OB History  Gravida Para Term Preterm AB Living  0 0 0 0 0 0  SAB TAB Ectopic Multiple Live Births  0 0 0 0    Obstetric Comments  Age at first menarche-age [age]    Family History: Family History  Problem Relation Age of Onset  . Colon cancer Father        older age onset  . COPD Father   . COPD Mother   . Breast cancer Neg Hx   . Ovarian cancer Neg Hx   . Diabetes Neg Hx   . Heart disease Neg Hx     Social History: Social History   Socioeconomic History  . Marital status: Single    Spouse name: Not on file  . Number of children: 0  . Years of education: PhD  . Highest education level: Not on file  Occupational History  . Occupation: Retired  Tobacco Use  . Smoking status: Former Smoker    Packs/day: 0.25    Years: 20.00    Pack years: 5.00    Quit date: 04/13/1981    Years since quitting: 38.7  . Smokeless tobacco: Never Used  Substance and Sexual Activity  . Alcohol use: No  . Drug use: No  . Sexual activity: Not Currently  Other Topics Concern  . Not on file  Social History Narrative   Lives in [place]. No children.      Retired [employer]      Diet -  regular diet, herbalife      Exercise - none regular, [employer]      Right-handed.      1-2 cups caffeine daily.   Social Determinants of Health   Financial Resource Strain:   . Difficulty of Paying Living Expenses: Not on file  Food Insecurity: No Food Insecurity  . Worried About Charity fundraiser in the Last Year: Never true  . Ran Out of Food in the Last Year: Never true  Transportation Needs:   . Lack of Transportation (Medical): Not on file  . Lack of Transportation (Non-Medical): Not on file  Physical Activity:   . Days of Exercise per Week: Not on file  . Minutes of Exercise per Session: Not on file  Stress:   . Feeling of Stress : Not on file  Social Connections:   . Frequency  of Communication with Friends and Family: Not on file  . Frequency of Social Gatherings with Friends and Family: Not on file  . Attends Religious Services: Not on file  . Active Member of Clubs or Organizations: Not on file  . Attends Archivist Meetings: Not on file  . Marital Status: Not on file  Intimate Partner Violence:   . Fear of Current or Ex-Partner: Not on file  . Emotionally Abused: Not on file  . Physically Abused: Not on file  . Sexually Abused: Not on file    Allergies: No Known Allergies  Current Medications: Current Outpatient Medications  Medication Sig Dispense Refill  . atorvastatin (LIPITOR) 40 MG tablet TAKE 1 TABLET(40 MG) BY MOUTH DAILY 90 tablet 1  . cholecalciferol (VITAMIN D) 1000 units tablet Take 1,000 Units by mouth daily.    . DULoxetine (CYMBALTA) 30 MG capsule Take 1 capsule (30 mg total) by mouth daily. 30 capsule 2  . metFORMIN (GLUCOPHAGE) 500 MG tablet TAKE 1 TABLET(500 MG) BY MOUTH DAILY WITH BREAKFAST 90 tablet 0  . Multiple Vitamin (MULTIVITAMIN) tablet Take 1 tablet by mouth daily.    . risperiDONE (RISPERDAL) 1 MG tablet at bedtime.      No current facility-administered medications for this visit.    Review of  Systems General:  no complaints Skin: no complaints Eyes: no complaints HEENT: no complaints Breasts: no complaints Pulmonary: no complaints Cardiac: no complaints Gastrointestinal: no complaints Genitourinary/Sexual: no complaints Ob/Gyn: no complaints Musculoskeletal: no complaints Hematology: no complaints Neurologic/Psych: no complaints   Objective:   Vitals:   12/16/19 1049  BP: 135/72  Pulse: 96  Temp: 97.7 F (36.5 C)  TempSrc: Tympanic  SpO2: 100%  Weight: 173 lb 12.8 oz (78.8 kg)  Body mass index is 28.05 kg/m.  ECOG Performance Status: 0 - Asymptomatic  GENERAL: Patient is a well appearing female in no acute distress HEENT:  Sclera clear. Anicteric NODES:  Negative axillary, supraclavicular, inguinal lymph node survery LUNGS:  Clear to auscultation bilaterally.   HEART:  Regular rate and rhythm.  ABDOMEN:  Soft, nontender.  No hernias, incisions well healed. No masses or ascites EXTREMITIES:  No peripheral edema. Atraumatic. No cyanosis SKIN:  Clear with no obvious rashes or skin changes.  NEURO:  Nonfocal. Well oriented.  Appropriate affect.  Pelvic: EGBUS: normal, Vagina: well healed with some mild radiation changes, no lesions; Cervix/Uterus: surgically absent. Bimanual: no masses or nodularity.  RV: deferred.     Assessment:  Pamela Norman is a 79 y.o. female diagnosed with stage IA, grade 1, endometrioid adenocarcinoma of the endometrium S/P TLH/BSO in 4/16 and vaginal brachytherapy due to 4/9 mm invasion and LVSI. NED.   Plan:   Problem List Items Addressed This Visit      Other   History of endometrial cancer - Primary     We previously discussed COVID-19 precautions.    She will see Dr. Marcelline Mates in 6 months and she will see [place] in 12 months. We reviewed alternating visits with Dr. Marcelline Mates and our team. After her next visit she will be released from our clinic.    I discussed the assessment and treatment plan with the patient. The patient was  provided an opportunity to ask questions and all were answered. The patient agreed with the plan and demonstrated an understanding of the instructions.   The patient was advised to call back or seek an in-person evaluation if the symptoms worsen or if the condition fails to  improve as anticipated.  A total of 25 minutes were spent with the patient/family today; >50% was spent in education, counseling and coordination of care for endometrial cancer.  I personally had a face to face interaction and evaluated the patient jointly with the NP, Ms. Beckey Rutter.  I have reviewed her history and available records and have performed the key portions of the physical exam including lymph node survey, abdominal exam, pelvic exam with my findings confirming those documented above by the APP.  I have discussed the case with the APP and the patient.  I agree with the above documentation, assessment and plan which was fully formulated by me.  Counseling was completed by me.   I personally saw the patient and performed a substantive portion of this encounter in conjunction with the listed APP as documented above.  Advaith Lamarque Gaetana Michaelis, MD

## 2019-12-16 NOTE — Progress Notes (Signed)
Pt participated in virtual senior games in September and received 3 medals.  Pt denies any concerns today.

## 2020-01-11 ENCOUNTER — Other Ambulatory Visit: Payer: Self-pay

## 2020-01-12 ENCOUNTER — Telehealth: Payer: Self-pay | Admitting: *Deleted

## 2020-01-12 DIAGNOSIS — E785 Hyperlipidemia, unspecified: Secondary | ICD-10-CM

## 2020-01-12 NOTE — Telephone Encounter (Signed)
Please place future orders for lab appt.  

## 2020-01-12 NOTE — Telephone Encounter (Signed)
She said she did for a few days but then went back to 40mg . She said she improved her diet.   Pamela Norman,cma

## 2020-01-12 NOTE — Telephone Encounter (Signed)
She said she did for a few days but then went back to 40mg . She said she improved her diet.

## 2020-01-12 NOTE — Telephone Encounter (Signed)
Called and left a voicemail for the patient to return a call back to ask if she had increased her Lipitor to 80 mg from December.  Parthena Fergeson,cma

## 2020-01-12 NOTE — Telephone Encounter (Signed)
Labs ordered.  Please contact the patient and see if she increased her Lipitor to 80 mg as previously advised in the result note from December.

## 2020-01-13 ENCOUNTER — Other Ambulatory Visit: Payer: Self-pay

## 2020-01-13 ENCOUNTER — Other Ambulatory Visit (INDEPENDENT_AMBULATORY_CARE_PROVIDER_SITE_OTHER): Payer: Medicare Other

## 2020-01-13 DIAGNOSIS — E785 Hyperlipidemia, unspecified: Secondary | ICD-10-CM

## 2020-01-13 LAB — LDL CHOLESTEROL, DIRECT: Direct LDL: 73 mg/dL

## 2020-01-13 LAB — HEPATIC FUNCTION PANEL
ALT: 13 U/L (ref 0–35)
AST: 13 U/L (ref 0–37)
Albumin: 4.2 g/dL (ref 3.5–5.2)
Alkaline Phosphatase: 121 U/L — ABNORMAL HIGH (ref 39–117)
Bilirubin, Direct: 0.1 mg/dL (ref 0.0–0.3)
Total Bilirubin: 0.5 mg/dL (ref 0.2–1.2)
Total Protein: 7.6 g/dL (ref 6.0–8.3)

## 2020-01-15 ENCOUNTER — Other Ambulatory Visit: Payer: Self-pay | Admitting: Family Medicine

## 2020-02-07 ENCOUNTER — Other Ambulatory Visit: Payer: Self-pay | Admitting: Family Medicine

## 2020-02-07 DIAGNOSIS — R748 Abnormal levels of other serum enzymes: Secondary | ICD-10-CM

## 2020-03-21 ENCOUNTER — Other Ambulatory Visit (INDEPENDENT_AMBULATORY_CARE_PROVIDER_SITE_OTHER): Payer: Medicare Other

## 2020-03-21 ENCOUNTER — Other Ambulatory Visit: Payer: Self-pay

## 2020-03-21 DIAGNOSIS — R748 Abnormal levels of other serum enzymes: Secondary | ICD-10-CM | POA: Diagnosis not present

## 2020-03-26 LAB — ALKALINE PHOSPHATASE, ISOENZYMES
Alkaline Phosphatase: 100 IU/L (ref 39–117)
BONE FRACTION: 14 % (ref 14–68)
INTESTINAL FRAC.: 0 % (ref 0–18)
LIVER FRACTION: 86 % — ABNORMAL HIGH (ref 18–85)

## 2020-04-15 ENCOUNTER — Other Ambulatory Visit: Payer: Self-pay | Admitting: Family Medicine

## 2020-05-11 ENCOUNTER — Other Ambulatory Visit: Payer: Self-pay

## 2020-05-13 ENCOUNTER — Other Ambulatory Visit: Payer: Self-pay

## 2020-05-13 ENCOUNTER — Encounter: Payer: Self-pay | Admitting: Family Medicine

## 2020-05-13 ENCOUNTER — Ambulatory Visit (INDEPENDENT_AMBULATORY_CARE_PROVIDER_SITE_OTHER): Payer: Medicare Other | Admitting: Family Medicine

## 2020-05-13 VITALS — BP 120/80 | HR 88 | Temp 96.9°F | Ht 66.0 in | Wt 171.2 lb

## 2020-05-13 DIAGNOSIS — E119 Type 2 diabetes mellitus without complications: Secondary | ICD-10-CM

## 2020-05-13 DIAGNOSIS — Z111 Encounter for screening for respiratory tuberculosis: Secondary | ICD-10-CM | POA: Diagnosis not present

## 2020-05-13 DIAGNOSIS — E785 Hyperlipidemia, unspecified: Secondary | ICD-10-CM | POA: Diagnosis not present

## 2020-05-13 NOTE — Patient Instructions (Signed)
Nice to see you. Please work on diet and exercise. Have included dietary guidelines below. We will contact you with your lab results. We will send in your form once your lab result returns. Please see an eye doctor for yearly eye exam.

## 2020-05-13 NOTE — Progress Notes (Signed)
  Tommi Rumps, MD Phone: [phone]  Pamela Norman is a 79 y.o. female who presents today for f/u.  DIABETES Disease Monitoring: Blood Sugar ranges-not checking Polyuria/phagia/dipsia- no      Optho- due Medications: Compliance- taking metformin Hypoglycemic symptoms- no  HYPERLIPIDEMIA Symptoms Chest pain on exertion:  no   Medications: Compliance- taking lipitor Right upper quadrant pain- no  Muscle aches- no  Patient notes she is going to be moving into a independent living community and has a form that needs to be filled out. She needs a TB test for this.     Social History   Tobacco Use  Smoking Status Former Smoker  . Packs/day: 0.25  . Years: 20.00  . Pack years: 5.00  . Quit date: 04/13/1981  . Years since quitting: 39.1  Smokeless Tobacco Never Used     ROS see history of present illness  Objective  Physical Exam Vitals:   05/13/20 1112  BP: 120/80  Pulse: 88  Temp: (!) 96.9 F (36.1 C)  SpO2: 97%    BP Readings from Last 3 Encounters:  05/13/20 120/80  12/16/19 135/72  06/17/19 139/82   Wt Readings from Last 3 Encounters:  05/13/20 171 lb 3.2 oz (77.7 kg)  12/16/19 173 lb 12.8 oz (78.8 kg)  11/11/19 172 lb (78 kg)    Physical Exam Constitutional:      General: She is not in acute distress.    Appearance: She is not diaphoretic.  Cardiovascular:     Rate and Rhythm: Normal rate and regular rhythm.     Heart sounds: Normal heart sounds.  Pulmonary:     Effort: Pulmonary effort is normal.     Breath sounds: Normal breath sounds.  Skin:    General: Skin is warm and dry.  Neurological:     Mental Status: She is alert.     Comments: No tremor, 2+ patellar, brachioradialis, and biceps reflexes, negative Romberg, no pronator drift, good balance, normal gait      Assessment/Plan: Please see individual problem list.  Diabetes mellitus without complication ([place]) Patient declines lab work today. She would like to have this checked  in 3 months after working on diet and exercise. We will have her follow-up in 3 months.  Hyperlipidemia Continue Lipitor. Plan for labs in 3 months at patient request.  Screening-pulmonary TB QuantiFERON gold for screening for her facility.    Orders Placed This Encounter  Procedures  . QuantiFERON-TB Gold Plus    No orders of the defined types were placed in this encounter.   This visit occurred during the SARS-CoV-2 public health emergency.  Safety protocols were in place, including screening questions prior to the visit, additional usage of staff PPE, and extensive cleaning of exam room while observing appropriate contact time as indicated for disinfecting solutions.    Tommi Rumps, MD [place]

## 2020-05-13 NOTE — Assessment & Plan Note (Signed)
QuantiFERON gold for screening for her facility.

## 2020-05-13 NOTE — Assessment & Plan Note (Signed)
Patient declines lab work today. She would like to have this checked in 3 months after working on diet and exercise. We will have her follow-up in 3 months.

## 2020-05-13 NOTE — Assessment & Plan Note (Signed)
Continue Lipitor. Plan for labs in 3 months at patient request.

## 2020-05-15 LAB — QUANTIFERON-TB GOLD PLUS
Mitogen-NIL: >10 IU/mL
NIL: 0.02 IU/mL
QuantiFERON-TB Gold Plus: NEGATIVE
TB1-NIL: 0 IU/mL
TB2-NIL: 0.01 IU/mL

## 2020-06-12 ENCOUNTER — Other Ambulatory Visit: Payer: Self-pay | Admitting: Family Medicine

## 2020-06-21 ENCOUNTER — Ambulatory Visit (INDEPENDENT_AMBULATORY_CARE_PROVIDER_SITE_OTHER): Payer: Medicare Other | Admitting: Obstetrics and Gynecology

## 2020-06-21 ENCOUNTER — Other Ambulatory Visit: Payer: Self-pay

## 2020-06-21 ENCOUNTER — Encounter: Payer: Self-pay | Admitting: Obstetrics and Gynecology

## 2020-06-21 VITALS — BP 148/82 | HR 103 | Ht 66.0 in | Wt 175.0 lb

## 2020-06-21 DIAGNOSIS — Z1211 Encounter for screening for malignant neoplasm of colon: Secondary | ICD-10-CM

## 2020-06-21 DIAGNOSIS — Z1231 Encounter for screening mammogram for malignant neoplasm of breast: Secondary | ICD-10-CM

## 2020-06-21 DIAGNOSIS — Z8542 Personal history of malignant neoplasm of other parts of uterus: Secondary | ICD-10-CM

## 2020-06-21 NOTE — Progress Notes (Signed)
Pt present for follow up for endometrial cancer. Pt stated that she was doing well no problems.

## 2020-06-21 NOTE — Addendum Note (Signed)
Addended by: Augusto Gamble on: 06/21/2020 06:02 PM   Modules accepted: Level of Service

## 2020-06-21 NOTE — Progress Notes (Signed)
GYNECOLOGY PROGRESS NOTE  Subjective:    Patient ID: Pamela Norman, female    DOB: 10-21-1941, 79 y.o.   MRN: [id number]  HPI  Patient is a 79 y.o. G0P0000 female who presents for surveillance of endometrial cancer.  She was diagnosed with Stage IA grade 1 endometrioid adenocarcinoma in 2016.  She  Underwent TLH, BSO, pelvic lymph node mapping.  She also completed 6 treatments of vaginal brachytherapy since final path showed LVSI (completed) 09/20/2015). She has been followed up by the GYN Oncology at [facility] for the past 5 years. Alternating with GYN.  Last GYN visit was in 2017 with Dr. Malachi Paradise. She denies any complaints today. Last visit to Oncology was in January 2021.   GYN History:  Last mammogram was 08/19/2018 reported as bi-rads category 2. Repeat 08/2019.  Last bone density was 08/19/2018 consistent with osteopenia. Arranged through PCP. Due 08/2020.  Last colonoscopy in 07/16/2013 with Dr. Gustavo Lah. Recommended repeating in 2019.    Past Medical History:  Diagnosis Date  . Cataract   . Endometrial cancer ([place]) 2016   Grade 1,total laparoscopic hysterectomy  . History of colonoscopy 2014   within normal limits  . History of ovarian cyst 01/2015  . Hyperlipidemia   . Rectal prolapse   . Squamous cell carcinoma    face    Family History  Problem Relation Age of Onset  . Colon cancer Father        older age onset  . COPD Father   . COPD Mother   . Breast cancer Neg Hx   . Ovarian cancer Neg Hx   . Diabetes Neg Hx   . Heart disease Neg Hx     Past Surgical History:  Procedure Laterality Date  . ABDOMINAL HYSTERECTOMY    . APPENDECTOMY    . BILATERAL SALPINGOOPHORECTOMY  03/30/15  . CATARACT EXTRACTION W/PHACO Right 02/06/2016   Procedure: CATARACT EXTRACTION PHACO AND INTRAOCULAR LENS PLACEMENT (IOC);  Surgeon: Estill Cotta, MD;  Location: [facility] ORS;  Service: Ophthalmology;  Laterality: Right;  [place] 01:39 AP% 24.9 CDE 47.32 fluid pack lot  # [id number]  . CATARACT EXTRACTION W/PHACO Left 03/05/2016   Procedure: CATARACT EXTRACTION PHACO AND INTRAOCULAR LENS PLACEMENT ([place]);  Surgeon: Estill Cotta, MD;  Location: [facility] ORS;  Service: Ophthalmology;  Laterality: Left;  [place] 01:02 AP% 23.2 CDE 26.73 fluid pack lot # [id number]  . COMBINED HYSTEROSCOPY DIAGNOSTIC / D&C  2016  . DILATION AND CURETTAGE OF UTERUS    . dnc    . LAPAROSCOPIC HYSTERECTOMY  03/30/15  . NASAL SINUS SURGERY    . SKIN CANCER EXCISION     UPPER LIP  . TONSILECTOMY, ADENOIDECTOMY, BILATERAL MYRINGOTOMY AND TUBES    . TONSILLECTOMY      Social History   Socioeconomic History  . Marital status: Single    Spouse name: Not on file  . Number of children: 0  . Years of education: PhD  . Highest education level: Not on file  Occupational History  . Occupation: Retired  Tobacco Use  . Smoking status: Former Smoker    Packs/day: 0.25    Years: 20.00    Pack years: 5.00    Quit date: 04/13/1981    Years since quitting: 39.2  . Smokeless tobacco: Never Used  Vaping Use  . Vaping Use: Never used  Substance and Sexual Activity  . Alcohol use: No  . Drug use: No  . Sexual activity: Not Currently  Other Topics  Concern  . Not on file  Social History Narrative   Lives in [place]. No children.      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      1-2 cups caffeine daily.   Social Determinants of Health   Financial Resource Strain:   . Difficulty of Paying Living Expenses:   Food Insecurity: No Food Insecurity  . Worried About Charity fundraiser in the Last Year: Never true  . Ran Out of Food in the Last Year: Never true  Transportation Needs:   . Lack of Transportation (Medical):   Marland Kitchen Lack of Transportation (Non-Medical):   Physical Activity:   . Days of Exercise per Week:   . Minutes of Exercise per Session:   Stress:   . Feeling of Stress :     Social Connections:   . Frequency of Communication with Friends and Family:   . Frequency of Social Gatherings with Friends and Family:   . Attends Religious Services:   . Active Member of Clubs or Organizations:   . Attends Archivist Meetings:   Marland Kitchen Marital Status:   Intimate Partner Violence:   . Fear of Current or Ex-Partner:   . Emotionally Abused:   Marland Kitchen Physically Abused:   . Sexually Abused:     Current Outpatient Medications on File Prior to Visit  Medication Sig Dispense Refill  . atorvastatin (LIPITOR) 40 MG tablet TAKE 1 TABLET(40 MG) BY MOUTH DAILY 90 tablet 1  . cholecalciferol (VITAMIN D) 1000 units tablet Take 1,000 Units by mouth daily.    . DULoxetine (CYMBALTA) 30 MG capsule Take 1 capsule (30 mg total) by mouth daily. 30 capsule 2  . metFORMIN (GLUCOPHAGE) 500 MG tablet TAKE 1 TABLET(500 MG) BY MOUTH DAILY WITH BREAKFAST 90 tablet 0  . Multiple Vitamin (MULTIVITAMIN) tablet Take 1 tablet by mouth daily.    . risperiDONE (RISPERDAL) 1 MG tablet at bedtime. As needed     No current facility-administered medications on file prior to visit.    No Known Allergies   Review of Systems Pertinent items noted in HPI and remainder of comprehensive ROS otherwise negative.   Objective:   Blood pressure (!) 148/82, pulse (!) 103, height 5\' 6"  (1.676 m), weight 175 lb (79.4 kg). General appearance: alert and no distress Abdomen: soft, non-tender; bowel sounds normal; no masses,  no organomegaly Pelvic: external genitalia normal, rectovaginal septum normal.  Vagina without discharge. Moderate atrophy present. Uterus and cervix surgically absent. Adnexae surgically absent.  Extremities: extremities normal, atraumatic, no cyanosis or edema Neurologic: Grossly normal  Lymph nodes: Cervical, supraclavicular, and axillary nodes normal.   Assessment:   H/o endometrial cancer Colon cancer screening Breast cancer screening  Plan:   - H/o endometrial cancer - doing  well, no complaints. NED. Continue f/u with Oncology, last visit will be in January, then can resume general GYN care yearly.  - Colon cancer screening, colonoscopy ordered.  - Breat cancer screening, performed every other year due to insurance. - Patient is due this year. Encouraged to schedule.    RTC in 1 year.    Rubie Maid, MD [facility]

## 2020-07-13 DIAGNOSIS — F060 Psychotic disorder with hallucinations due to known physiological condition: Secondary | ICD-10-CM | POA: Diagnosis not present

## 2020-07-17 ENCOUNTER — Other Ambulatory Visit: Payer: Self-pay | Admitting: Family Medicine

## 2020-08-10 DIAGNOSIS — F060 Psychotic disorder with hallucinations due to known physiological condition: Secondary | ICD-10-CM | POA: Diagnosis not present

## 2020-08-24 ENCOUNTER — Telehealth (INDEPENDENT_AMBULATORY_CARE_PROVIDER_SITE_OTHER): Payer: Medicare Other | Admitting: Family Medicine

## 2020-08-24 DIAGNOSIS — E785 Hyperlipidemia, unspecified: Secondary | ICD-10-CM | POA: Diagnosis not present

## 2020-08-24 DIAGNOSIS — E119 Type 2 diabetes mellitus without complications: Secondary | ICD-10-CM | POA: Diagnosis not present

## 2020-08-24 DIAGNOSIS — Z8542 Personal history of malignant neoplasm of other parts of uterus: Secondary | ICD-10-CM

## 2020-08-24 DIAGNOSIS — Z8659 Personal history of other mental and behavioral disorders: Secondary | ICD-10-CM

## 2020-08-24 DIAGNOSIS — R030 Elevated blood-pressure reading, without diagnosis of hypertension: Secondary | ICD-10-CM

## 2020-08-24 NOTE — Progress Notes (Signed)
Virtual Visit via telephone Note  This visit type was conducted due to national recommendations for restrictions regarding the COVID-19 pandemic (e.g. social distancing).  This format is felt to be most appropriate for this patient at this time.  All issues noted in this document were discussed and addressed.  No physical exam was performed (except for noted visual exam findings with Video Visits).   I connected with Pamela Norman today at 10:30 AM EDT by telephone and verified that I am speaking with the correct person using two identifiers. Location patient: home Location provider: work  Persons participating in the virtual visit: patient, provider  I discussed the limitations, risks, security and privacy concerns of performing an evaluation and management service by telephone and the availability of in person appointments. I also discussed with the patient that there may be a patient responsible charge related to this service. The patient expressed understanding and agreed to proceed.  Interactive audio and video telecommunications were attempted between this provider and patient, however failed, due to patient having technical difficulties OR patient did not have access to video capability.  We continued and completed visit with audio only.   Reason for visit: f/u.  HPI: DIABETES Disease Monitoring: Blood Sugar ranges-not checking Polyuria/phagia/dipsia- no      Optho- due Medications: Compliance- taking metformin Hypoglycemic symptoms- no  HYPERLIPIDEMIA Symptoms Chest pain on exertion:  no   Leg claudication:   no Medications: Compliance- taking lipitor Right upper quadrant pain- no  Muscle aches- no  History of psychosis: Patient recently followed up with psychiatry.  She had come off of her Cymbalta and risperidone though felt as though she did better on this.  She notes no anxiety, depression, or auditory or visual hallucinations.  History of endometrial cancer: Recently had  pelvic exam with GYN.  She follows up with the cancer center again in January and then will likely be released for yearly follow-up with GYN.  ROS: See pertinent positives and negatives per HPI.  Past Medical History:  Diagnosis Date  . Cataract   . Endometrial cancer ([place]) 2016   Grade 1,total laparoscopic hysterectomy  . History of colonoscopy 2014   within normal limits  . History of ovarian cyst 01/2015  . Hyperlipidemia   . Rectal prolapse   . Squamous cell carcinoma    face    Past Surgical History:  Procedure Laterality Date  . ABDOMINAL HYSTERECTOMY    . APPENDECTOMY    . BILATERAL SALPINGOOPHORECTOMY  03/30/15  . CATARACT EXTRACTION W/PHACO Right 02/06/2016   Procedure: CATARACT EXTRACTION PHACO AND INTRAOCULAR LENS PLACEMENT (IOC);  Surgeon: Estill Cotta, MD;  Location: [facility] ORS;  Service: Ophthalmology;  Laterality: Right;  [place] 01:39 AP% 24.9 CDE 47.32 fluid pack lot # [id number]  . CATARACT EXTRACTION W/PHACO Left 03/05/2016   Procedure: CATARACT EXTRACTION PHACO AND INTRAOCULAR LENS PLACEMENT ([place]);  Surgeon: Estill Cotta, MD;  Location: [facility] OR[facility];  Service: Ophthalmology;  Laterality: Left;  [place] 01:02 AP% 23.2 CDE 26.73 fluid pack lot # [id number]  . COMBINED HYSTEROSCOPY DIAGNOSTIC / D&C  2016  . DILATION AND CURETTAGE OF UTERUS    . dnc    . LAPAROSCOPIC HYSTERECTOMY  03/30/15  . NASAL SINUS SURGERY    . SKIN CANCER EXCISION     UPPER LIP  . TONSILECTOMY, ADENOIDECTOMY, BILATERAL MYRINGOTOMY AND TUBES    . TONSILLECTOMY      Family History  Problem Relation Age of Onset  . Colon cancer Father  older age onset  . COPD Father   . COPD Mother   . Breast cancer Neg Hx   . Ovarian cancer Neg Hx   . Diabetes Neg Hx   . Heart disease Neg Hx     SOCIAL HX: Former smoker   Current Outpatient Medications:  .  atorvastatin (LIPITOR) 40 MG tablet, TAKE 1 TABLET(40 MG) BY MOUTH DAILY, Disp: 90 tablet, Rfl: 1 .  cholecalciferol (VITAMIN D) 1000 units  tablet, Take 1,000 Units by mouth daily., Disp: , Rfl:  .  DULoxetine (CYMBALTA) 30 MG capsule, Take 1 capsule (30 mg total) by mouth daily., Disp: 30 capsule, Rfl: 2 .  metFORMIN (GLUCOPHAGE) 500 MG tablet, TAKE 1 TABLET(500 MG) BY MOUTH DAILY WITH BREAKFAST, Disp: 90 tablet, Rfl: 0 .  Multiple Vitamin (MULTIVITAMIN) tablet, Take 1 tablet by mouth daily., Disp: , Rfl:  .  risperiDONE (RISPERDAL) 1 MG tablet, at bedtime. As needed, Disp: , Rfl:   EXAM: This was a telephone visit and thus no physical exam was completed.  ASSESSMENT AND PLAN:  Discussed the following assessment and plan:  Diabetes mellitus without complication ([place]) Plan to check labs in about a month.  Continue Metformin 500 mg once daily.  Elevated blood pressure reading Elevated at GYN though had been well controlled previously.  We will plan on rechecking at follow-up.  History of endometrial cancer She will keep her appointment with the cancer center as well as GYN.  History of psychosis Asymptomatic.  Medications managed by psychiatry.  Hyperlipidemia Check LDL with labs.  Continue Lipitor 40 mg once daily.   Health maintenance: She has a visit with GI to discuss possible colonoscopy.  She reports having had the Covid vaccine earlier this year.  She notes she is going to get the flu vaccine at the pharmacy in October.  Orders Placed This Encounter  Procedures  . Comp Met (CMET)    Standing Status:   Future    Standing Expiration Date:   08/24/2021  . LDL cholesterol, direct    Standing Status:   Future    Standing Expiration Date:   08/24/2021  . HgB A1c    Standing Status:   Future    Standing Expiration Date:   08/24/2021  . Urine Microalbumin w/creat. ratio    Standing Status:   Future    Standing Expiration Date:   08/24/2021    No orders of the defined types were placed in this encounter.    I discussed the assessment and treatment plan with the patient. The patient was provided an opportunity  to ask questions and all were answered. The patient agreed with the plan and demonstrated an understanding of the instructions.   The patient was advised to call back or seek an in-person evaluation if the symptoms worsen or if the condition fails to improve as anticipated.  I provided 11 minutes of non-face-to-face time during this encounter.   Tommi Rumps, MD

## 2020-08-24 NOTE — Assessment & Plan Note (Signed)
Plan to check labs in about a month.  Continue Metformin 500 mg once daily.

## 2020-08-24 NOTE — Assessment & Plan Note (Signed)
Elevated at GYN though had been well controlled previously.  We will plan on rechecking at follow-up.

## 2020-08-24 NOTE — Assessment & Plan Note (Signed)
She will keep her appointment with the cancer center as well as GYN.

## 2020-08-24 NOTE — Assessment & Plan Note (Signed)
Asymptomatic.  Medications managed by psychiatry.

## 2020-08-24 NOTE — Assessment & Plan Note (Signed)
Check LDL with labs.  Continue Lipitor 40 mg once daily.

## 2020-08-26 DIAGNOSIS — Z01812 Encounter for preprocedural laboratory examination: Secondary | ICD-10-CM | POA: Diagnosis not present

## 2020-08-26 DIAGNOSIS — K5730 Diverticulosis of large intestine without perforation or abscess without bleeding: Secondary | ICD-10-CM | POA: Diagnosis not present

## 2020-08-26 DIAGNOSIS — Z800 Family history of malignant neoplasm of digestive organs: Secondary | ICD-10-CM | POA: Diagnosis not present

## 2020-08-26 DIAGNOSIS — Z8542 Personal history of malignant neoplasm of other parts of uterus: Secondary | ICD-10-CM | POA: Diagnosis not present

## 2020-09-07 DIAGNOSIS — Z23 Encounter for immunization: Secondary | ICD-10-CM | POA: Diagnosis not present

## 2020-09-23 ENCOUNTER — Other Ambulatory Visit (INDEPENDENT_AMBULATORY_CARE_PROVIDER_SITE_OTHER): Payer: Medicare Other

## 2020-09-23 ENCOUNTER — Other Ambulatory Visit: Payer: Self-pay

## 2020-09-23 DIAGNOSIS — E119 Type 2 diabetes mellitus without complications: Secondary | ICD-10-CM

## 2020-09-23 DIAGNOSIS — Z23 Encounter for immunization: Secondary | ICD-10-CM | POA: Diagnosis not present

## 2020-09-23 DIAGNOSIS — E785 Hyperlipidemia, unspecified: Secondary | ICD-10-CM | POA: Diagnosis not present

## 2020-09-23 LAB — MICROALBUMIN / CREATININE URINE RATIO
Creatinine,U: 135.2 mg/dL
Microalb Creat Ratio: 1.5 mg/g (ref 0.0–30.0)
Microalb, Ur: 2 mg/dL — ABNORMAL HIGH (ref 0.0–1.9)

## 2020-09-23 LAB — COMPREHENSIVE METABOLIC PANEL
ALT: 16 U/L (ref 0–35)
AST: 14 U/L (ref 0–37)
Albumin: 4.2 g/dL (ref 3.5–5.2)
Alkaline Phosphatase: 77 U/L (ref 39–117)
BUN: 24 mg/dL — ABNORMAL HIGH (ref 6–23)
CO2: 30 mEq/L (ref 19–32)
Calcium: 9.3 mg/dL (ref 8.4–10.5)
Chloride: 103 mEq/L (ref 96–112)
Creatinine, Ser: 0.85 mg/dL (ref 0.40–1.20)
GFR: 65.33 mL/min (ref >60.00)
Glucose, Bld: 116 mg/dL — ABNORMAL HIGH (ref 70–99)
Potassium: 4 mEq/L (ref 3.5–5.1)
Sodium: 141 mEq/L (ref 135–145)
Total Bilirubin: 0.6 mg/dL (ref 0.2–1.2)
Total Protein: 6.8 g/dL (ref 6.0–8.3)

## 2020-09-23 LAB — LDL CHOLESTEROL, DIRECT: Direct LDL: 82 mg/dL

## 2020-09-23 LAB — HEMOGLOBIN A1C: Hgb A1c MFr Bld: 6.5 % (ref 4.6–6.5)

## 2020-10-13 DIAGNOSIS — Z20822 Contact with and (suspected) exposure to covid-19: Secondary | ICD-10-CM | POA: Diagnosis not present

## 2020-10-13 DIAGNOSIS — Z01812 Encounter for preprocedural laboratory examination: Secondary | ICD-10-CM | POA: Diagnosis not present

## 2020-10-15 ENCOUNTER — Other Ambulatory Visit: Payer: Self-pay | Admitting: Family Medicine

## 2020-10-18 DIAGNOSIS — K642 Third degree hemorrhoids: Secondary | ICD-10-CM | POA: Diagnosis not present

## 2020-10-18 DIAGNOSIS — Z1211 Encounter for screening for malignant neoplasm of colon: Secondary | ICD-10-CM | POA: Diagnosis not present

## 2020-10-18 DIAGNOSIS — Z800 Family history of malignant neoplasm of digestive organs: Secondary | ICD-10-CM | POA: Diagnosis not present

## 2020-10-18 DIAGNOSIS — K641 Second degree hemorrhoids: Secondary | ICD-10-CM | POA: Diagnosis not present

## 2020-10-18 DIAGNOSIS — K5730 Diverticulosis of large intestine without perforation or abscess without bleeding: Secondary | ICD-10-CM | POA: Diagnosis not present

## 2020-11-09 ENCOUNTER — Ambulatory Visit (INDEPENDENT_AMBULATORY_CARE_PROVIDER_SITE_OTHER): Payer: Medicare Other

## 2020-11-09 VITALS — Ht 66.0 in | Wt 175.0 lb

## 2020-11-09 DIAGNOSIS — Z0000 Encounter for general adult medical examination without abnormal findings: Secondary | ICD-10-CM | POA: Diagnosis not present

## 2020-11-09 DIAGNOSIS — F060 Psychotic disorder with hallucinations due to known physiological condition: Secondary | ICD-10-CM | POA: Diagnosis not present

## 2020-11-09 NOTE — Patient Instructions (Addendum)
[name] , Thank you for taking time to come for your Medicare Wellness Visit. I appreciate your ongoing commitment to your health goals. Please review the following plan we discussed and let me know if I can assist you in the future.   These are the goals we discussed: Goals     Increase physical activity     Use recumbent bike     Reduce Sugar Intake     Low carb diet Low cholesterol diet       This is a list of the screening recommended for you and due dates:  Health Maintenance  Topic Date Due   Eye exam for diabetics  Never done   Complete foot exam   12/12/2018   Tetanus Vaccine  11/09/2021*    Hepatitis C: One time screening is recommended by Center for Disease Control  (CDC) for  adults born from [birth year] through 1964.   11/09/2021*   Hemoglobin A1C  03/24/2021   Urine Protein Check  09/23/2021   Flu Shot  Completed   COVID-19 Vaccine  Completed   Pneumonia vaccines  Completed   DEXA scan (bone density measurement)  Discontinued  *Topic was postponed. The date shown is not the original due date.    Immunizations Immunization History  Administered Date(s) Administered   Fluad Quad(high Dose 65+) 09/23/2020   Influenza, High Dose Seasonal PF 09/04/2018   Influenza,inj,Quad PF,6+ Mos 10/05/2019   Influenza-Unspecified 01/24/2016, 08/16/2016, 08/10/2018   PFIZER SARS-COV-2 Vaccination 01/06/2020, 01/27/2020, 08/30/2020   Pneumococcal Conjugate-13 05/21/2014   Pneumococcal Polysaccharide-23 08/09/2016   Zoster Recombinat (Shingrix) 03/22/2017   Keep all routine maintenance appointments.   Follow up 02/22/21 @ 1:45.  Advanced directives:  Not yet completed.   Conditions/risks identified: none new.   Follow up in one year for your annual wellness visit.   Preventive Care 75 Years and Older, Female Preventive care refers to lifestyle choices and visits with your health care provider that can promote health and wellness. What does preventive  care include?  A yearly physical exam. This is also called an annual well check.  Dental exams once or twice a year.  Routine eye exams. Ask your health care provider how often you should have your eyes checked.  Personal lifestyle choices, including:  Daily care of your teeth and gums.  Regular physical activity.  Eating a healthy diet.  Avoiding tobacco and drug use.  Limiting alcohol use.  Practicing safe sex.  Taking low-dose aspirin every day.  Taking vitamin and mineral supplements as recommended by your health care provider. What happens during an annual well check? The services and screenings done by your health care provider during your annual well check will depend on your age, overall health, lifestyle risk factors, and family history of disease. Counseling  Your health care provider may ask you questions about your:  Alcohol use.  Tobacco use.  Drug use.  Emotional well-being.  Home and relationship well-being.  Sexual activity.  Eating habits.  History of falls.  Memory and ability to understand (cognition).  Work and work Statistician.  Reproductive health. Screening  You may have the following tests or measurements:  Height, weight, and BMI.  Blood pressure.  Lipid and cholesterol levels. These may be checked every 5 years, or more frequently if you are over 38 years old.  Skin check.  Lung cancer screening. You may have this screening every year starting at age 84 if you have a 30-pack-year history of smoking and currently smoke  or have quit within the past 15 years.  Fecal occult blood test (FOBT) of the stool. You may have this test every year starting at age 80.  Flexible sigmoidoscopy or colonoscopy. You may have a sigmoidoscopy every 5 years or a colonoscopy every 10 years starting at age 38.  Hepatitis C blood test.  Hepatitis B blood test.  Sexually transmitted disease (STD) testing.  Diabetes screening. This is done by  checking your blood sugar (glucose) after you have not eaten for a while (fasting). You may have this done every 1-3 years.  Bone density scan. This is done to screen for osteoporosis. You may have this done starting at age 35.  Mammogram. This may be done every 1-2 years. Talk to your health care provider about how often you should have regular mammograms. Talk with your health care provider about your test results, treatment options, and if necessary, the need for more tests. Vaccines  Your health care provider may recommend certain vaccines, such as:  Influenza vaccine. This is recommended every year.  Tetanus, diphtheria, and acellular pertussis (Tdap, Td) vaccine. You may need a Td booster every 10 years.  Zoster vaccine. You may need this after age 90.  Pneumococcal 13-valent conjugate (PCV13) vaccine. One dose is recommended after age 66.  Pneumococcal polysaccharide (PPSV23) vaccine. One dose is recommended after age 38. Talk to your health care provider about which screenings and vaccines you need and how often you need them. This information is not intended to replace advice given to you by your health care provider. Make sure you discuss any questions you have with your health care provider. Document Released: 12/23/2015 Document Revised: 08/15/2016 Document Reviewed: 09/27/2015 Elsevier Interactive Patient Education  2017 [place] Prevention in the Home Falls can cause injuries. They can happen to people of all ages. There are many things you can do to make your home safe and to help prevent falls. What can I do on the outside of my home?  Regularly fix the edges of walkways and driveways and fix any cracks.  Remove anything that might make you trip as you walk through a door, such as a raised step or threshold.  Trim any bushes or trees on the path to your home.  Use bright outdoor lighting.  Clear any walking paths of anything that might make someone trip,  such as rocks or tools.  Regularly check to see if handrails are loose or broken. Make sure that both sides of any steps have handrails.  Any raised decks and porches should have guardrails on the edges.  Have any leaves, snow, or ice cleared regularly.  Use sand or salt on walking paths during winter.  Clean up any spills in your garage right away. This includes oil or grease spills. What can I do in the bathroom?  Use night lights.  Install grab bars by the toilet and in the tub and shower. Do not use towel bars as grab bars.  Use non-skid mats or decals in the tub or shower.  If you need to sit down in the shower, use a plastic, non-slip stool.  Keep the floor dry. Clean up any water that spills on the floor as soon as it happens.  Remove soap buildup in the tub or shower regularly.  Attach bath mats securely with double-sided non-slip rug tape.  Do not have throw rugs and other things on the floor that can make you trip. What can I do in the  bedroom?  Use night lights.  Make sure that you have a light by your bed that is easy to reach.  Do not use any sheets or blankets that are too big for your bed. They should not hang down onto the floor.  Have a firm chair that has side arms. You can use this for support while you get dressed.  Do not have throw rugs and other things on the floor that can make you trip. What can I do in the kitchen?  Clean up any spills right away.  Avoid walking on wet floors.  Keep items that you use a lot in easy-to-reach places.  If you need to reach something above you, use a strong step stool that has a grab bar.  Keep electrical cords out of the way.  Do not use floor polish or wax that makes floors slippery. If you must use wax, use non-skid floor wax.  Do not have throw rugs and other things on the floor that can make you trip. What can I do with my stairs?  Do not leave any items on the stairs.  Make sure that there are  handrails on both sides of the stairs and use them. Fix handrails that are broken or loose. Make sure that handrails are as long as the stairways.  Check any carpeting to make sure that it is firmly attached to the stairs. Fix any carpet that is loose or worn.  Avoid having throw rugs at the top or bottom of the stairs. If you do have throw rugs, attach them to the floor with carpet tape.  Make sure that you have a light switch at the top of the stairs and the bottom of the stairs. If you do not have them, ask someone to add them for you. What else can I do to help prevent falls?  Wear shoes that:  Do not have high heels.  Have rubber bottoms.  Are comfortable and fit you well.  Are closed at the toe. Do not wear sandals.  If you use a stepladder:  Make sure that it is fully opened. Do not climb a closed stepladder.  Make sure that both sides of the stepladder are locked into place.  Ask someone to hold it for you, if possible.  Clearly mark and make sure that you can see:  Any grab bars or handrails.  First and last steps.  Where the edge of each step is.  Use tools that help you move around (mobility aids) if they are needed. These include:  Canes.  Walkers.  Scooters.  Crutches.  Turn on the lights when you go into a dark area. Replace any light bulbs as soon as they burn out.  Set up your furniture so you have a clear path. Avoid moving your furniture around.  If any of your floors are uneven, fix them.  If there are any pets around you, be aware of where they are.  Review your medicines with your doctor. Some medicines can make you feel dizzy. This can increase your chance of falling. Ask your doctor what other things that you can do to help prevent falls. This information is not intended to replace advice given to you by your health care provider. Make sure you discuss any questions you have with your health care provider. Document Released: 09/22/2009  Document Revised: 05/03/2016 Document Reviewed: 12/31/2014 Elsevier Interactive Patient Education  2017 [employer].

## 2020-11-09 NOTE — Progress Notes (Addendum)
Subjective:   Pamela Norman is a 79 y.o. female who presents for Medicare Annual (Subsequent) preventive examination.  Review of Systems    No ROS.  Medicare Wellness Virtual Visit.   Cardiac Risk Factors include: advanced age (>51men, >64 women);diabetes mellitus     Objective:    Today's Vitals   11/09/20 1235  Weight: 175 lb (79.4 kg)  Height: 5\' 6"  (1.676 m)   Body mass index is 28.25 kg/m.  Advanced Directives 11/09/2020 12/16/2019 11/09/2019 03/24/2019 03/22/2019 09/17/2018 08/18/2018  Does Patient Have a Medical Advance Directive? No No No No No No No  Does patient want to make changes to medical advance directive? - - - - - - No - Patient declined  Would patient like information on creating a medical advance directive? - - No - Patient declined No - Patient declined No - Patient declined No - Patient declined -    Current Medications (verified) Outpatient Encounter Medications as of 11/09/2020  Medication Sig  . atorvastatin (LIPITOR) 40 MG tablet TAKE 1 TABLET(40 MG) BY MOUTH DAILY  . cholecalciferol (VITAMIN D) 1000 units tablet Take 1,000 Units by mouth daily.  . DULoxetine (CYMBALTA) 30 MG capsule Take 1 capsule (30 mg total) by mouth daily.  . metFORMIN (GLUCOPHAGE) 500 MG tablet TAKE 1 TABLET(500 MG) BY MOUTH DAILY WITH BREAKFAST  . Multiple Vitamin (MULTIVITAMIN) tablet Take 1 tablet by mouth daily.  . risperiDONE (RISPERDAL) 1 MG tablet at bedtime. As needed   No facility-administered encounter medications on file as of 11/09/2020.    Allergies (verified) Patient has no known allergies.   History: Past Medical History:  Diagnosis Date  . Cataract   . Endometrial cancer ([place]) 2016   Grade 1,total laparoscopic hysterectomy  . History of colonoscopy 2014   within normal limits  . History of ovarian cyst 01/2015  . Hyperlipidemia   . Rectal prolapse   . Squamous cell carcinoma    face   Past Surgical History:  Procedure Laterality Date  . ABDOMINAL  HYSTERECTOMY    . APPENDECTOMY    . BILATERAL SALPINGOOPHORECTOMY  03/30/15  . CATARACT EXTRACTION W/PHACO Right 02/06/2016   Procedure: CATARACT EXTRACTION PHACO AND INTRAOCULAR LENS PLACEMENT (IOC);  Surgeon: Estill Cotta, MD;  Location: [facility] ORS;  Service: Ophthalmology;  Laterality: Right;  [place] 01:39 AP% 24.9 CDE 47.32 fluid pack lot # [id number]  . CATARACT EXTRACTION W/PHACO Left 03/05/2016   Procedure: CATARACT EXTRACTION PHACO AND INTRAOCULAR LENS PLACEMENT ([place]);  Surgeon: Estill Cotta, MD;  Location: [facility] ORS;  Service: Ophthalmology;  Laterality: Left;  [place] 01:02 AP% 23.2 CDE 26.73 fluid pack lot # [id number]  . COMBINED HYSTEROSCOPY DIAGNOSTIC / D&C  2016  . DILATION AND CURETTAGE OF UTERUS    . dnc    . LAPAROSCOPIC HYSTERECTOMY  03/30/15  . NASAL SINUS SURGERY    . SKIN CANCER EXCISION     UPPER LIP  . TONSILECTOMY, ADENOIDECTOMY, BILATERAL MYRINGOTOMY AND TUBES    . TONSILLECTOMY     Family History  Problem Relation Age of Onset  . Colon cancer Father        older age onset  . COPD Father   . COPD Mother   . Breast cancer Neg Hx   . Ovarian cancer Neg Hx   . Diabetes Neg Hx   . Heart disease Neg Hx    Social History   Socioeconomic History  . Marital status: Single    Spouse name: Not on file  .  Number of children: 0  . Years of education: PhD  . Highest education level: Not on file  Occupational History  . Occupation: Retired  Tobacco Use  . Smoking status: Former Smoker    Packs/day: 0.25    Years: 20.00    Pack years: 5.00    Quit date: 04/13/1981    Years since quitting: 39.6  . Smokeless tobacco: Never Used  Vaping Use  . Vaping Use: Never used  Substance and Sexual Activity  . Alcohol use: No  . Drug use: No  . Sexual activity: Not Currently  Other Topics Concern  . Not on file  Social History Narrative   Lives in [place]. No children.      Retired [employer]      Diet - regular diet, herbalife      Exercise - none regular,  [employer]      Right-handed.      1-2 cups caffeine daily.   Social Determinants of Health   Financial Resource Strain: Low Risk   . Difficulty of Paying Living Expenses: Not hard at all  Food Insecurity: No Food Insecurity  . Worried About Charity fundraiser in the Last Year: Never true  . Ran Out of Food in the Last Year: Never true  Transportation Needs: No Transportation Needs  . Lack of Transportation (Medical): No  . Lack of Transportation (Non-Medical): No  Physical Activity:   . Days of Exercise per Week: Not on file  . Minutes of Exercise per Session: Not on file  Stress: No Stress Concern Present  . Feeling of Stress : Not at all  Social Connections: Unknown  . Frequency of Communication with Friends and Family: More than three times a week  . Frequency of Social Gatherings with Friends and Family: More than three times a week  . Attends Religious Services: Not on file  . Active Member of Clubs or Organizations: Not on file  . Attends Archivist Meetings: Not on file  . Marital Status: Not on file    Tobacco Counseling Counseling given: Not Answered   Clinical Intake:  Pre-visit preparation completed: Yes       Nutrition Risk Assessment: Has the patient had any N/V/D within the last 2 months?  No  Does the patient have any non-healing wounds?  No  Has the patient had any unintentional weight loss or weight gain?  No   Diabetes: If diabetic, was a CBG obtained today?  No  Did the patient bring in their glucometer from home?  No . Virtual visit.  How often do you monitor your CBG's? Does not check at home.   Financial Strains and Diabetes Management: Are you having any financial strains with the device, your supplies or your medication? No  Does the patient want to be seen by Chronic Care Management for management of their diabetes?  No  Would the patient like to be referred to a Nutritionist or for Diabetic Management?  No    Diabetic Exams: Eye exam- scheduled in Jan 2022. Foot exam- denies any wounds or changes. Followed by PCP.   Diabetes: Yes (Diet controlled. Followed by PCP.)  How often do you need to have someone help you when you read instructions, pamphlets, or other written materials from your doctor or pharmacy?: 1 - Never  Interpreter Needed?: No    Activities of Daily Living In your present state of health, do you have any difficulty performing the following activities: 11/09/2020  Hearing? N  Vision? N  Difficulty concentrating or making decisions? N  Walking or climbing stairs? N  Dressing or bathing? N  Preparing Food and eating ? N  Comment Staff assist with meal prep; independent living. Self feeds.  Using the Toilet? N  In the past six months, have you accidently leaked urine? N  Do you have problems with loss of bowel control? N  Managing your Medications? N  Managing your Finances? N  Housekeeping or managing your Housekeeping? N  Some recent data might be hidden    Patient Care Team: Leone Haven, MD as PCP - General (Family Medicine)  Indicate any recent Medical Services you may have received from other than [facility] providers in the past year (date may be approximate).     Assessment:   This is a routine wellness examination for G[place]rie.  I connected with Bryah today by telephone and verified that I am speaking with the correct person using two identifiers. Location patient: home Location provider: work Persons participating in the virtual visit: patient, Marine scientist.    I discussed the limitations, risks, security and privacy concerns of performing an evaluation and management service by telephone and the availability of in person appointments. The patient expressed understanding and verbally consented to this telephonic visit.    Interactive audio and video telecommunications were attempted between this provider and patient, however failed, due to patient having  technical difficulties OR patient did not have access to video capability.  We continued and completed visit with audio only.  Some vital signs may be absent or patient reported.   Hearing/Vision screen  Hearing Screening   125Hz  250Hz  500Hz  1000Hz  2000Hz  3000Hz  4000Hz  6000Hz  8000Hz   Right ear:           Left ear:           Comments: Patient is able to hear conversational tones without difficulty.  No issues reported.    Vision Screening Comments: Followed by [facility] (Dr. Jeni Salles)  Wears reading glasses  Cataract extraction, bilateral  Visual acuity not assessed per patient preference since they have regular follow up with the ophthalmologist  Dietary issues and exercise activities discussed: Current Exercise Habits: The patient does not participate in regular exercise at present  Regular diet Good water intake  Goals    . Increase physical activity     Use recumbent bike    . Reduce Sugar Intake     Low carb diet Low cholesterol diet      Depression Screen PHQ 2/9 Scores 11/09/2020 05/13/2020 11/09/2019 08/25/2019 08/18/2018 08/15/2017 08/09/2016  PHQ - 2 Score 0 0 0 0 0 0 0  PHQ- 9 Score - - - 0 - 0 -    Fall Risk Fall Risk  11/09/2020 05/13/2020 11/09/2019 08/25/2019 08/18/2018  Falls in the past year? 0 0 0 0 No  Number falls in past yr: 0 0 - 0 -  Injury with Fall? - - - 0 -  Comment - - - - -  Follow up Falls evaluation completed Falls evaluation completed Education provided;Falls prevention discussed Falls evaluation completed -   Handrails in use when climbing stairs? Yes Home free of loose throw rugs in walkways, pet beds, electrical cords, etc? Yes  Adequate lighting in your home to reduce risk of falls? Yes   ASSISTIVE DEVICES UTILIZED TO PREVENT FALLS: Life alert? Yes   Use of a cane, walker or w/c? No  Grab bars in the bathroom? Yes  Shower  chair or bench in shower? No  Elevated toilet seat or a handicapped toilet? No   TIMED UP AND GO: Was the  test performed? No . Virtual visit.   Cognitive Function: Patient is alert and oriented x3.  Denies difficulty making decisions, memory loss.  MMSE/6CIT deferred. Normal by direct communication/observation.  MMSE - Mini Mental State Exam 08/18/2018 08/15/2017 08/09/2016  Orientation to time 5 5 5   Orientation to Place 5 5 5   Registration 3 3 3   Attention/ Calculation 5 5 5   Recall 3 3 3   Language- name 2 objects 2 2 2   Language- repeat 1 1 1   Language- follow 3 step command 3 3 3   Language- read & follow direction 1 1 1   Write a sentence 1 1 1   Copy design 1 1 1   Total score 30 30 30      6CIT Screen 11/09/2019  What Year? 0 points  What month? 0 points  What time? 0 points  Count back from 20 0 points  Months in reverse 0 points  Repeat phrase 0 points  Total Score 0    Immunizations Immunization History  Administered Date(s) Administered  . Fluad Quad(high Dose 65+) 09/23/2020  . Influenza, High Dose Seasonal PF 09/04/2018  . Influenza,inj,Quad PF,6+ Mos 10/05/2019  . Influenza-Unspecified 01/24/2016, 08/16/2016, 08/10/2018  . PFIZER SARS-COV-2 Vaccination 01/06/2020, 01/27/2020, 08/30/2020  . Pneumococcal Conjugate-13 05/21/2014  . Pneumococcal Polysaccharide-23 08/09/2016  . Zoster Recombinat (Shingrix) 03/22/2017    TDAP status: Due, Education has been provided regarding the importance of this vaccine. Advised may receive this vaccine at local pharmacy or Health Dept. Aware to provide a copy of the vaccination record if obtained from local pharmacy or Health Dept. Verbalized acceptance and understanding.  Deferred.   Health Maintenance Health Maintenance  Topic Date Due  . OPHTHALMOLOGY EXAM  Never done  . FOOT EXAM  12/12/2018  . TETANUS/TDAP  11/09/2021 (Originally 01/30/1960)  . Hepatitis C Screening  11/09/2021 (Originally 18-Dec-1940)  . HEMOGLOBIN A1C  03/24/2021  . URINE MICROALBUMIN  09/23/2021  . INFLUENZA VACCINE  Completed  . COVID-19 Vaccine  Completed    . PNA vac Low Risk Adult  Completed  . DEXA SCAN  Discontinued   Colorectal cancer screening: No longer required.    Mammogram- Completed 08/16/16. Birads- Benign. Order placed 06/21/20 via Rubie Maid, MD. Patient plans to call and schedule.   Dexa Scan- discontinued.   Lung Cancer Screening: (Low Dose CT Chest recommended if Age 32-80 years, 30 pack-year currently smoking OR have quit w/in 15years.) does not qualify.   Hepatitis C Screening: does not qualify.  Vision Screening: Recommended annual ophthalmology exams for early detection of glaucoma and other disorders of the eye. Is the patient up to date with their annual eye exam?  Yes  Who is the provider or what is the name of the office in which the patient attends annual eye exams? [facility].   Dental Screening: Recommended annual dental exams for proper oral hygiene.  Community Resource Referral / Chronic Care Management: CRR required this visit?  No   CCM required this visit?  No      Plan:   Keep all routine maintenance appointments.   Follow up 02/22/21 @ 1:45.  I have personally reviewed and noted the following in the patient's chart:   . Medical and social history . Use of alcohol, tobacco or illicit drugs  . Current medications and supplements . Functional ability and status . Nutritional status .  Physical activity . Advanced directives . List of other physicians . Hospitalizations, surgeries, and ER visits in previous 12 months . Vitals . Screenings to include cognitive, depression, and falls . Referrals and appointments  In addition, I have reviewed and discussed with patient certain preventive protocols, quality metrics, and best practice recommendations. A written personalized care plan for preventive services as well as general preventive health recommendations were provided to patient via mychart.     Varney Biles, LPN   04/0/4591     Agree with plan. Mable Paris,  NP

## 2020-12-11 ENCOUNTER — Other Ambulatory Visit: Payer: Self-pay | Admitting: Family Medicine

## 2020-12-20 LAB — HM DIABETES EYE EXAM

## 2020-12-21 ENCOUNTER — Ambulatory Visit: Payer: Medicare Other

## 2020-12-28 ENCOUNTER — Inpatient Hospital Stay: Payer: Medicare Other

## 2021-01-15 ENCOUNTER — Other Ambulatory Visit: Payer: Self-pay | Admitting: Family Medicine

## 2021-01-18 ENCOUNTER — Inpatient Hospital Stay: Payer: Medicare Other

## 2021-01-18 NOTE — Progress Notes (Deleted)
Gynecologic Oncology Interval Note  Referring Provider: Dr. Malachi Paradise (retired)/Dr. Marcelline Mates  Chief Concern: Endometrial cancer surveillance  Subjective:  Pamela Norman is a 80 y.o. female with history of stage Ia grade 1 endometrial cancer s/p TLH BSO with SLN mapping followed by vaginal brachytherapy for LVSI completed 09/20/2015, who returns to clinic today for continued surveillance.   ***Today, she says that she feels well and denies specific complaints.  No changes in bowel movements, constipation or diarrhea, pain, bleeding. She participated in the virtual senior games and won 3 medals.   Last mammogram was 08/19/2018 reported as bi-rads category 2. Repeat 08/2019.  Last bone density was 08/19/2018 consistent with osteopenia. Arranged through PCP. Due 08/2020.  Last colonoscopy in 07/16/2013 with Dr. Gustavo Lah. Recommended repeating in 2019.   She has established care with Dr. Marcelline Mates and is scheduled to see her in 6 months.    HPI:  Please refer to prior notes for complete details. On 03/30/15 she underwent total laparoscopic hysterectomy, bilateral salpingo-oophorectomy, pelvic sentinel lymph node mapping, and repair of vaginal lacerations caused by removal of the uterus transvaginally.  Her final pathology as noted below.  Sentinel nodes were not identified and node dissection not done since frozen showed grade 1 tumor invading less than 50% (4/9 mm).  Since final path showed LVSI she was referred to Dr. Baruch Gouty for vaginal brachytherapy. She tolerated 6 HDR treatments well and completed on 09/20/2015.   DIAGNOSIS:  A. UTERUS; HYSTERECTOMY:  - ENDOMETRIOID CARCINOMA WITH SQUAMOUS DIFFERENTIATION, GRADE 1, INVADING LESS THAN ONE HALF OF MYOMETRIUM.  - CERVIX NOT INVOLVED.  - INTRAMURAL LEIOMYOMA.   RIGHT AND LEFT FALLOPIAN TUBES AND OVARIES; SALPINGO-OOPHORECTOMY:  - NOT INVOLVED.  - BENIGN SIMPLE CYST OF RIGHT OVARY, 3.2 CM.  TUMOR  Histologic Type:  Endometrioid  adenocarcinoma, variant (specify) with squamous differentiation  Histologic Grade:  FIGO grade 1   EXTENT:  Tumor Size: 3.5cm at Greatest dimension (cm)  Myometrial Invasion: Present  Depth of Myometrial Invasion: Specify depth of invasion (mm) - 60mm  Myometrial Thickness (mm):  48mm  Involvement of Cervix:  Not involved  Other Organs Submitted:  Right ovary - Not involved  Left ovary - Not involved  Right fallopian tube  - Not involved  Left fallopian tube - Not involved   ACCESSORY FINDINGS  Lymph-Vascular Invasion: Present   STAGE (pTNM [FIGO])  Primary Tumor (pT):  pT1a [IA]: Tumor limited to endometrium or invades less than one-half  of the myometrium  Regional Lymph Nodes (pN)  pNX: Cannot be assessed  Pelvic Lymph Nodes: No pelvic nodes submitted or found  Para-aortic Lymph Nodes: No para-aortic nodes submitted or found  Distant Metastasis (pM): Not applicable   Cytology: DIAGNOSIS:  A. PELVIC WASHINGS:  - NEGATIVE FOR MALIGNANCY.    Problem List: Patient Active Problem List   Diagnosis Date Noted  . Overweight 05/13/2019  . Skin lesions 12/12/2017  . Elevated blood pressure reading 12/12/2017  . Diabetes mellitus without complication ([place]) 53/29/9242  . History of psychosis 09/17/2016  . Osteopenia 09/17/2016  . Vitamin D deficiency 09/17/2016  . History of endometrial cancer 05/04/2015  . Hyperlipidemia 05/21/2014    Past Medical History: Past Medical History:  Diagnosis Date  . Cataract   . Endometrial cancer ([place]) 2016   Grade 1,total laparoscopic hysterectomy  . History of colonoscopy 2014   within normal limits  . History of ovarian cyst 01/2015  . Hyperlipidemia   . Rectal prolapse   . Squamous cell  carcinoma    face    Past Surgical History: Past Surgical History:  Procedure Laterality Date  . ABDOMINAL HYSTERECTOMY    . APPENDECTOMY    . BILATERAL SALPINGOOPHORECTOMY  03/30/15  . CATARACT EXTRACTION W/PHACO Right 02/06/2016    Procedure: CATARACT EXTRACTION PHACO AND INTRAOCULAR LENS PLACEMENT (IOC);  Surgeon: Estill Cotta, MD;  Location: [facility] ORS;  Service: Ophthalmology;  Laterality: Right;  [place] 01:39 AP% 24.9 CDE 47.32 fluid pack lot # [id number]  . CATARACT EXTRACTION W/PHACO Left 03/05/2016   Procedure: CATARACT EXTRACTION PHACO AND INTRAOCULAR LENS PLACEMENT ([place]);  Surgeon: Estill Cotta, MD;  Location: [facility] ORS;  Service: Ophthalmology;  Laterality: Left;  [place] 01:02 AP% 23.2 CDE 26.73 fluid pack lot # [id number]  . COMBINED HYSTEROSCOPY DIAGNOSTIC / D&C  2016  . DILATION AND CURETTAGE OF UTERUS    . dnc    . LAPAROSCOPIC HYSTERECTOMY  03/30/15  . NASAL SINUS SURGERY    . SKIN CANCER EXCISION     UPPER LIP  . TONSILECTOMY, ADENOIDECTOMY, BILATERAL MYRINGOTOMY AND TUBES    . TONSILLECTOMY      OB History:  OB History  Gravida Para Term Preterm AB Living  0 0 0 0 0 0  SAB IAB Ectopic Multiple Live Births  0 0 0 0    Obstetric Comments  Age at first menarche-age [age]    Family History: Family History  Problem Relation Age of Onset  . Colon cancer Father        older age onset  . COPD Father   . COPD Mother   . Breast cancer Neg Hx   . Ovarian cancer Neg Hx   . Diabetes Neg Hx   . Heart disease Neg Hx     Social History: Social History   Socioeconomic History  . Marital status: Single    Spouse name: Not on file  . Number of children: 0  . Years of education: PhD  . Highest education level: Not on file  Occupational History  . Occupation: Retired  Tobacco Use  . Smoking status: Former Smoker    Packs/day: 0.25    Years: 20.00    Pack years: 5.00    Quit date: 04/13/1981    Years since quitting: 39.7  . Smokeless tobacco: Never Used  Vaping Use  . Vaping Use: Never used  Substance and Sexual Activity  . Alcohol use: No  . Drug use: No  . Sexual activity: Not Currently  Other Topics Concern  . Not on file  Social History Narrative   Lives in [place]. No children.       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      1-2 cups caffeine daily.   Social Determinants of Health   Financial Resource Strain: Low Risk   . Difficulty of Paying Living Expenses: Not hard at all  Food Insecurity: No Food Insecurity  . Worried About Charity fundraiser in the Last Year: Never true  . Ran Out of Food in the Last Year: Never true  Transportation Needs: No Transportation Needs  . Lack of Transportation (Medical): No  . Lack of Transportation (Non-Medical): No  Physical Activity: Not on file  Stress: No Stress Concern Present  . Feeling of Stress : Not at all  Social Connections: Unknown  . Frequency of Communication with Friends and  Family: More than three times a week  . Frequency of Social Gatherings with Friends and Family: More than three times a week  . Attends Religious Services: Not on file  . Active Member of Clubs or Organizations: Not on file  . Attends Archivist Meetings: Not on file  . Marital Status: Not on file  Intimate Partner Violence: Not At Risk  . Fear of Current or Ex-Partner: No  . Emotionally Abused: No  . Physically Abused: No  . Sexually Abused: No    Allergies: No Known Allergies  Current Medications: Current Outpatient Medications  Medication Sig Dispense Refill  . atorvastatin (LIPITOR) 40 MG tablet TAKE 1 TABLET(40 MG) BY MOUTH DAILY 90 tablet 1  . cholecalciferol (VITAMIN D) 1000 units tablet Take 1,000 Units by mouth daily.    . DULoxetine (CYMBALTA) 30 MG capsule Take 1 capsule (30 mg total) by mouth daily. 30 capsule 2  . metFORMIN (GLUCOPHAGE) 500 MG tablet TAKE 1 TABLET(500 MG) BY MOUTH DAILY WITH BREAKFAST 90 tablet 0  . Multiple Vitamin (MULTIVITAMIN) tablet Take 1 tablet by mouth daily.    . risperiDONE (RISPERDAL) 1 MG tablet at bedtime. As needed     No current facility-administered medications for  this visit.    ***General: no complaints  HEENT: no complaints  Lungs: no complaints  Cardiac: no complaints  GI: no complaints  GU: no complaints  Musculoskeletal: no complaints  Extremities: no complaints  Skin: no complaints  Neuro: no complaints  Endocrine: no complaints  Psych: no complaints        Objective:   There were no vitals filed for this visit.There is no height or weight on file to calculate BMI.  ECOG Performance Status: 0 - Asymptomatic  GENERAL: Patient is a well appearing female in no acute distress HEENT:  PERRL, neck supple with midline trachea. Thyroid without masses.  NODES:  No cervical, supraclavicular, axillary, or inguinal lymphadenopathy palpated.  LUNGS:  Clear to auscultation bilaterally.  No wheezes or rhonchi. HEART:  Regular rate and rhythm. No murmur appreciated. ABDOMEN:  Soft, nontender.  Positive, normoactive bowel sounds.  MSK:  No focal spinal tenderness to palpation. Full range of motion bilaterally in the upper extremities. EXTREMITIES:  No peripheral edema.   SKIN:  Clear with no obvious rashes or skin changes. No nail dyscrasia. NEURO:  Nonfocal. Well oriented.  Appropriate affect.  ***Pelvic: EGBUS: no lesions Cervix: no lesions, nontender, mobile Vagina: no lesions, no discharge or bleeding Uterus: normal size, nontender, mobile Adnexa: no palpable masses Rectovaginal: confirmatory     Assessment:  Pamela Norman is a 80 y.o. female diagnosed with stage IA, grade 1, endometrioid adenocarcinoma of the endometrium S/P TLH/BSO in 4/16 and vaginal brachytherapy due to 4/9 mm invasion and LVSI. NED.   Plan:   Problem List Items Addressed This Visit   None    *** She will see Dr. Marcelline Mates in 6 months and she will see [place] in 12 months. We reviewed alternating visits with Dr. Marcelline Mates and our team. After her next visit she will be released from our clinic.    I discussed the assessment and treatment plan with the patient. The  patient was provided an opportunity to ask questions and all were answered. The patient agreed with the plan and demonstrated an understanding of the instructions.   The patient was advised to call back or seek an in-person evaluation if the symptoms worsen or if the condition fails to improve as anticipated.  I personally saw the patient and performed a substantive portion of this encounter in conjunction with the listed APP as documented above.  Kaiyla Stahly Gaetana Michaelis, MD

## 2021-01-25 ENCOUNTER — Inpatient Hospital Stay: Payer: Medicare Other | Attending: Obstetrics and Gynecology | Admitting: Obstetrics and Gynecology

## 2021-01-25 VITALS — BP 140/102 | HR 88 | Temp 98.7°F | Resp 20 | Wt 186.9 lb

## 2021-01-25 DIAGNOSIS — Z923 Personal history of irradiation: Secondary | ICD-10-CM | POA: Insufficient documentation

## 2021-01-25 DIAGNOSIS — Z79899 Other long term (current) drug therapy: Secondary | ICD-10-CM | POA: Insufficient documentation

## 2021-01-25 DIAGNOSIS — E860 Dehydration: Secondary | ICD-10-CM | POA: Diagnosis not present

## 2021-01-25 DIAGNOSIS — F32A Depression, unspecified: Secondary | ICD-10-CM | POA: Diagnosis not present

## 2021-01-25 DIAGNOSIS — E1136 Type 2 diabetes mellitus with diabetic cataract: Secondary | ICD-10-CM | POA: Insufficient documentation

## 2021-01-25 DIAGNOSIS — E663 Overweight: Secondary | ICD-10-CM | POA: Insufficient documentation

## 2021-01-25 DIAGNOSIS — M8580 Other specified disorders of bone density and structure, unspecified site: Secondary | ICD-10-CM | POA: Insufficient documentation

## 2021-01-25 DIAGNOSIS — R4182 Altered mental status, unspecified: Secondary | ICD-10-CM | POA: Diagnosis not present

## 2021-01-25 DIAGNOSIS — G9340 Encephalopathy, unspecified: Secondary | ICD-10-CM | POA: Diagnosis not present

## 2021-01-25 DIAGNOSIS — E559 Vitamin D deficiency, unspecified: Secondary | ICD-10-CM | POA: Diagnosis not present

## 2021-01-25 DIAGNOSIS — Z8542 Personal history of malignant neoplasm of other parts of uterus: Secondary | ICD-10-CM | POA: Diagnosis not present

## 2021-01-25 DIAGNOSIS — Z87891 Personal history of nicotine dependence: Secondary | ICD-10-CM | POA: Diagnosis not present

## 2021-01-25 DIAGNOSIS — Z85828 Personal history of other malignant neoplasm of skin: Secondary | ICD-10-CM | POA: Diagnosis not present

## 2021-01-25 DIAGNOSIS — N3000 Acute cystitis without hematuria: Secondary | ICD-10-CM | POA: Diagnosis not present

## 2021-01-25 DIAGNOSIS — Z90722 Acquired absence of ovaries, bilateral: Secondary | ICD-10-CM | POA: Insufficient documentation

## 2021-01-25 DIAGNOSIS — E119 Type 2 diabetes mellitus without complications: Secondary | ICD-10-CM | POA: Diagnosis not present

## 2021-01-25 DIAGNOSIS — F22 Delusional disorders: Secondary | ICD-10-CM | POA: Diagnosis not present

## 2021-01-25 DIAGNOSIS — R000 Tachycardia, unspecified: Secondary | ICD-10-CM | POA: Diagnosis not present

## 2021-01-25 DIAGNOSIS — R079 Chest pain, unspecified: Secondary | ICD-10-CM | POA: Diagnosis not present

## 2021-01-25 DIAGNOSIS — E785 Hyperlipidemia, unspecified: Secondary | ICD-10-CM | POA: Diagnosis not present

## 2021-01-25 DIAGNOSIS — Z20822 Contact with and (suspected) exposure to covid-19: Secondary | ICD-10-CM | POA: Diagnosis not present

## 2021-01-25 DIAGNOSIS — Z9079 Acquired absence of other genital organ(s): Secondary | ICD-10-CM | POA: Diagnosis not present

## 2021-01-25 DIAGNOSIS — Z7984 Long term (current) use of oral hypoglycemic drugs: Secondary | ICD-10-CM | POA: Insufficient documentation

## 2021-01-25 DIAGNOSIS — Z90710 Acquired absence of both cervix and uterus: Secondary | ICD-10-CM | POA: Diagnosis not present

## 2021-01-25 NOTE — Progress Notes (Signed)
Gynecologic Oncology Interval Note  Referring Provider: Dr. Malachi Paradise (retired)/Dr. Marcelline Mates  Chief Concern: Endometrial cancer surveillance  Subjective:  Pamela Norman is a 80 y.o. female with history of stage Ia grade 1 endometrial cancer s/p TLH BSO with SLN mapping followed by vaginal brachytherapy for LVSI completed 09/20/2015, who returns to clinic today for continued surveillance.   Today, she says that she feels well and denies specific complaints.  No changes in bowel movements, constipation or diarrhea, pain, bleeding.    She has established care with Dr. Marcelline Mates   HPI:  Please refer to prior notes for complete details. On 03/30/15 she underwent total laparoscopic hysterectomy, bilateral salpingo-oophorectomy, pelvic sentinel lymph node mapping, and repair of vaginal lacerations caused by removal of the uterus transvaginally.  Her final pathology as noted below.  Sentinel nodes were not identified and node dissection not done since frozen showed grade 1 tumor invading less than 50% (4/9 mm).  Since final path showed LVSI she was referred to Dr. Baruch Gouty for vaginal brachytherapy. She tolerated 6 HDR treatments well and completed on 09/20/2015.   DIAGNOSIS:  A. UTERUS; HYSTERECTOMY:  - ENDOMETRIOID CARCINOMA WITH SQUAMOUS DIFFERENTIATION, GRADE 1, INVADING LESS THAN ONE HALF OF MYOMETRIUM.  - CERVIX NOT INVOLVED.  - INTRAMURAL LEIOMYOMA.   RIGHT AND LEFT FALLOPIAN TUBES AND OVARIES; SALPINGO-OOPHORECTOMY:  - NOT INVOLVED.  - BENIGN SIMPLE CYST OF RIGHT OVARY, 3.2 CM.  TUMOR  Histologic Type:  Endometrioid adenocarcinoma, variant (specify) with squamous differentiation  Histologic Grade:  FIGO grade 1   EXTENT:  Tumor Size: 3.5cm at Greatest dimension (cm)  Myometrial Invasion: Present  Depth of Myometrial Invasion: Specify depth of invasion (mm) - 32mm  Myometrial Thickness (mm):  29mm  Involvement of Cervix:  Not involved  Other Organs Submitted:  Right  ovary - Not involved  Left ovary - Not involved  Right fallopian tube  - Not involved  Left fallopian tube - Not involved   ACCESSORY FINDINGS  Lymph-Vascular Invasion: Present   STAGE (pTNM [FIGO])  Primary Tumor (pT):  pT1a [IA]: Tumor limited to endometrium or invades less than one-half  of the myometrium  Regional Lymph Nodes (pN)  pNX: Cannot be assessed  Pelvic Lymph Nodes: No pelvic nodes submitted or found  Para-aortic Lymph Nodes: No para-aortic nodes submitted or found  Distant Metastasis (pM): Not applicable   Cytology: DIAGNOSIS:  A. PELVIC WASHINGS:  - NEGATIVE FOR MALIGNANCY.    Problem List: Patient Active Problem List   Diagnosis Date Noted  . Overweight 05/13/2019  . Skin lesions 12/12/2017  . Elevated blood pressure reading 12/12/2017  . Diabetes mellitus without complication ([place]) 99/83/3825  . History of psychosis 09/17/2016  . Osteopenia 09/17/2016  . Vitamin D deficiency 09/17/2016  . History of endometrial cancer 05/04/2015  . Hyperlipidemia 05/21/2014    Past Medical History: Past Medical History:  Diagnosis Date  . Cataract   . Endometrial cancer ([place]) 2016   Grade 1,total laparoscopic hysterectomy  . History of colonoscopy 2014   within normal limits  . History of ovarian cyst 01/2015  . Hyperlipidemia   . Rectal prolapse   . Squamous cell carcinoma    face    Past Surgical History: Past Surgical History:  Procedure Laterality Date  . ABDOMINAL HYSTERECTOMY    . APPENDECTOMY    . BILATERAL SALPINGOOPHORECTOMY  03/30/15  . CATARACT EXTRACTION W/PHACO Right 02/06/2016   Procedure: CATARACT EXTRACTION PHACO AND INTRAOCULAR LENS PLACEMENT (IOC);  Surgeon: Estill Cotta, MD;  Location: [facility] ORS;  Service: Ophthalmology;  Laterality: Right;  [place] 01:39 AP% 24.9 CDE 47.32 fluid pack lot # [id number]  . CATARACT EXTRACTION W/PHACO Left 03/05/2016   Procedure: CATARACT EXTRACTION PHACO AND INTRAOCULAR LENS PLACEMENT ([place]);  Surgeon: Estill Cotta, MD;  Location: [facility] ORS;  Service: Ophthalmology;  Laterality: Left;  [place] 01:02 AP% 23.2 CDE 26.73 fluid pack lot # [id number]  . COMBINED HYSTEROSCOPY DIAGNOSTIC / D&C  2016  . DILATION AND CURETTAGE OF UTERUS    . dnc    . LAPAROSCOPIC HYSTERECTOMY  03/30/15  . NASAL SINUS SURGERY    . SKIN CANCER EXCISION     UPPER LIP  . TONSILECTOMY, ADENOIDECTOMY, BILATERAL MYRINGOTOMY AND TUBES    . TONSILLECTOMY      OB History:  OB History  Gravida Para Term Preterm AB Living  0 0 0 0 0 0  SAB IAB Ectopic Multiple Live Births  0 0 0 0    Obstetric Comments  Age at first menarche-age [age]    Family History: Family History  Problem Relation Age of Onset  . Colon cancer Father        older age onset  . COPD Father   . COPD Mother   . Breast cancer Neg Hx   . Ovarian cancer Neg Hx   . Diabetes Neg Hx   . Heart disease Neg Hx     Social History: Social History   Socioeconomic History  . Marital status: Single    Spouse name: Not on file  . Number of children: 0  . Years of education: PhD  . Highest education level: Not on file  Occupational History  . Occupation: Retired  Tobacco Use  . Smoking status: Former Smoker    Packs/day: 0.25    Years: 20.00    Pack years: 5.00    Quit date: 04/13/1981    Years since quitting: 39.8  . Smokeless tobacco: Never Used  Vaping Use  . Vaping Use: Never used  Substance and Sexual Activity  . Alcohol use: No  . Drug use: No  . Sexual activity: Not Currently  Other Topics Concern  . Not on file  Social History Narrative   Lives in [place]. No children.      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      1-2 cups caffeine daily.   Social Determinants of Health   Financial Resource Strain: Low Risk   . Difficulty of Paying Living Expenses: Not hard at all  Food Insecurity: No Food Insecurity  . Worried About  Charity fundraiser in the Last Year: Never true  . Ran Out of Food in the Last Year: Never true  Transportation Needs: No Transportation Needs  . Lack of Transportation (Medical): No  . Lack of Transportation (Non-Medical): No  Physical Activity: Not on file  Stress: No Stress Concern Present  . Feeling of Stress : Not at all  Social Connections: Unknown  . Frequency of Communication with Friends and Family: More than three times a week  . Frequency of Social Gatherings with Friends and Family: More than three times a week  . Attends Religious Services: Not on file  . Active Member of Clubs or Organizations: Not on file  . Attends Archivist Meetings: Not on file  . Marital Status: Not on file  Intimate Partner Violence: Not At Risk  . Fear of Current or Ex-Partner: No  . Emotionally Abused: No  . Physically Abused: No  . Sexually Abused: No    Allergies: No Known Allergies  Current Medications: Current Outpatient Medications  Medication Sig Dispense Refill  . atorvastatin (LIPITOR) 40 MG tablet TAKE 1 TABLET(40 MG) BY MOUTH DAILY 90 tablet 1  . cholecalciferol (VITAMIN D) 1000 units tablet Take 1,000 Units by mouth daily.    . DULoxetine (CYMBALTA) 30 MG capsule Take 1 capsule (30 mg total) by mouth daily. 30 capsule 2  . metFORMIN (GLUCOPHAGE) 500 MG tablet TAKE 1 TABLET(500 MG) BY MOUTH DAILY WITH BREAKFAST 90 tablet 0  . Multiple Vitamin (MULTIVITAMIN) tablet Take 1 tablet by mouth daily.    . risperiDONE (RISPERDAL) 1 MG tablet at bedtime. As needed     No current facility-administered medications for this visit.    Review of Systems General:  no complaints Skin: no complaints Eyes: no complaints HEENT: no complaints Breasts: no complaints Pulmonary: no complaints Cardiac: no complaints Gastrointestinal: no complaints Genitourinary/Sexual: no complaints Ob/Gyn: no complaints Musculoskeletal: no complaints Hematology: no complaints Neurologic/Psych:  no complaints   Objective:   There were no vitals filed for this visit.There is no height or weight on file to calculate BMI.  ECOG Performance Status: 0 - Asymptomatic  GENERAL: Patient is a well appearing female in no acute distress HEENT:  Sclera clear. Anicteric NODES:  Negative axillary, supraclavicular, inguinal lymph node survery LUNGS:  Clear to auscultation bilaterally.   HEART:  Regular rate and rhythm.  ABDOMEN:  Soft, nontender.  No hernias, incisions well healed. No masses or ascites EXTREMITIES:  No peripheral edema. Atraumatic. No cyanosis SKIN:  Clear with no obvious rashes or skin changes.  NEURO:  Nonfocal. Well oriented.  Appropriate affect.  Pelvic: EGBUS: normal, Vagina: well healed with some mild radiation changes, no lesions; Cervix/Uterus: surgically absent. Bimanual: no masses or nodularity.  RV: deferred.     Assessment:  SARH KIRSCHENBAUM is a 80 y.o. female diagnosed with stage IA, grade 1, endometrioid adenocarcinoma of the endometrium S/P TLH/BSO in 4/16 and vaginal brachytherapy due to 4/9 mm invasion and LVSI. NED.   Plan:   Problem List Items Addressed This Visit      Other   History of endometrial cancer - Primary     She will release her and she will see Dr. Marcelline Mates in 12 months.  She can return in the future should the need arise.   Mellody Drown, MD

## 2021-01-25 NOTE — Patient Instructions (Signed)
Make appointment to see Dr. Marcelline Mates in 1 year.

## 2021-01-27 ENCOUNTER — Emergency Department: Payer: Medicare Other

## 2021-01-27 ENCOUNTER — Inpatient Hospital Stay
Admission: EM | Admit: 2021-01-27 | Discharge: 2021-01-28 | DRG: 885 | Disposition: A | Discharge status: Nursing Home (Includes ICF) | Payer: Medicare Other | Source: Skilled Nursing Facility | Attending: Family Medicine | Admitting: Family Medicine

## 2021-01-27 ENCOUNTER — Other Ambulatory Visit: Payer: Self-pay

## 2021-01-27 DIAGNOSIS — E860 Dehydration: Secondary | ICD-10-CM | POA: Diagnosis not present

## 2021-01-27 DIAGNOSIS — G9340 Encephalopathy, unspecified: Secondary | ICD-10-CM

## 2021-01-27 DIAGNOSIS — R9431 Abnormal electrocardiogram [ECG] [EKG]: Secondary | ICD-10-CM | POA: Diagnosis present

## 2021-01-27 DIAGNOSIS — Z9079 Acquired absence of other genital organ(s): Secondary | ICD-10-CM

## 2021-01-27 DIAGNOSIS — E119 Type 2 diabetes mellitus without complications: Secondary | ICD-10-CM

## 2021-01-27 DIAGNOSIS — Z79899 Other long term (current) drug therapy: Secondary | ICD-10-CM

## 2021-01-27 DIAGNOSIS — F32A Depression, unspecified: Secondary | ICD-10-CM | POA: Diagnosis present

## 2021-01-27 DIAGNOSIS — Z90710 Acquired absence of both cervix and uterus: Secondary | ICD-10-CM | POA: Diagnosis not present

## 2021-01-27 DIAGNOSIS — N19 Unspecified kidney failure: Secondary | ICD-10-CM | POA: Diagnosis present

## 2021-01-27 DIAGNOSIS — F22 Delusional disorders: Principal | ICD-10-CM

## 2021-01-27 DIAGNOSIS — N3000 Acute cystitis without hematuria: Secondary | ICD-10-CM | POA: Diagnosis present

## 2021-01-27 DIAGNOSIS — Z20822 Contact with and (suspected) exposure to covid-19: Secondary | ICD-10-CM | POA: Diagnosis present

## 2021-01-27 DIAGNOSIS — R5381 Other malaise: Secondary | ICD-10-CM | POA: Diagnosis not present

## 2021-01-27 DIAGNOSIS — Z85828 Personal history of other malignant neoplasm of skin: Secondary | ICD-10-CM

## 2021-01-27 DIAGNOSIS — Z8542 Personal history of malignant neoplasm of other parts of uterus: Secondary | ICD-10-CM

## 2021-01-27 DIAGNOSIS — E559 Vitamin D deficiency, unspecified: Secondary | ICD-10-CM | POA: Diagnosis present

## 2021-01-27 DIAGNOSIS — Z90722 Acquired absence of ovaries, bilateral: Secondary | ICD-10-CM | POA: Diagnosis not present

## 2021-01-27 DIAGNOSIS — I10 Essential (primary) hypertension: Secondary | ICD-10-CM | POA: Diagnosis not present

## 2021-01-27 DIAGNOSIS — R079 Chest pain, unspecified: Secondary | ICD-10-CM | POA: Diagnosis not present

## 2021-01-27 DIAGNOSIS — R410 Disorientation, unspecified: Secondary | ICD-10-CM | POA: Diagnosis not present

## 2021-01-27 DIAGNOSIS — E785 Hyperlipidemia, unspecified: Secondary | ICD-10-CM | POA: Diagnosis present

## 2021-01-27 DIAGNOSIS — Z87891 Personal history of nicotine dependence: Secondary | ICD-10-CM | POA: Diagnosis not present

## 2021-01-27 DIAGNOSIS — R4182 Altered mental status, unspecified: Secondary | ICD-10-CM | POA: Diagnosis not present

## 2021-01-27 DIAGNOSIS — R000 Tachycardia, unspecified: Secondary | ICD-10-CM | POA: Diagnosis not present

## 2021-01-27 DIAGNOSIS — Z7984 Long term (current) use of oral hypoglycemic drugs: Secondary | ICD-10-CM | POA: Diagnosis not present

## 2021-01-27 HISTORY — DX: Spinal stenosis, cervical region: M48.02

## 2021-01-27 LAB — URINE DRUG SCREEN, QUALITATIVE (ARMC ONLY)
Amphetamines, Ur Screen: NOT DETECTED
Barbiturates, Ur Screen: NOT DETECTED
Benzodiazepine, Ur Scrn: NOT DETECTED
Cannabinoid 50 Ng, Ur ~~LOC~~: NOT DETECTED
Cocaine Metabolite,Ur ~~LOC~~: NOT DETECTED
MDMA (Ecstasy)Ur Screen: NOT DETECTED
Methadone Scn, Ur: NOT DETECTED
Opiate, Ur Screen: NOT DETECTED
Phencyclidine (PCP) Ur S: NOT DETECTED
Tricyclic, Ur Screen: NOT DETECTED

## 2021-01-27 LAB — URINALYSIS, COMPLETE (UACMP) WITH MICROSCOPIC
Bilirubin Urine: NEGATIVE
Glucose, UA: NEGATIVE mg/dL
Hgb urine dipstick: NEGATIVE
Ketones, ur: 20 mg/dL — AB
Leukocytes,Ua: NEGATIVE
Nitrite: POSITIVE — AB
Protein, ur: NEGATIVE mg/dL
Specific Gravity, Urine: 1.011 (ref 1.005–1.030)
pH: 6 (ref 5.0–8.0)

## 2021-01-27 LAB — COMPREHENSIVE METABOLIC PANEL
ALT: 19 U/L (ref 0–44)
AST: 21 U/L (ref 15–41)
Albumin: 4.3 g/dL (ref 3.5–5.0)
Alkaline Phosphatase: 75 U/L (ref 38–126)
Anion gap: 10 (ref 5–15)
BUN: 23 mg/dL (ref 8–23)
CO2: 24 mmol/L (ref 22–32)
Calcium: 9 mg/dL (ref 8.9–10.3)
Chloride: 103 mmol/L (ref 98–111)
Creatinine, Ser: 0.78 mg/dL (ref 0.44–1.00)
GFR, Estimated: >60 mL/min (ref >60)
Glucose, Bld: 154 mg/dL — ABNORMAL HIGH (ref 70–99)
Potassium: 4.3 mmol/L (ref 3.5–5.1)
Sodium: 137 mmol/L (ref 135–145)
Total Bilirubin: 1.1 mg/dL (ref 0.3–1.2)
Total Protein: 7.3 g/dL (ref 6.5–8.1)

## 2021-01-27 LAB — CBC WITH DIFFERENTIAL/PLATELET
Abs Immature Granulocytes: 0.03 10*3/uL (ref 0.00–0.07)
Basophils Absolute: 0 10*3/uL (ref 0.0–0.1)
Basophils Relative: 1 %
Eosinophils Absolute: 0.1 10*3/uL (ref 0.0–0.5)
Eosinophils Relative: 1 %
HCT: 41.2 % (ref 36.0–46.0)
Hemoglobin: 13.4 g/dL (ref 12.0–15.0)
Immature Granulocytes: 1 %
Lymphocytes Relative: 22 %
Lymphs Abs: 1.4 10*3/uL (ref 0.7–4.0)
MCH: 30 pg (ref 26.0–34.0)
MCHC: 32.5 g/dL (ref 30.0–36.0)
MCV: 92.2 fL (ref 80.0–100.0)
Monocytes Absolute: 0.5 10*3/uL (ref 0.1–1.0)
Monocytes Relative: 8 %
Neutro Abs: 4.4 10*3/uL (ref 1.7–7.7)
Neutrophils Relative %: 67 %
Platelets: 231 10*3/uL (ref 150–400)
RBC: 4.47 MIL/uL (ref 3.87–5.11)
RDW: 13.6 % (ref 11.5–15.5)
WBC: 6.4 10*3/uL (ref 4.0–10.5)
nRBC: 0 % (ref 0.0–0.2)

## 2021-01-27 LAB — LIPASE, BLOOD: Lipase: 34 U/L (ref 11–51)

## 2021-01-27 LAB — TROPONIN I (HIGH SENSITIVITY)
Troponin I (High Sensitivity): 4 ng/L (ref <18)
Troponin I (High Sensitivity): 4 ng/L (ref <18)

## 2021-01-27 LAB — MAGNESIUM: Magnesium: 2.1 mg/dL (ref 1.7–2.4)

## 2021-01-27 LAB — GLUCOSE, CAPILLARY: Glucose-Capillary: 93 mg/dL (ref 70–99)

## 2021-01-27 LAB — CK: Total CK: 89 U/L (ref 38–234)

## 2021-01-27 MED ORDER — LORAZEPAM 2 MG/ML IJ SOLN
INTRAMUSCULAR | Status: AC
  Administered 2021-01-27: 1 mg via INTRAVENOUS
  Filled 2021-01-27: qty 1

## 2021-01-27 MED ORDER — DULOXETINE HCL 30 MG PO CPEP
30.0000 mg | ORAL_CAPSULE | Freq: Every day | ORAL | Status: DC
  Administered 2021-01-27 – 2021-01-28 (×2): 30 mg via ORAL
  Filled 2021-01-27 (×2): qty 1

## 2021-01-27 MED ORDER — SODIUM CHLORIDE 0.9 % IV SOLN
1.0000 g | Freq: Once | INTRAVENOUS | Status: AC
  Administered 2021-01-27: 1 g via INTRAVENOUS
  Filled 2021-01-27: qty 10

## 2021-01-27 MED ORDER — LORAZEPAM 2 MG/ML IJ SOLN: 1.0000 mg | Freq: Once | INTRAMUSCULAR | Status: AC

## 2021-01-27 MED ORDER — SODIUM CHLORIDE 0.9 % IV SOLN
1.0000 g | INTRAVENOUS | Status: DC
  Filled 2021-01-27: qty 10

## 2021-01-27 MED ORDER — ACETAMINOPHEN 500 MG PO TABS: 1000.0000 mg | ORAL_TABLET | Freq: Once | ORAL | Status: DC

## 2021-01-27 MED ORDER — ATORVASTATIN CALCIUM 20 MG PO TABS
40.0000 mg | ORAL_TABLET | Freq: Every day | ORAL | Status: DC
  Administered 2021-01-27 – 2021-01-28 (×2): 40 mg via ORAL
  Filled 2021-01-27 (×2): qty 2

## 2021-01-27 MED ORDER — SODIUM CHLORIDE 0.9 % IV SOLN: INTRAVENOUS | Status: AC

## 2021-01-27 MED ORDER — ACETAMINOPHEN 325 MG PO TABS
650.0000 mg | ORAL_TABLET | Freq: Four times a day (QID) | ORAL | Status: DC | PRN
  Administered 2021-01-27: 650 mg via ORAL
  Filled 2021-01-27: qty 2

## 2021-01-27 MED ORDER — RISPERIDONE 1 MG PO TABS
1.0000 mg | ORAL_TABLET | Freq: Every day | ORAL | Status: DC
  Administered 2021-01-27: 1 mg via ORAL
  Filled 2021-01-27 (×2): qty 1

## 2021-01-27 MED ORDER — INSULIN ASPART 100 UNIT/ML ~~LOC~~ SOLN
0.0000 [IU] | Freq: Three times a day (TID) | SUBCUTANEOUS | Status: DC
  Filled 2021-01-27: qty 1

## 2021-01-27 MED ORDER — ACETAMINOPHEN 650 MG RE SUPP: 650.0000 mg | Freq: Four times a day (QID) | RECTAL | Status: DC | PRN

## 2021-01-27 MED ORDER — DIPHENHYDRAMINE HCL 50 MG/ML IJ SOLN: 25.0000 mg | Freq: Once | INTRAMUSCULAR | Status: DC

## 2021-01-27 MED ORDER — MAGNESIUM SULFATE IN D5W 1-5 GM/100ML-% IV SOLN
1.0000 g | Freq: Once | INTRAVENOUS | Status: DC
  Filled 2021-01-27: qty 100

## 2021-01-27 NOTE — ED Provider Notes (Signed)
[facility] Emergency Department Provider Note   ____________________________________________   Event Date/Time   First MD Initiated Contact with Patient 01/27/21 1121     (approximate)  I have reviewed the triage vital signs and the nursing notes.   HISTORY  Chief Complaint Altered Mental Status    HPI Pamela Norman is a 80 y.o. female with past medical history of hyperlipidemia, diabetes, depression, and endometrial cancer who presents to the ED for altered mental status.  Patient states that she woke up this morning and felt like her "body is twisted."  She states that her spine and breasts are not aligned correctly and that her left leg is twisted.  She primarily points to her epigastrium as the source of the twisting.  EMS states that they brought her arm over her body in order to place an IV during transport, after which patient stated the twisting was much worse.  Patient feels like her entire body is out of alignment and "something is wrong."  She has a hard time localizing any specific area of discomfort.  She denies any fevers, cough, chest pain, or shortness of breath.        Past Medical History:  Diagnosis Date  . Cataract   . Endometrial cancer ([place]) 2016   Grade 1,total laparoscopic hysterectomy  . History of colonoscopy 2014   within normal limits  . History of ovarian cyst 01/2015  . Hyperlipidemia   . Rectal prolapse   . Squamous cell carcinoma    face    Patient Active Problem List   Diagnosis Date Noted  . Overweight 05/13/2019  . Skin lesions 12/12/2017  . Elevated blood pressure reading 12/12/2017  . Diabetes mellitus without complication ([place]) 28/31/5176  . History of psychosis 09/17/2016  . Osteopenia 09/17/2016  . Vitamin D deficiency 09/17/2016  . History of endometrial cancer 05/04/2015  . Hyperlipidemia 05/21/2014    Past Surgical History:  Procedure Laterality Date  . ABDOMINAL HYSTERECTOMY    . APPENDECTOMY     . BILATERAL SALPINGOOPHORECTOMY  03/30/15  . CATARACT EXTRACTION W/PHACO Right 02/06/2016   Procedure: CATARACT EXTRACTION PHACO AND INTRAOCULAR LENS PLACEMENT (IOC);  Surgeon: Estill Cotta, MD;  Location: [facility] ORS;  Service: Ophthalmology;  Laterality: Right;  [place] 01:39 AP% 24.9 CDE 47.32 fluid pack lot # [id number]  . CATARACT EXTRACTION W/PHACO Left 03/05/2016   Procedure: CATARACT EXTRACTION PHACO AND INTRAOCULAR LENS PLACEMENT ([place]);  Surgeon: Estill Cotta, MD;  Location: [facility] ORS;  Service: Ophthalmology;  Laterality: Left;  [place] 01:02 AP% 23.2 CDE 26.73 fluid pack lot # [id number]  . COMBINED HYSTEROSCOPY DIAGNOSTIC / D&C  2016  . DILATION AND CURETTAGE OF UTERUS    . dnc    . LAPAROSCOPIC HYSTERECTOMY  03/30/15  . NASAL SINUS SURGERY    . SKIN CANCER EXCISION     UPPER LIP  . TONSILECTOMY, ADENOIDECTOMY, BILATERAL MYRINGOTOMY AND TUBES    . TONSILLECTOMY      Prior to Admission medications   Medication Sig Start Date End Date Taking? Authorizing Provider  atorvastatin (LIPITOR) 40 MG tablet TAKE 1 TABLET(40 MG) BY MOUTH DAILY 12/12/20   Leone Haven, MD  cholecalciferol (VITAMIN D) 1000 units tablet Take 1,000 Units by mouth daily.    [provider]  DULoxetine (CYMBALTA) 30 MG capsule Take 1 capsule (30 mg total) by mouth daily. 03/24/19   Earleen Newport, MD  metFORMIN (GLUCOPHAGE) 500 MG tablet TAKE 1 TABLET(500 MG) BY MOUTH DAILY WITH  BREAKFAST 01/16/21   Leone Haven, MD  Multiple Vitamin (MULTIVITAMIN) tablet Take 1 tablet by mouth daily.    [provider]  risperiDONE (RISPERDAL) 1 MG tablet at bedtime. As needed 10/12/15   [provider]    Allergies Patient has no known allergies.  Family History  Problem Relation Age of Onset  . Colon cancer Father        older age onset  . COPD Father   . COPD Mother   . Breast cancer Neg Hx   . Ovarian cancer Neg Hx   . Diabetes Neg Hx   . Heart disease Neg Hx     Social  History Social History   Tobacco Use  . Smoking status: Former Smoker    Packs/day: 0.25    Years: 20.00    Pack years: 5.00    Quit date: 04/13/1981    Years since quitting: 39.8  . Smokeless tobacco: Never Used  Vaping Use  . Vaping Use: Never used  Substance Use Topics  . Alcohol use: No  . Drug use: No    Review of Systems  Constitutional: No fever/chills Eyes: No visual changes. ENT: No sore throat. Cardiovascular: Denies chest pain. Respiratory: Denies shortness of breath. Gastrointestinal: Positive for abdominal pain.  No nausea, no vomiting.  No diarrhea.  No constipation. Genitourinary: Negative for dysuria. Musculoskeletal: Negative for back pain. Skin: Negative for rash. Neurological: Negative for headaches, focal weakness or numbness.  ____________________________________________   PHYSICAL EXAM:  VITAL SIGNS: ED Triage Vitals [01/27/21 1121]  Enc Vitals Group     BP      Pulse      Resp      Temp      Temp src      SpO2      Weight 185 lb 3 oz (84 kg)     Height 5\' 6"  (1.676 m)     Head Circumference      Peak Flow      Pain Score 10     Pain Loc      Pain Edu?      Excl. in [place]?     Constitutional: Alert and oriented to person, place, time, and situation. Eyes: Conjunctivae are normal. Head: Atraumatic. Nose: No congestion/rhinnorhea. Mouth/Throat: Mucous membranes are moist. Neck: Normal ROM Cardiovascular: Normal rate, regular rhythm. Grossly normal heart sounds. Respiratory: Normal respiratory effort.  No retractions. Lungs CTAB. Gastrointestinal: Soft and nontender. No distention. Genitourinary: deferred Musculoskeletal: No lower extremity tenderness nor edema. Neurologic:  Normal speech and language. No gross focal neurologic deficits are appreciated. Skin:  Skin is warm, dry and intact. No rash noted. Psychiatric: Agitated and anxious appearing. Speech and behavior are normal.  ____________________________________________    LABS (all labs ordered are listed, but only abnormal results are displayed)  Labs Reviewed  COMPREHENSIVE METABOLIC PANEL - Abnormal; Notable for the following components:      Result Value   Glucose, Bld 154 (*)    All other components within normal limits  URINALYSIS, COMPLETE (UACMP) WITH MICROSCOPIC - Abnormal; Notable for the following components:   Color, Urine YELLOW (*)    APPearance CLEAR (*)    Ketones, ur 20 (*)    Nitrite POSITIVE (*)    Bacteria, UA MANY (*)    All other components within normal limits  URINE CULTURE  SARS CORONAVIRUS 2 (TAT 6-24 HRS)  CBC WITH DIFFERENTIAL/PLATELET  LIPASE, BLOOD  URINE DRUG SCREEN, QUALITATIVE ([facility] ONLY)  TROPONIN I (  HIGH SENSITIVITY)  TROPONIN I (HIGH SENSITIVITY)   ____________________________________________  EKG  ED ECG REPORT I, Blake Divine, the attending physician, personally viewed and interpreted this ECG.   Date: 01/27/2021  EKG Time: 11:19  Rate: 100  Rhythm: sinus tachycardia  Axis: Normal  Intervals:right bundle branch block  ST&T Change: None   PROCEDURES  Procedure(s) performed (including Critical Care):  Procedures   ____________________________________________   INITIAL IMPRESSION / ASSESSMENT AND PLAN / ED COURSE       80 year old female with past medical history of hyperlipidemia, diabetes, depression, and endometrial cancer who presents to the ED for altered mental status, continually states that "my body is twisted, something is wrong."  There is no obvious deformity on patient's body and she has having a hard time localizing any specific source of pain.  She is not tender anywhere in her abdomen or chest.  EKG shows no evidence of arrhythmia or ischemia, troponin is negative.  Additional labs, including LFTs and lipase are unremarkable.  UA is questionable for UTI with positive nitrites, we will send for culture and treat with Rocephin for now.  Patient does have a history of depression  and it is possible she is developing some psychotic features with this.  It appears she would benefit from medical admission with psychiatric consultation, case discussed with hospitalist for admission.      ____________________________________________   FINAL CLINICAL IMPRESSION(S) / ED DIAGNOSES  Final diagnoses:  Encephalopathy  Acute cystitis without hematuria     ED Discharge Orders    None       Note:  This document was prepared using Dragon voice recognition software and may include unintentional dictation errors.   Blake Divine, MD 01/27/21 1407

## 2021-01-27 NOTE — Plan of Care (Signed)

## 2021-01-27 NOTE — H&P (Signed)
History and Physical    PLEASE NOTE THAT DRAGON DICTATION SOFTWARE WAS USED IN THE CONSTRUCTION OF THIS NOTE.   Pamela Norman YSA:[id number] DOB: 05/16/1941 DOA: 01/27/2021  PCP: Leone Haven, MD Patient coming from: home   I have personally briefly reviewed patient's old medical records in [place]  Chief Complaint: "twisting sensation throughout body"  HPI: Pamela Norman is a 80 y.o. female with medical history significant for type 2 diabetes mellitus, hyperlipidemia, who is admitted to [facility] on 01/27/2021 for acute encephalopathy after presenting to [facility] ED for evaluation of twisting sensation throughout body.   The patient reports that she has been experiencing persistent " twisting sensation throughout her upper torso radiating down. And terminating in the bilateral lower extremities at the level of her feet starting on the morning of 01/27/2021. she denies any associated numbness or tingling, and denies any overt discomfort, rather she conveys the "sensation of twisting".  Denies any recent trauma, fall, injury.  Denies any associated headache, neck stiffness, acute change in vision, vertigo, slurred speech, facial droop, dysphagia.  No recent subjective fever, chills, rigors, or generalized myalgias.  She does however report 1 to 2 days of dysuria in the absence of associated gross hematuria or change in urinary urgency/frequency.  Denies any associated abdominal discomfort.   Denies any recent chest pain, shortness of breath, palpitations, diaphoresis, nausea, vomiting.  Denies any recent modifications to her home medication regimen over the last month, including no additions or corrections or modifications of existing doses.   Denies any recent rhinitis, rhinorrhea, sore throat, wheezing, rash.  No recent traveling or known COVID-19 exposures.  Medical history notable for type 2 diabetes mellitus, for which she is on as an outpatient.  No use of insulin at home.  Per  chart review, there is also some documentation of a history of psychosis without additional psychiatric diagnosis aside from depression for which she reports taking Cymbalta as an outpatient.  Denies any visual or auditory hallucinations.  Denies any suicidal or homicidal ideations.  The patient lives herself, and reports that she nemesis earlier today for assistance with the "twisting sensation throughout her body".     ED Course:  Vital signs in the ED were notable for the following: Tetramex 98.4, heart rate 70-89; blood pressure 134/90 7-1 60/77; respiratory rate 14-23, oxygen saturation 96 to 100% on room air.   Labs were notable for the following: CMP was notable for sodium 137, bicarbonate 24, BUN 23, creatinine 0.78, glucose 154.  CBC notable for white blood cell count of 6400.  Urinalysis showed many bacteria, nitrate positive, no evidence of squamous epithelial cells, and was also notable for 20 ketones.  Urine culture was added onto the sample. urinary drug screen was pan negative.  Nasopharyngeal COVID-19 PCR was performed in the emergency department today, with result currently pending.  EKG showed sinus tachycardia with heart rate 100, right bundle branch block, QRS of 130, QTc 509, and no T wave or ST changes, including no evidence of ST elevation.  Noncontrast CT of the head showed no evidence of acute intracranial process, but did show evidence of mild cerebral atrophy and chronic small vessel ischemic changes.  Chest x-ray showed no evidence of acute cardiopulmonary process, clear no evidence of infiltrate, edema, effusion, or pneumothorax.  The emergency department physician formally consulted the on-call psychiatrist for evaluation and management of patient psychosis, with ensuing evaluation currently pending.  While in the ED, the following were administered: Ativan  1 mg IV x1, Rocephin 1 g IV x1.    Review of Systems: As per HPI otherwise 10 point review of systems negative.    Past Medical History:  Diagnosis Date  . Cataract   . Endometrial cancer ([place]) 2016   Grade 1,total laparoscopic hysterectomy  . History of colonoscopy 2014   within normal limits  . History of ovarian cyst 01/2015  . Hyperlipidemia   . Rectal prolapse   . Squamous cell carcinoma    face    Past Surgical History:  Procedure Laterality Date  . ABDOMINAL HYSTERECTOMY    . APPENDECTOMY    . BILATERAL SALPINGOOPHORECTOMY  03/30/15  . CATARACT EXTRACTION W/PHACO Right 02/06/2016   Procedure: CATARACT EXTRACTION PHACO AND INTRAOCULAR LENS PLACEMENT (IOC);  Surgeon: Estill Cotta, MD;  Location: [facility] ORS;  Service: Ophthalmology;  Laterality: Right;  [place] 01:39 AP% 24.9 CDE 47.32 fluid pack lot # [id number]  . CATARACT EXTRACTION W/PHACO Left 03/05/2016   Procedure: CATARACT EXTRACTION PHACO AND INTRAOCULAR LENS PLACEMENT ([place]);  Surgeon: Estill Cotta, MD;  Location: [facility] ORS;  Service: Ophthalmology;  Laterality: Left;  [place] 01:02 AP% 23.2 CDE 26.73 fluid pack lot # [id number]  . COMBINED HYSTEROSCOPY DIAGNOSTIC / D&C  2016  . DILATION AND CURETTAGE OF UTERUS    . dnc    . LAPAROSCOPIC HYSTERECTOMY  03/30/15  . NASAL SINUS SURGERY    . SKIN CANCER EXCISION     UPPER LIP  . TONSILECTOMY, ADENOIDECTOMY, BILATERAL MYRINGOTOMY AND TUBES    . TONSILLECTOMY      Social History:  reports that she quit smoking about 39 years ago. She has a 5.00 pack-year smoking history. She has never used smokeless tobacco. She reports that she does not drink alcohol and does not use drugs.   No Known Allergies  Family History  Problem Relation Age of Onset  . Colon cancer Father        older age onset  . COPD Father   . COPD Mother   . Breast cancer Neg Hx   . Ovarian cancer Neg Hx   . Diabetes Neg Hx   . Heart disease Neg Hx      Prior to Admission medications   Medication Sig Start Date End Date Taking? Authorizing Provider  atorvastatin (LIPITOR) 40 MG tablet TAKE 1 TABLET(40 MG)  BY MOUTH DAILY Patient taking differently: Take 40 mg by mouth daily. 12/12/20  Yes Leone Haven, MD  cholecalciferol (VITAMIN D) 1000 units tablet Take 1,000 Units by mouth daily.   Yes [provider]  DULoxetine (CYMBALTA) 30 MG capsule Take 1 capsule (30 mg total) by mouth daily. 03/24/19  Yes Earleen Newport, MD  metFORMIN (GLUCOPHAGE) 500 MG tablet TAKE 1 TABLET(500 MG) BY MOUTH DAILY WITH BREAKFAST Patient taking differently: Take 500 mg by mouth daily with breakfast. 01/16/21  Yes Leone Haven, MD  Multiple Vitamin (MULTIVITAMIN) tablet Take 1 tablet by mouth daily.   Yes [provider]  risperiDONE (RISPERDAL) 1 MG tablet Take 1 mg by mouth at bedtime. As needed 10/12/15  Yes [provider]     Objective    Physical Exam: Vitals:   01/27/21 1432 01/27/21 1600 01/27/21 1700 01/27/21 1736  BP: (!) 160/77 (!) 136/93 (!) 159/74 (!) 155/73  Pulse: 78 83 92 88  Resp: 17 17 (!) 23 18  Temp:    (!) 97.5 F (36.4 C)  TempSrc:    Oral  SpO2: 100% 98% 96% 100%  Weight:      Height:        General: appears to be stated age; alert, oriented x 4 Skin: warm, dry, no rash Head:  AT/[place] Mouth:  Oral mucosa membranes appear dry, normal dentition Neck: supple; trachea midline Heart:  RRR; did not appreciate any M/R/G Lungs: CTAB, did not appreciate any wheezes, rales, or rhonchi Abdomen: + BS; soft, ND, NT Vascular: 2+ pedal pulses b/l; 2+ radial pulses b/l Extremities: no peripheral edema, no muscle wasting Neuro: strength and sensation intact in upper and lower extremities b/l    Labs on Admission: I have personally reviewed following labs and imaging studies  CBC: Recent Labs  Lab 01/27/21 1147  WBC 6.4  NEUTROABS 4.4  HGB 13.4  HCT 41.2  MCV 92.2  PLT 767   Basic Metabolic Panel: Recent Labs  Lab 01/27/21 1147  NA 137  K 4.3  CL 103  CO2 24  GLUCOSE 154*  BUN 23  CREATININE 0.78  CALCIUM 9.0   GFR: Estimated  Creatinine Clearance: 63.3 mL/min (by C-G formula based on SCr of 0.78 mg/dL). Liver Function Tests: Recent Labs  Lab 01/27/21 1147  AST 21  ALT 19  ALKPHOS 75  BILITOT 1.1  PROT 7.3  ALBUMIN 4.3   Recent Labs  Lab 01/27/21 1147  LIPASE 34   No results for input(s): AMMONIA in the last 168 hours. Coagulation Profile: No results for input(s): INR, PROTIME in the last 168 hours. Cardiac Enzymes: No results for input(s): CKTOTAL, CKMB, CKMBINDEX, TROPONINI in the last 168 hours. BNP (last 3 results) No results for input(s): PROBNP in the last 8760 hours. HbA1C: No results for input(s): HGBA1C in the last 72 hours. CBG: No results for input(s): GLUCAP in the last 168 hours. Lipid Profile: No results for input(s): CHOL, HDL, LDLCALC, TRIG, CHOLHDL, LDLDIRECT in the last 72 hours. Thyroid Function Tests: No results for input(s): TSH, T4TOTAL, FREET4, T3FREE, THYROIDAB in the last 72 hours. Anemia Panel: No results for input(s): VITAMINB12, FOLATE, FERRITIN, TIBC, IRON, RETICCTPCT in the last 72 hours. Urine analysis:    Component Value Date/Time   COLORURINE YELLOW (A) 01/27/2021 1216   APPEARANCEUR CLEAR (A) 01/27/2021 1216   APPEARANCEUR Clear 01/03/2015 1747   LABSPEC 1.011 01/27/2021 1216   LABSPEC 1.008 01/03/2015 1747   PHURINE 6.0 01/27/2021 1216   GLUCOSEU NEGATIVE 01/27/2021 1216   GLUCOSEU Negative 01/03/2015 1747   HGBUR NEGATIVE 01/27/2021 1216   BILIRUBINUR NEGATIVE 01/27/2021 1216   BILIRUBINUR Negative 01/03/2015 1747   KETONESUR 20 (A) 01/27/2021 1216   PROTEINUR NEGATIVE 01/27/2021 1216   NITRITE POSITIVE (A) 01/27/2021 1216   LEUKOCYTESUR NEGATIVE 01/27/2021 1216   LEUKOCYTESUR 2+ 01/03/2015 1747    Radiological Exams on Admission: DG Chest 2 View  Result Date: 01/27/2021 CLINICAL DATA:  Chest pain. EXAM: CHEST - 2 VIEW COMPARISON:  July 31, 2015. FINDINGS: The heart size and mediastinal contours are within normal limits. Both lungs are clear.  No pneumothorax or pleural effusion is noted. The visualized skeletal structures are unremarkable. IMPRESSION: No active cardiopulmonary disease. Electronically Signed   By: Marijo Conception M.D.   On: 01/27/2021 14:25   CT Head Wo Contrast  Result Date: 01/27/2021 CLINICAL DATA:  Mental status change, unknown cause. EXAM: CT HEAD WITHOUT CONTRAST TECHNIQUE: Contiguous axial images were obtained from the base of the skull through the vertex without intravenous contrast. COMPARISON:  Brain MRI 03/22/2019. FINDINGS: Brain: Mild cerebral atrophy. Mild ill-defined hypoattenuation within the cerebral white  matter is nonspecific, but compatible with chronic small vessel ischemic disease. There is no acute intracranial hemorrhage. No demarcated cortical infarct. No extra-axial fluid collection. No evidence of intracranial mass. No midline shift. Vascular: No hyperdense vessel.  Atherosclerotic calcifications. Skull: Normal. Negative for fracture or focal suspicious osseous lesion. Sinuses/Orbits: Visualized orbits show no acute finding. No significant paranasal sinus disease at the imaged levels. IMPRESSION: No evidence of acute intracranial abnormality. Mild cerebral atrophy and chronic small vessel ischemic disease. Electronically Signed   By: Kellie Simmering DO   On: 01/27/2021 12:20     EKG: Independently reviewed, with result as described above.    Assessment/Plan   Pamela Norman is a 80 y.o. female with medical history significant for type 2 diabetes mellitus, hyperlipidemia, who is admitted to [facility] on 01/27/2021 for acute encephalopathy after presenting to [facility] ED for evaluation of twisting sensation throughout body.    Principal Problem:   Acute encephalopathy Active Problems:   Hyperlipidemia   Diabetes mellitus without complication (HCC)   Acute cystitis   Dehydration   Acute prerenal azotemia   Prolonged QT interval     #) Acute metabolic encephalopathy: while the patient appears to  exhibit some confusion regarding kinesthetic properties of her body and manifestation, she is also found to be oriented to person, place, self, and time.  Unclear if the above confusion represents encephalopathy due to metabolic contributions from presenting urinary tract infection with exacerbation due to clinical evidence of mild dehydration versus an element of psychosis given a documented prior history of such.  No additional metabolic source for patient's condition is identified at this time, including no additional source of underlying infectious process of, including chest x-ray showed no evidence of acute cardiopulmonary process.  Of note, result of screening COVID-19 PCR performed today is pending at this time.  Noncontrast CT of the head and evidence of acute intracranial process, and the patient demonstrates no evidence of acute focal neurologic deficits, thereby rendering the possibility of acute ischemic CVA to be much less likely.  Presentation does not appear to be consistent with seizures.  Urinary drug screen end of the pan negative.  Psychiatry consulted in the emergency department, as above, for assistance with evaluation for element of psychosis, as above.  Plan: Work-up and management of presenting urinary tract infection, as further described below.  Gentle resuscitative measures, as further described below.  Repeat BMP in the morning.  Check TSH, VBG, ammonia, CPK.  Also check INR for evaluation of the hepatic synthetic function.  Repeat CBC with differential in the morning follow-up on result of presenting screening nasopharyngeal COVID-19 PCR result.  We will hold home central acting medications for now, including Cymbalta as well as as needed risperidone.  Psychiatry consultation, as above.     #) Acute cystitis: Diagnosis on the basis of presenting 1 to 2 days of dysuria, with presenting urinalysis showing evidence of many bacteria, nitrate positive, and no evidence of contamination  in the absence of any squamous epithelial cells.  Does not meet any SIRS criteria for presentation to meet criteria for sepsis.  No evidence of hypotension.  Urine sample sent for culture followed by initiation of empiric Rocephin.  Plan: Check blood cultures x2.  Monitor for results of urine culture.  Continue Rocephin.  Repeat CBC with differential in the morning.     #) Dehydration: Clinical evidence to suggest mild dehydration in the form of physical exam evidence of dry oral mucous membranes as well as  laboratory evidence to suggest prerenal azotemia.  This appears to be in the context of recent decline in oral intake, per the patient's report over the last 1 day.  Does not appear to be associated with acute kidney injury.  Gentle IV fluids overnight in the form of normal saline at 50 cc/h x 12 hours.  Monitor strict I's and O's and daily weights.  Repeat BMP in the morning.      #) QTC prolongation: Presenting EKG findings were notable for QTC of 509, with potential contribution from Cymbalta.   Plan: Add on serum magnesium level, with plan for as needed supplementation to maintain serum magnesium level greater than or equal to 2.0.  We will hold Cymbalta and risperidone for now.  A repeat EKG has been ordered for tomorrow morning to trend degree of QTC prolongation.       #) Type 2 diabetes mellitus: On metformin as well outpatient oral hypoglycemic agent.  Not on any insulin as an outpatient for presenting blood sugar per presenting CMP noted to be 154.  Plan: We will hold home Metformin during this hospitalization.  Accu-Cheks before every meal and at bedtime with low-dose sliding scale insulin.  Check hemoglobin A1c in the morning.     #) Hyperlipidemia: On high intensity atorvastatin as an outpatient.  Plan: Continue home statin.    DVT prophylaxis: scd's  Code Status: Full code Family Communication: none Disposition Plan: Per Rounding Team Consults called:  Psychiatry consult placed in the ED, as further described above Admission status: inpatient; med-surg    Of note, this patient was added by me to the following Admit List/Treatment Team:  armcadmits     PLEASE NOTE THAT DRAGON DICTATION SOFTWARE WAS USED IN THE CONSTRUCTION OF THIS NOTE.   [place] [facility] Pager [phone] From 12PM - 12AM  Otherwise, please contact night-coverage  [email or web address] Password [facility]   01/27/2021, 6:17 PM

## 2021-01-27 NOTE — ED Triage Notes (Addendum)
pt arrive via ems from at home where she was alone. EMS reports pt thinks "body is twisted". Pt confused and  argumentative with ems. tearful and upset on arrival, continuously repeating "my body is twisted, and its wrong". MD present for assessment. Pt a&o x 4.  Ems vitals 186/77 158 cbg 99%

## 2021-01-27 NOTE — Consult Note (Signed)
[facility] Face-to-Face Psychiatry Consult   Reason for Consult: Consult for this 80 year old woman with history of delusions who presented to the emergency room once again with her typical delusion Referring Physician:  Howerter Patient Identification: Pamela Norman MRN:  [id number] Principal Diagnosis: Delusional disorder [facility]) Diagnosis:  Principal Problem:   Delusional disorder ([place]) Active Problems:   Hyperlipidemia   Diabetes mellitus without complication ([place])   Acute cystitis   Dehydration   Acute prerenal azotemia   Prolonged QT interval   Total Time spent with patient: 1 hour  Subjective:   Pamela Norman is a 80 y.o. female patient admitted with "it is just out of alignment".  HPI: Patient seen chart reviewed.  Patient is familiar to me from previous contacts I have had with her.  This is a 80 year old woman who came to the emergency room today complaining that her body was "twisted" or out of alignment.  As usual she has a very difficult time explaining exactly what that means but does seem to be troubled by it.  I talked with her about it this evening and she tells me that last night she had noticed some of that but this morning when she woke up it was very bad.  The twisting and being out of alignment she felt was quite disturbing.  Despite what ever this feeling is she was able to walk without difficulty.  No dizziness no falls.  Patient denies that there have been any recent changes in her medicine.  She denies that she has been feeling anxious or stressed.  In fact we had a nice chat about how much she is enjoying living in an assisted living facility where she can keep her cat and have social activities.  Patient denies feeling depressed.  Denies hallucinations or any other psychotic symptoms.  1 has to talk about this with her very gently as she knows that people in the past have considered it a mental health problem and she is quite convinced that that is not the case.  She tells  me several times that what she has is not in her head.  She does say that she still taking her Cymbalta.  She admits that her Risperdal is taken with less regularity.  I have some suspicion that she may be taking both of them with less regularity.  I was not able to identify any particular stressor or reason why the symptom may have recurred.  I have noticed in the past that sometimes she will described some kind of physical event in which she felt that her movements of her body were asymmetric that seems to set it off.  She told me today that recently she had been trying to comb her hair with both hands and had felt like it was asymmetric and off balance.  Past Psychiatric History: Patient has a history of presenting with exactly this delusion.  I saw her several times in the year 2016 with exactly the same complaint.  There was never any identification of any physical problem from it.  Dr. Meredith Pel saw her in early 2020 for exactly the same thing.  She has been on low-dose Cymbalta and Risperdal for years which seemed to be of at least some benefit of keeping the problem under control.  As far as I know this problem has never advanced anything more severe it and usually goes away in a short time by itself.  No history of suicide attempts or violence.  Risk to Self:  Risk to Others:   Prior Inpatient Therapy:   Prior Outpatient Therapy:    Past Medical History:  Past Medical History:  Diagnosis Date  . Cataract   . Cervical stenosis of spine   . Endometrial cancer ([place]) 2016   Grade 1,total laparoscopic hysterectomy  . History of colonoscopy 2014   within normal limits  . History of ovarian cyst 01/2015  . Hyperlipidemia   . Rectal prolapse   . Squamous cell carcinoma    face    Past Surgical History:  Procedure Laterality Date  . ABDOMINAL HYSTERECTOMY    . APPENDECTOMY    . BILATERAL SALPINGOOPHORECTOMY  03/30/15  . CATARACT EXTRACTION W/PHACO Right 02/06/2016   Procedure: CATARACT  EXTRACTION PHACO AND INTRAOCULAR LENS PLACEMENT (IOC);  Surgeon: Estill Cotta, MD;  Location: [facility] ORS;  Service: Ophthalmology;  Laterality: Right;  [place] 01:39 AP% 24.9 CDE 47.32 fluid pack lot # [id number]  . CATARACT EXTRACTION W/PHACO Left 03/05/2016   Procedure: CATARACT EXTRACTION PHACO AND INTRAOCULAR LENS PLACEMENT ([place]);  Surgeon: Estill Cotta, MD;  Location: [facility] ORS;  Service: Ophthalmology;  Laterality: Left;  [place] 01:02 AP% 23.2 CDE 26.73 fluid pack lot # [id number]  . COMBINED HYSTEROSCOPY DIAGNOSTIC / D&C  2016  . DILATION AND CURETTAGE OF UTERUS    . dnc    . LAPAROSCOPIC HYSTERECTOMY  03/30/15  . NASAL SINUS SURGERY    . SKIN CANCER EXCISION     UPPER LIP  . TONSILECTOMY, ADENOIDECTOMY, BILATERAL MYRINGOTOMY AND TUBES    . TONSILLECTOMY     Family History:  Family History  Problem Relation Age of Onset  . Colon cancer Father        older age onset  . COPD Father   . COPD Mother   . Breast cancer Neg Hx   . Ovarian cancer Neg Hx   . Diabetes Neg Hx   . Heart disease Neg Hx    Family Psychiatric  History: None reported Social History:  Social History   Substance and Sexual Activity  Alcohol Use No     Social History   Substance and Sexual Activity  Drug Use No    Social History   Socioeconomic History  . Marital status: Single    Spouse name: Not on file  . Number of children: 0  . Years of education: PhD  . Highest education level: Not on file  Occupational History  . Occupation: Retired  Tobacco Use  . Smoking status: Former Smoker    Packs/day: 0.25    Years: 20.00    Pack years: 5.00    Quit date: 04/13/1981    Years since quitting: 39.8  . Smokeless tobacco: Never Used  Vaping Use  . Vaping Use: Never used  Substance and Sexual Activity  . Alcohol use: No  . Drug use: No  . Sexual activity: Not Currently  Other Topics Concern  . Not on file  Social History Narrative   Lives in [place]. No children.      Retired [employer]       Diet - regular diet, herbalife      Exercise - none regular, [employer]      Right-handed.      1-2 cups caffeine daily.   Social Determinants of Health   Financial Resource Strain: Low Risk   . Difficulty of Paying Living Expenses: Not hard at all  Food Insecurity: No Food Insecurity  . Worried About Charity fundraiser in the  Last Year: Never true  . Ran Out of Food in the Last Year: Never true  Transportation Needs: No Transportation Needs  . Lack of Transportation (Medical): No  . Lack of Transportation (Non-Medical): No  Physical Activity: Not on file  Stress: No Stress Concern Present  . Feeling of Stress : Not at all  Social Connections: Unknown  . Frequency of Communication with Friends and Family: More than three times a week  . Frequency of Social Gatherings with Friends and Family: More than three times a week  . Attends Religious Services: Not on file  . Active Member of Clubs or Organizations: Not on file  . Attends Archivist Meetings: Not on file  . Marital Status: Not on file   Additional Social History:    Allergies:  No Known Allergies  Labs:  Results for orders placed or performed during the hospital encounter of 01/27/21 (from the past 48 hour(s))  CBC with Differential     Status: None   Collection Time: 01/27/21 11:47 AM  Result Value Ref Range   WBC 6.4 4.0 - 10.5 K/uL   RBC 4.47 3.87 - 5.11 MIL/uL   Hemoglobin 13.4 12.0 - 15.0 g/dL   HCT 41.2 36.0 - 46.0 %   MCV 92.2 80.0 - 100.0 fL   MCH 30.0 26.0 - 34.0 pg   MCHC 32.5 30.0 - 36.0 g/dL   RDW 13.6 11.5 - 15.5 %   Platelets 231 150 - 400 K/uL   nRBC 0.0 0.0 - 0.2 %   Neutrophils Relative % 67 %   Neutro Abs 4.4 1.7 - 7.7 K/uL   Lymphocytes Relative 22 %   Lymphs Abs 1.4 0.7 - 4.0 K/uL   Monocytes Relative 8 %   Monocytes Absolute 0.5 0.1 - 1.0 K/uL   Eosinophils Relative 1 %   Eosinophils Absolute 0.1 0.0 - 0.5 K/uL   Basophils Relative 1 %    Basophils Absolute 0.0 0.0 - 0.1 K/uL   Immature Granulocytes 1 %   Abs Immature Granulocytes 0.03 0.00 - 0.07 K/uL    Comment: Performed at [facility], [address] [place], [place] [address]  Comprehensive metabolic panel     Status: Abnormal   Collection Time: 01/27/21 11:47 AM  Result Value Ref Range   Sodium 137 135 - 145 mmol/L   Potassium 4.3 3.5 - 5.1 mmol/L    Comment: HEMOLYSIS AT THIS LEVEL MAY AFFECT RESULT   Chloride 103 98 - 111 mmol/L   CO2 24 22 - 32 mmol/L   Glucose, Bld 154 (H) 70 - 99 mg/dL    Comment: Glucose reference range applies only to samples taken after fasting for at least 8 hours.   BUN 23 8 - 23 mg/dL   Creatinine, Ser 0.78 0.44 - 1.00 mg/dL   Calcium 9.0 8.9 - 10.3 mg/dL   Total Protein 7.3 6.5 - 8.1 g/dL   Albumin 4.3 3.5 - 5.0 g/dL   AST 21 15 - 41 U/L   ALT 19 0 - 44 U/L   Alkaline Phosphatase 75 38 - 126 U/L   Total Bilirubin 1.1 0.3 - 1.2 mg/dL   GFR, Estimated >60 >60 mL/min    Comment: (NOTE) Calculated using the CKD-EPI Creatinine Equation (2021)    Anion gap 10 5 - 15    Comment: Performed at [facility], [name], [place], [place] [address]  Troponin I (High Sensitivity)     Status: None   Collection  Time: 01/27/21 11:47 AM  Result Value Ref Range   Troponin I (High Sensitivity) 4 <18 ng/L    Comment: (NOTE) Elevated high sensitivity troponin I (hsTnI) values and significant  changes across serial measurements may suggest ACS but many other  chronic and acute conditions are known to elevate hsTnI results.  Refer to the "Links" section for chest pain algorithms and additional  guidance. Performed at [facility], [address] [place], [place] [address]   Lipase, blood     Status: None   Collection Time: 01/27/21 11:47 AM  Result Value Ref Range   Lipase 34 11 - 51 U/L    Comment: Performed at [facility], [address] [place], [place] [address]  Urinalysis, Complete w  Microscopic Urine, Catheterized     Status: Abnormal   Collection Time: 01/27/21 12:16 PM  Result Value Ref Range   Color, Urine YELLOW (A) YELLOW   APPearance CLEAR (A) CLEAR   Specific Gravity, Urine 1.011 1.005 - 1.030   pH 6.0 5.0 - 8.0   Glucose, UA NEGATIVE NEGATIVE mg/dL   Hgb urine dipstick NEGATIVE NEGATIVE   Bilirubin Urine NEGATIVE NEGATIVE   Ketones, ur 20 (A) NEGATIVE mg/dL   Protein, ur NEGATIVE NEGATIVE mg/dL   Nitrite POSITIVE (A) NEGATIVE   Leukocytes,Ua NEGATIVE NEGATIVE   RBC / HPF 0-5 0 - 5 RBC/hpf   WBC, UA 0-5 0 - 5 WBC/hpf   Bacteria, UA MANY (A) NONE SEEN   Squamous Epithelial / LPF 0-5 0 - 5   Mucus PRESENT     Comment: Performed at [facility], [address] [place], [place] [address]  Urine Drug Screen, Qualitative     Status: None   Collection Time: 01/27/21 12:16 PM  Result Value Ref Range   Tricyclic, Ur Screen NONE DETECTED NONE DETECTED   Amphetamines, Ur Screen NONE DETECTED NONE DETECTED   MDMA (Ecstasy)Ur Screen NONE DETECTED NONE DETECTED   Cocaine Metabolite,Ur [place] NONE DETECTED NONE DETECTED   Opiate, Ur Screen NONE DETECTED NONE DETECTED   Phencyclidine (PCP) Ur S NONE DETECTED NONE DETECTED   Cannabinoid 50 Ng, Ur [place] NONE DETECTED NONE DETECTED   Barbiturates, Ur Screen NONE DETECTED NONE DETECTED   Benzodiazepine, Ur Scrn NONE DETECTED NONE DETECTED   Methadone Scn, Ur NONE DETECTED NONE DETECTED    Comment: (NOTE) Tricyclics + metabolites, urine    Cutoff 1000 ng/mL Amphetamines + metabolites, urine  Cutoff 1000 ng/mL MDMA (Ecstasy), urine              Cutoff 500 ng/mL Cocaine Metabolite, urine          Cutoff 300 ng/mL Opiate + metabolites, urine        Cutoff 300 ng/mL Phencyclidine (PCP), urine         Cutoff 25 ng/mL Cannabinoid, urine                 Cutoff 50 ng/mL Barbiturates + metabolites, urine  Cutoff 200 ng/mL Benzodiazepine, urine              Cutoff 200 ng/mL Methadone, urine                   Cutoff 300  ng/mL  The urine drug screen provides only a preliminary, unconfirmed analytical test result and should not be used for non-medical purposes. Clinical consideration and professional judgment should be applied to any positive drug screen result due to possible interfering substances. A more specific  alternate chemical method must be used in order to obtain a confirmed analytical result. Gas chromatography / mass spectrometry (GC/MS) is the preferred confirm atory method. Performed at [facility], [place], [place] [address]   Troponin I (High Sensitivity)     Status: None   Collection Time: 01/27/21  1:53 PM  Result Value Ref Range   Troponin I (High Sensitivity) 4 <18 ng/L    Comment: (NOTE) Elevated high sensitivity troponin I (hsTnI) values and significant  changes across serial measurements may suggest ACS but many other  chronic and acute conditions are known to elevate hsTnI results.  Refer to the "Links" section for chest pain algorithms and additional  guidance. Performed at [facility], [address] [place], [place] [address]   Magnesium     Status: None   Collection Time: 01/27/21  1:53 PM  Result Value Ref Range   Magnesium 2.1 1.7 - 2.4 mg/dL    Comment: Performed at [facility], [address] [place], [place] [address]  CK     Status: None   Collection Time: 01/27/21  6:50 PM  Result Value Ref Range   Total CK 89 38 - 234 U/L    Comment: Performed at [facility], [address] [place], [place] [address]    Current Facility-Administered Medications  Medication Dose Route Frequency Provider Last Rate Last Admin  . 0.9 %  sodium chloride infusion   Intravenous Continuous Howerter, Justin B, DO 50 mL/hr at 01/27/21 1936 New Bag at 01/27/21 1936  . acetaminophen (TYLENOL) tablet 650 mg  650 mg Oral Q6H PRN Howerter, Justin B, DO   650 mg at 01/27/21 1937   Or  . acetaminophen (TYLENOL) suppository 650  mg  650 mg Rectal Q6H PRN Howerter, Justin B, DO      . atorvastatin (LIPITOR) tablet 40 mg  40 mg Oral Daily Howerter, Justin B, DO   40 mg at 01/27/21 1935  . [START ON 01/28/2021] cefTRIAXone (ROCEPHIN) 1 g in sodium chloride 0.9 % 100 mL IVPB  1 g Intravenous Q24H Howerter, Justin B, DO      . DULoxetine (CYMBALTA) DR capsule 30 mg  30 mg Oral Daily Camaya [employer], Madie Reno, MD      . Derrill Memo ON 01/28/2021] insulin aspart (novoLOG) injection 0-6 Units  0-6 Units Subcutaneous TID WC Howerter, Justin B, DO      . risperiDONE (RISPERDAL) tablet 1 mg  1 mg Oral QHS Dorothe Elmore, Madie Reno, MD        Musculoskeletal: Strength & Muscle Tone: within normal limits Gait & Station: normal Patient leans: N/A  Psychiatric Specialty Exam: Physical Exam Vitals and nursing note reviewed.  Constitutional:      Appearance: She is well-developed and well-nourished.  HENT:     Head: Normocephalic and atraumatic.  Eyes:     Conjunctiva/sclera: Conjunctivae normal.     Pupils: Pupils are equal, round, and reactive to light.  Cardiovascular:     Heart sounds: Normal heart sounds.  Pulmonary:     Effort: Pulmonary effort is normal.  Abdominal:     Palpations: Abdomen is soft.  Musculoskeletal:        General: Normal range of motion.     Cervical back: Normal range of motion.  Skin:    General: Skin is warm and dry.  Neurological:     General: No focal deficit present.     Mental Status: She is alert.  Psychiatric:  Attention and Perception: Attention normal.        Mood and Affect: Mood normal.        Speech: Speech normal.        Behavior: Behavior is cooperative.        Thought Content: Thought content is delusional. Thought content does not include homicidal or suicidal ideation.        Cognition and Memory: Cognition is impaired.        Judgment: Judgment normal.     Review of Systems  Constitutional: Negative.   HENT: Negative.   Eyes: Negative.   Respiratory: Negative.   Cardiovascular:  Negative.   Gastrointestinal: Negative.   Musculoskeletal: Negative.   Skin: Negative.   Neurological: Negative.   Psychiatric/Behavioral: Negative for agitation, behavioral problems, confusion, decreased concentration, dysphoric mood, hallucinations, self-injury, sleep disturbance and suicidal ideas. The patient is nervous/anxious. The patient is not hyperactive.     Blood pressure (!) 155/73, pulse 88, temperature (!) 97.5 F (36.4 C), temperature source Oral, resp. rate 18, height 5\' 6"  (1.676 m), weight 84 kg, SpO2 100 %.Body mass index is 29.89 kg/m.  General Appearance: Casual  Eye Contact:  Good  Speech:  Clear and Coherent  Volume:  Normal  Mood:  Euthymic  Affect:  Congruent  Thought Process:  Goal Directed  Orientation:  Full (Time, Place, and Person)  Thought Content:  Logical  Suicidal Thoughts:  No  Homicidal Thoughts:  No  Memory:  Immediate;   Fair Recent;   Fair Remote;   Fair  Judgement:  Impaired  Insight:  Shallow  Psychomotor Activity:  Normal  Concentration:  Concentration: Fair  Recall:  [employer] of Knowledge:  Fair  Language:  Fair  Akathisia:  No  Handed:  Right  AIMS (if indicated):     Assets:  Desire for Improvement Housing Physical Health Resilience  ADL's:  Impaired  Cognition:  WNL  Sleep:        Treatment Plan Summary: Medication management and Plan 80 year old woman who has a history of a recurrent delusion that her body is "twisted" or off balance.  As delusions ago this 1 is pretty benign.  Does not lead to any severe distress or severe loss of function.  Patient was alert and oriented.  Attentive and chatty.  Certainly not encephalopathic or delirious.  Does not seem to be depressed.  No sign at all of any dangerousness to herself.  I did not do formal cognitive testing but I had a bit of a sense she may be having a little memory problem.  She told me the same story a couple of times within a short bit of conversation.  I have made  sure that she is getting her Cymbalta and her 1 mg Risperdal.  I recognize that her QT interval was extended on her recent EKG but she has been on these medicines for a while and the doses are low enough that I think the risk is minimal.  My recommendation would be that the patient be discharged back to her assisted living facility as soon as possible and less you find some other specific pathology to treat.  Disposition: No evidence of imminent risk to self or others at present.   Patient does not meet criteria for psychiatric inpatient admission. Supportive therapy provided about ongoing stressors. Discussed crisis plan, support from social network, calling 911, coming to the Emergency Department, and calling Suicide Hotline.  Alethia Berthold, MD 01/27/2021 8:17 PM

## 2021-01-27 NOTE — ED Notes (Signed)
Assisted pt to get up to use bathroom. Pt was steady on feet and able to walk to toilet in room. At this time pt appears more alert and able to focus. Pt is still speaking about feeling like her body is "all twisted up and wrong" but is no longer restless and frantic.

## 2021-01-27 NOTE — Progress Notes (Signed)
Pt admitted to 1A136. Restless but compliant.   BP (!) 155/73   Pulse 88   Temp (!) 97.5 F (36.4 C) (Oral)   Resp 18   Ht 5\' 6"  (1.676 m)   Wt 84 kg   SpO2 100%   BMI 29.89 kg/m

## 2021-01-27 NOTE — ED Notes (Signed)
Howerter, DO in room speaking with pt at this time.

## 2021-01-27 NOTE — ED Notes (Signed)
Pt taken to ct 

## 2021-01-28 DIAGNOSIS — F22 Delusional disorders: Secondary | ICD-10-CM | POA: Diagnosis not present

## 2021-01-28 DIAGNOSIS — G9340 Encephalopathy, unspecified: Secondary | ICD-10-CM | POA: Diagnosis not present

## 2021-01-28 LAB — BASIC METABOLIC PANEL
Anion gap: 10 (ref 5–15)
BUN: 18 mg/dL (ref 8–23)
CO2: 27 mmol/L (ref 22–32)
Calcium: 9.3 mg/dL (ref 8.9–10.3)
Chloride: 104 mmol/L (ref 98–111)
Creatinine, Ser: 0.77 mg/dL (ref 0.44–1.00)
GFR, Estimated: >60 mL/min (ref >60)
Glucose, Bld: 140 mg/dL — ABNORMAL HIGH (ref 70–99)
Potassium: 3.9 mmol/L (ref 3.5–5.1)
Sodium: 141 mmol/L (ref 135–145)

## 2021-01-28 LAB — CBC
HCT: 41.9 % (ref 36.0–46.0)
Hemoglobin: 13.8 g/dL (ref 12.0–15.0)
MCH: 30.3 pg (ref 26.0–34.0)
MCHC: 32.9 g/dL (ref 30.0–36.0)
MCV: 92.1 fL (ref 80.0–100.0)
Platelets: 239 10*3/uL (ref 150–400)
RBC: 4.55 MIL/uL (ref 3.87–5.11)
RDW: 13.6 % (ref 11.5–15.5)
WBC: 8 10*3/uL (ref 4.0–10.5)
nRBC: 0 % (ref 0.0–0.2)

## 2021-01-28 LAB — TSH: TSH: 6.25 u[IU]/mL — ABNORMAL HIGH (ref 0.350–4.500)

## 2021-01-28 LAB — PROTIME-INR
INR: 1.1 (ref 0.8–1.2)
Prothrombin Time: 13.4 seconds (ref 11.4–15.2)

## 2021-01-28 LAB — GLUCOSE, CAPILLARY: Glucose-Capillary: 144 mg/dL — ABNORMAL HIGH (ref 70–99)

## 2021-01-28 LAB — HEMOGLOBIN A1C
Hgb A1c MFr Bld: 6.5 % — ABNORMAL HIGH (ref 4.8–5.6)
Mean Plasma Glucose: 139.85 mg/dL

## 2021-01-28 LAB — SARS CORONAVIRUS 2 (TAT 6-24 HRS): SARS Coronavirus 2: NEGATIVE

## 2021-01-28 LAB — AMMONIA: Ammonia: 19 umol/L (ref 9–35)

## 2021-01-28 LAB — MAGNESIUM: Magnesium: 2.2 mg/dL (ref 1.7–2.4)

## 2021-01-28 NOTE — Progress Notes (Signed)
Patient discharged at this time via ambulatory route with all belongings to home. PIVs/Tele removed by Probation officer. Patient verbalized understanding of discharge instructions per AVS. NAD noted upon departure.

## 2021-01-28 NOTE — Discharge Summary (Signed)
Physician Discharge Summary  [name] SWN:[id number] DOB: 1941/03/11 DOA: 01/27/2021  PCP: Leone Haven, MD  Admit date: 01/27/2021 Discharge date: 01/28/2021  Admitted From: ALF  Disposition:  ALF   Recommendations for Outpatient Follow-up:  1. Follow up with PCP Dr. Caryl Bis in 1 week 2. Follow up with [facility] as directed      Home Health: None  Equipment/Devices: None new  Discharge Condition: Good  CODE STATUS: FULL Diet recommendation: Regular  Brief/Interim Summary: [name] is a 80 year old F with DM, depression and recurrent sensation of feeling "twisted," or "out of alignment" who presented with recurrence of this situation.  At first in the ER, perhaps because of her difficulty in explaining the nature of this symptom, she was thought to be confused or disoriented and so the hospitalist group were asked to evaluate for delirium.  Later, she was evaluated by Psychiatry, Dr. Weber Cooks, who is familiar with her from preivous admissions, who was able to tell that her mentation was at baseline and that her "twisting" sensation was similar to previous episodes.  He recommended better adherence to her Risperdal and outpatient follow up.        PRINCIPAL HOSPITAL DIAGNOSIS: Twisting sensation    Discharge Diagnoses:    Twisting sensation Encephalopathy ruled out The patient reported to others that this is like a "twisting", or "out of alignment" sensation, that is very troubling to her.  She had first noticed it returned the day before admission, denied admission, it was worse when she woke up, and disturbing throughout the day so she came to the ER.  She had no falls, no dizziness.  She had no recent changes to her medicine, new emotional stressors, and no head trauma.  CT head unremarkable.  She has been taking her Cymbalta regularly but not her Risperdal, and so she was encouraged to take Risperdal with more regularity.  The patient was able to  ambulate around the unit comfortably without symptoms, or difficulty.   Cystitis ruled out To me the patient clearly denies any dysuria, urinary irritative symptoms, foul-smelling urine, or fever.  Although she was given 1 dose of Rocephin empirically for UTI, I do not believe she has bladder infection and will not continue this.  Dehydration The patient appeared dehydrated to the admitting physician, she was given fluids overnight.  This morning she does not appear dehydrated.  Diabetes A1c 6.5%, well controlled.  No change to home regimen.          Discharge Instructions  Discharge Instructions    Discharge instructions   Complete by: As directed    From Dr. Loleta Books: You were evaluated for your twisting sensation of being out of alignment.  Here, we found that your blood work was reassuring, we detected no symptoms or signs of worrisome illness, and no signs that your body was dangerously out of alignment.  I am sorry that you experience these ysmptoms, but given how they have improved in the past, I am fully confident they will do so again.  Go see Dr. Caryl Bis in 1 week for a routine hospital follow up  Resume your previous medicines as we discussed and Dr. Weber Cooks recommended.   Increase activity slowly   Complete by: As directed      Allergies as of 01/28/2021   No Known Allergies     Medication List    TAKE these medications   atorvastatin 40 MG tablet Commonly known as: LIPITOR TAKE 1 TABLET(40 MG) BY MOUTH DAILY  What changed: See the new instructions.   cholecalciferol 1000 units tablet Commonly known as: VITAMIN D Take 1,000 Units by mouth daily.   DULoxetine 30 MG capsule Commonly known as: CYMBALTA Take 1 capsule (30 mg total) by mouth daily.   metFORMIN 500 MG tablet Commonly known as: GLUCOPHAGE TAKE 1 TABLET(500 MG) BY MOUTH DAILY WITH BREAKFAST What changed: See the new instructions.   multivitamin tablet Take 1 tablet by mouth daily.    risperiDONE 1 MG tablet Commonly known as: RISPERDAL Take 1 mg by mouth at bedtime. As needed       Follow-up Information    Leone Haven, MD. Schedule an appointment as soon as possible for a visit in 1 week(s).   Specialty: Family Medicine Contact information: [place] [address] [phone]              No Known Allergies  Consultations:  Psychiatry   Procedures/Studies: DG Chest 2 View  Result Date: 01/27/2021 CLINICAL DATA:  Chest pain. EXAM: CHEST - 2 VIEW COMPARISON:  July 31, 2015. FINDINGS: The heart size and mediastinal contours are within normal limits. Both lungs are clear. No pneumothorax or pleural effusion is noted. The visualized skeletal structures are unremarkable. IMPRESSION: No active cardiopulmonary disease. Electronically Signed   By: Marijo Conception M.D.   On: 01/27/2021 14:25   CT Head Wo Contrast  Result Date: 01/27/2021 CLINICAL DATA:  Mental status change, unknown cause. EXAM: CT HEAD WITHOUT CONTRAST TECHNIQUE: Contiguous axial images were obtained from the base of the skull through the vertex without intravenous contrast. COMPARISON:  Brain MRI 03/22/2019. FINDINGS: Brain: Mild cerebral atrophy. Mild ill-defined hypoattenuation within the cerebral white matter is nonspecific, but compatible with chronic small vessel ischemic disease. There is no acute intracranial hemorrhage. No demarcated cortical infarct. No extra-axial fluid collection. No evidence of intracranial mass. No midline shift. Vascular: No hyperdense vessel.  Atherosclerotic calcifications. Skull: Normal. Negative for fracture or focal suspicious osseous lesion. Sinuses/Orbits: Visualized orbits show no acute finding. No significant paranasal sinus disease at the imaged levels. IMPRESSION: No evidence of acute intracranial abnormality. Mild cerebral atrophy and chronic small vessel ischemic disease. Electronically Signed   By: Kellie Simmering DO   On:  01/27/2021 12:20       Subjective: Patient feels well.  Twisting seems to be less bothersome during this morning.  No fever, abdominal pain, flank pain, dysuria, urinary frequency, urinary urgency, foul-smelling urine, dyspnea, or cough.  Discharge Exam: Vitals:   01/28/21 0514 01/28/21 0735  BP: (!) 162/75 (!) 157/81  Pulse: 72 78  Resp: 17 17  Temp: 98 F (36.7 C) 98.3 F (36.8 C)  SpO2: 96% 99%   Vitals:   01/27/21 2023 01/28/21 0025 01/28/21 0514 01/28/21 0735  BP: (!) 147/73 (!) 118/58 (!) 162/75 (!) 157/81  Pulse: 77 77 72 78  Resp: 16 17 17 17   Temp: 98.1 F (36.7 C) 97.7 F (36.5 C) 98 F (36.7 C) 98.3 F (36.8 C)  TempSrc:      SpO2: 97% 96% 96% 99%  Weight:   84 kg   Height:        General: Pt is alert, awake, not in acute distress Cardiovascular: RRR, nl S1-S2, no murmurs appreciated.   No LE edema.   Respiratory: Normal respiratory rate and rhythm.  CTAB without rales or wheezes. Abdominal: Abdomen soft and non-tender.  No distension or HSM.   Neuro/Psych: Strength symmetric in upper and lower  extremities.  Judgment and insight appear normal.  Gait normal.   The results of significant diagnostics from this hospitalization (including imaging, microbiology, ancillary and laboratory) are listed below for reference.     Microbiology: Recent Results (from the past 240 hour(s))  SARS CORONAVIRUS 2 (TAT 6-24 HRS) Nasopharyngeal Nasopharyngeal Swab     Status: None   Collection Time: 01/27/21 12:16 PM   Specimen: Nasopharyngeal Swab  Result Value Ref Range Status   SARS Coronavirus 2 NEGATIVE NEGATIVE Final    Comment: (NOTE) SARS-CoV-2 target nucleic acids are NOT DETECTED.  The SARS-CoV-2 RNA is generally detectable in upper and lower respiratory specimens during the acute phase of infection. Negative results do not preclude SARS-CoV-2 infection, do not rule out co-infections with other pathogens, and should not be used as the sole basis for treatment  or other patient management decisions. Negative results must be combined with clinical observations, patient history, and epidemiological information. The expected result is Negative.  Fact Sheet for Patients: SugarRoll.be  Fact Sheet for Healthcare Providers: https://www.woods-mathews.com/  This test is not yet approved or cleared by the [place] FDA and  has been authorized for detection and/or diagnosis of SARS-CoV-2 by FDA under an Emergency Use Authorization (EUA). This EUA will remain  in effect (meaning this test can be used) for the duration of the COVID-19 declaration under Se ction 564(b)(1) of the Act, 21 U.S.C. section 360bbb-3(b)(1), unless the authorization is terminated or revoked sooner.  Performed at [facility], [place] [address], [place], [place] [address]   Culture, blood (Routine X 2) w Reflex to ID Panel     Status: None (Preliminary result)   Collection Time: 01/27/21  6:48 PM   Specimen: BLOOD  Result Value Ref Range Status   Specimen Description BLOOD RIGHT ANTECUBITAL  Final   Special Requests   Final    BOTTLES DRAWN AEROBIC AND ANAEROBIC Blood Culture adequate volume   Culture   Final    NO GROWTH < 12 HOURS Performed at [facility], [address], [place], [place] [address]    Report Status PENDING  Incomplete  Culture, blood (Routine X 2) w Reflex to ID Panel     Status: None (Preliminary result)   Collection Time: 01/27/21  7:10 PM   Specimen: BLOOD  Result Value Ref Range Status   Specimen Description BLOOD BLOOD LEFT HAND  Final   Special Requests   Final    BOTTLES DRAWN AEROBIC AND ANAEROBIC Blood Culture adequate volume   Culture   Final    NO GROWTH < 12 HOURS Performed at [facility], [address] [place], [place] [address]    Report Status PENDING  Incomplete     Labs: BNP (last 3 results) No results for input(s): BNP in the last 8760 hours. Basic  Metabolic Panel: Recent Labs  Lab 01/27/21 1147 01/27/21 1353 01/28/21 0559  NA 137  --  141  K 4.3  --  3.9  CL 103  --  104  CO2 24  --  27  GLUCOSE 154*  --  140*  BUN 23  --  18  CREATININE 0.78  --  0.77  CALCIUM 9.0  --  9.3  MG  --  2.1 2.2   Liver Function Tests: Recent Labs  Lab 01/27/21 1147  AST 21  ALT 19  ALKPHOS 75  BILITOT 1.1  PROT 7.3  ALBUMIN 4.3   Recent Labs  Lab 01/27/21 1147  LIPASE  34   Recent Labs  Lab 01/28/21 0559  AMMONIA 19   CBC: Recent Labs  Lab 01/27/21 1147 01/28/21 0559  WBC 6.4 8.0  NEUTROABS 4.4  --   HGB 13.4 13.8  HCT 41.2 41.9  MCV 92.2 92.1  PLT 231 239   Cardiac Enzymes: Recent Labs  Lab 01/27/21 1850  CKTOTAL 89   BNP: Invalid input(s): POCBNP CBG: Recent Labs  Lab 01/27/21 2120 01/28/21 0737  GLUCAP 93 144*   D-Dimer No results for input(s): DDIMER in the last 72 hours. Hgb A1c No results for input(s): HGBA1C in the last 72 hours. Lipid Profile No results for input(s): CHOL, HDL, LDLCALC, TRIG, CHOLHDL, LDLDIRECT in the last 72 hours. Thyroid function studies Recent Labs    01/28/21 0559  TSH 6.250*   Anemia work up No results for input(s): VITAMINB12, FOLATE, FERRITIN, TIBC, IRON, RETICCTPCT in the last 72 hours. Urinalysis    Component Value Date/Time   COLORURINE YELLOW (A) 01/27/2021 1216   APPEARANCEUR CLEAR (A) 01/27/2021 1216   APPEARANCEUR Clear 01/03/2015 1747   LABSPEC 1.011 01/27/2021 1216   LABSPEC 1.008 01/03/2015 1747   PHURINE 6.0 01/27/2021 1216   GLUCOSEU NEGATIVE 01/27/2021 1216   GLUCOSEU Negative 01/03/2015 1747   HGBUR NEGATIVE 01/27/2021 1216   BILIRUBINUR NEGATIVE 01/27/2021 1216   BILIRUBINUR Negative 01/03/2015 1747   KETONESUR 20 (A) 01/27/2021 1216   PROTEINUR NEGATIVE 01/27/2021 1216   NITRITE POSITIVE (A) 01/27/2021 1216   LEUKOCYTESUR NEGATIVE 01/27/2021 1216   LEUKOCYTESUR 2+ 01/03/2015 1747   Sepsis Labs Invalid input(s): PROCALCITONIN,  WBC,   LACTICIDVEN Microbiology Recent Results (from the past 240 hour(s))  SARS CORONAVIRUS 2 (TAT 6-24 HRS) Nasopharyngeal Nasopharyngeal Swab     Status: None   Collection Time: 01/27/21 12:16 PM   Specimen: Nasopharyngeal Swab  Result Value Ref Range Status   SARS Coronavirus 2 NEGATIVE NEGATIVE Final    Comment: (NOTE) SARS-CoV-2 target nucleic acids are NOT DETECTED.  The SARS-CoV-2 RNA is generally detectable in upper and lower respiratory specimens during the acute phase of infection. Negative results do not preclude SARS-CoV-2 infection, do not rule out co-infections with other pathogens, and should not be used as the sole basis for treatment or other patient management decisions. Negative results must be combined with clinical observations, patient history, and epidemiological information. The expected result is Negative.  Fact Sheet for Patients: SugarRoll.be  Fact Sheet for Healthcare Providers: https://www.woods-mathews.com/  This test is not yet approved or cleared by the [place] FDA and  has been authorized for detection and/or diagnosis of SARS-CoV-2 by FDA under an Emergency Use Authorization (EUA). This EUA will remain  in effect (meaning this test can be used) for the duration of the COVID-19 declaration under Se ction 564(b)(1) of the Act, 21 U.S.C. section 360bbb-3(b)(1), unless the authorization is terminated or revoked sooner.  Performed at [facility], [place] [address] [place], [place] [address]   Culture, blood (Routine X 2) w Reflex to ID Panel     Status: None (Preliminary result)   Collection Time: 01/27/21  6:48 PM   Specimen: BLOOD  Result Value Ref Range Status   Specimen Description BLOOD RIGHT ANTECUBITAL  Final   Special Requests   Final    BOTTLES DRAWN AEROBIC AND ANAEROBIC Blood Culture adequate volume   Culture   Final    NO GROWTH < 12 HOURS Performed at [facility], [address], [place], [place] [address]  Report Status PENDING  Incomplete  Culture, blood (Routine X 2) w Reflex to ID Panel     Status: None (Preliminary result)   Collection Time: 01/27/21  7:10 PM   Specimen: BLOOD  Result Value Ref Range Status   Specimen Description BLOOD BLOOD LEFT HAND  Final   Special Requests   Final    BOTTLES DRAWN AEROBIC AND ANAEROBIC Blood Culture adequate volume   Culture   Final    NO GROWTH < 12 HOURS Performed at [facility], [address] [place], [place] [address]    Report Status PENDING  Incomplete     Time coordinating discharge: 25 minutes      SIGNED:   Edwin Dada, MD  [facility] 01/28/2021, 11:04 AM

## 2021-01-29 LAB — URINE CULTURE: Culture: >=100000 — AB

## 2021-01-30 ENCOUNTER — Telehealth: Payer: Self-pay

## 2021-01-30 NOTE — Telephone Encounter (Signed)
Transition Care Management Unsuccessful Follow-up Telephone Call  Date of discharge and from where:  01/28/21 from [facility]  Attempts:  1st Attempt  Reason for unsuccessful TCM follow-up call:  No answer/busy. Will follow.

## 2021-01-31 NOTE — Telephone Encounter (Signed)
Patient declines hfu at this time. Agrees to keep previously scheduled 6 month appointment 02/22/21. Nurse encourages patient to reach out if symptoms present, worsen and as needed. No transitional care appointment scheduled at this time.

## 2021-02-01 DIAGNOSIS — F060 Psychotic disorder with hallucinations due to known physiological condition: Secondary | ICD-10-CM | POA: Diagnosis not present

## 2021-02-01 LAB — CULTURE, BLOOD (ROUTINE X 2)
Culture: NO GROWTH
Culture: NO GROWTH
Special Requests: ADEQUATE
Special Requests: ADEQUATE

## 2021-02-08 LAB — BLOOD GAS, VENOUS
Acid-Base Excess: 2.8 mmol/L — ABNORMAL HIGH (ref 0.0–2.0)
Bicarbonate: 29.4 mmol/L — ABNORMAL HIGH (ref 20.0–28.0)
O2 Saturation: 62.6 %
Patient temperature: 37
pCO2, Ven: 52 mmHg (ref 44.0–60.0)
pH, Ven: 7.36 (ref 7.250–7.430)
pO2, Ven: 34 mmHg (ref 32.0–45.0)

## 2021-02-20 ENCOUNTER — Other Ambulatory Visit: Payer: Self-pay

## 2021-02-22 ENCOUNTER — Encounter: Payer: Self-pay | Admitting: Family Medicine

## 2021-02-22 ENCOUNTER — Telehealth (INDEPENDENT_AMBULATORY_CARE_PROVIDER_SITE_OTHER): Payer: Medicare Other | Admitting: Family Medicine

## 2021-02-22 ENCOUNTER — Other Ambulatory Visit: Payer: Self-pay

## 2021-02-22 DIAGNOSIS — E119 Type 2 diabetes mellitus without complications: Secondary | ICD-10-CM | POA: Diagnosis not present

## 2021-02-22 DIAGNOSIS — R7989 Other specified abnormal findings of blood chemistry: Secondary | ICD-10-CM | POA: Diagnosis not present

## 2021-02-22 DIAGNOSIS — Z8659 Personal history of other mental and behavioral disorders: Secondary | ICD-10-CM | POA: Diagnosis not present

## 2021-02-22 DIAGNOSIS — E785 Hyperlipidemia, unspecified: Secondary | ICD-10-CM | POA: Diagnosis not present

## 2021-02-22 NOTE — Assessment & Plan Note (Signed)
Well-controlled.  She will continue Metformin 500 mg daily.  She will try to decrease her dessert intake.  She will try to increase her activity level.

## 2021-02-22 NOTE — Progress Notes (Signed)
Virtual Visit via telephone Note  This visit type was conducted due to national recommendations for restrictions regarding the COVID-19 pandemic (e.g. social distancing).  This format is felt to be most appropriate for this patient at this time.  All issues noted in this document were discussed and addressed.  No physical exam was performed (except for noted visual exam findings with Video Visits).   I connected with Pamela Norman today at  1:45 PM EDT by telephone and verified that I am speaking with the correct person using two identifiers. Location patient: home Location provider: home office Persons participating in the virtual visit: patient, provider  I discussed the limitations, risks, security and privacy concerns of performing an evaluation and management service by telephone and the availability of in person appointments. I also discussed with the patient that there may be a patient responsible charge related to this service. The patient expressed understanding and agreed to proceed.  Interactive audio and video telecommunications were attempted between this provider and patient, however failed, due to patient having technical difficulties OR patient did not have access to video capability.  We continued and completed visit with audio only.   Reason for visit: f/u  HPI: Diabetes: Last A1c was 6.5 less than a month ago.  She is taking Metformin.  No polyuria or polydipsia.  No hypoglycemia.  She did see ophthalmology recently.  She is having a hard time resisting lunchtime desserts.  She has put on a few pounds.  Hyperlipidemia: Taking Lipitor.  No chest pain, claudication, right upper quadrant pain, or myalgias.  History of psychosis: Patient recently hospitalized describing a sensation of feeling twisted.  Psychiatry examined her and noted she was at her baseline mental status and has had similar sensations previously.  She notes the sensations have resolved.  She had not been  taking her Risperdal consistently though she is currently taking this on a daily basis with Cymbalta.  She follows up with psychiatry every 3 months for medication refills.  Elevated TSH: Noted on recent labs.  No significant weight gain though has gained a few pounds due to dietary indiscretions.  She is not interested in checking additional lab work at this time.   ROS: See pertinent positives and negatives per HPI.  Past Medical History:  Diagnosis Date  . Cataract   . Cervical stenosis of spine   . Endometrial cancer ([place]) 2016   Grade 1,total laparoscopic hysterectomy  . History of colonoscopy 2014   within normal limits  . History of ovarian cyst 01/2015  . Hyperlipidemia   . Rectal prolapse   . Squamous cell carcinoma    face    Past Surgical History:  Procedure Laterality Date  . ABDOMINAL HYSTERECTOMY    . APPENDECTOMY    . BILATERAL SALPINGOOPHORECTOMY  03/30/15  . CATARACT EXTRACTION W/PHACO Right 02/06/2016   Procedure: CATARACT EXTRACTION PHACO AND INTRAOCULAR LENS PLACEMENT (IOC);  Surgeon: Estill Cotta, MD;  Location: [facility] ORS;  Service: Ophthalmology;  Laterality: Right;  [place] 01:39 AP% 24.9 CDE 47.32 fluid pack lot # [id number]  . CATARACT EXTRACTION W/PHACO Left 03/05/2016   Procedure: CATARACT EXTRACTION PHACO AND INTRAOCULAR LENS PLACEMENT ([place]);  Surgeon: Estill Cotta, MD;  Location: [facility] ORS;  Service: Ophthalmology;  Laterality: Left;  [place] 01:02 AP% 23.2 CDE 26.73 fluid pack lot # [id number]  . COMBINED HYSTEROSCOPY DIAGNOSTIC / D&C  2016  . DILATION AND CURETTAGE OF UTERUS    . dnc    . LAPAROSCOPIC HYSTERECTOMY  03/30/15  .  NASAL SINUS SURGERY    . SKIN CANCER EXCISION     UPPER LIP  . TONSILECTOMY, ADENOIDECTOMY, BILATERAL MYRINGOTOMY AND TUBES    . TONSILLECTOMY      Family History  Problem Relation Age of Onset  . Colon cancer Father        older age onset  . COPD Father   . COPD Mother   . Breast cancer Neg Hx   . Ovarian cancer Neg Hx    . Diabetes Neg Hx   . Heart disease Neg Hx     SOCIAL HX: Former smoker   Current Outpatient Medications:  .  atorvastatin (LIPITOR) 40 MG tablet, TAKE 1 TABLET(40 MG) BY MOUTH DAILY (Patient taking differently: Take 40 mg by mouth daily.), Disp: 90 tablet, Rfl: 1 .  cholecalciferol (VITAMIN D) 1000 units tablet, Take 1,000 Units by mouth daily., Disp: , Rfl:  .  DULoxetine (CYMBALTA) 30 MG capsule, Take 1 capsule (30 mg total) by mouth daily., Disp: 30 capsule, Rfl: 2 .  metFORMIN (GLUCOPHAGE) 500 MG tablet, TAKE 1 TABLET(500 MG) BY MOUTH DAILY WITH BREAKFAST (Patient taking differently: Take 500 mg by mouth daily with breakfast.), Disp: 90 tablet, Rfl: 0 .  Multiple Vitamin (MULTIVITAMIN) tablet, Take 1 tablet by mouth daily., Disp: , Rfl:  .  risperiDONE (RISPERDAL) 1 MG tablet, Take 1 mg by mouth at bedtime. As needed, Disp: , Rfl:   EXAM: This was a telephone visit and thus no physical exam was completed.  ASSESSMENT AND PLAN:  Discussed the following assessment and plan:  Problem List Items Addressed This Visit    Diabetes mellitus without complication ([place])    Well-controlled.  She will continue Metformin 500 mg daily.  She will try to decrease her dessert intake.  She will try to increase her activity level.      Elevated TSH    Possibly related to a thyroid issue though could have been related to the stress of what was going on at the time.  We will plan to recheck her thyroid function at her follow-up in 3 months.      History of psychosis    She will continue to see psychiatry.  I encouraged her to take her medications as prescribed.      Hyperlipidemia    She will continue Lipitor.  She declines labs at this time.  We will plan for labs in 3 months.          I discussed the assessment and treatment plan with the patient. The patient was provided an opportunity to ask questions and all were answered. The patient agreed with the plan and demonstrated an  understanding of the instructions.   The patient was advised to call back or seek an in-person evaluation if the symptoms worsen or if the condition fails to improve as anticipated.  I provided 13 minutes of non-face-to-face time during this encounter.   Tommi Rumps, MD

## 2021-02-22 NOTE — Assessment & Plan Note (Signed)
Possibly related to a thyroid issue though could have been related to the stress of what was going on at the time.  We will plan to recheck her thyroid function at her follow-up in 3 months.

## 2021-02-22 NOTE — Assessment & Plan Note (Signed)
She will continue to see psychiatry.  I encouraged her to take her medications as prescribed.

## 2021-02-22 NOTE — Assessment & Plan Note (Signed)
She will continue Lipitor.  She declines labs at this time.  We will plan for labs in 3 months.

## 2021-03-24 DIAGNOSIS — Z1231 Encounter for screening mammogram for malignant neoplasm of breast: Secondary | ICD-10-CM | POA: Diagnosis not present

## 2021-03-24 LAB — HM MAMMOGRAPHY

## 2021-04-04 ENCOUNTER — Telehealth: Payer: Self-pay | Admitting: Family Medicine

## 2021-04-04 NOTE — Telephone Encounter (Signed)
Please let the patient know that her mammogram revealed an asymmetry in her right breast that needs to be evaluated with additional imaging.  [facility] should be contacting her to get this scheduled and if they do not contact her within the next week she should let us know.  Thanks.

## 2021-04-05 NOTE — Telephone Encounter (Signed)
I called and spoke with the patient and she was notified by [facility] about her mammogram by letter and she will call them today to schedule.  Ronn Smolinsky,cma

## 2021-04-16 ENCOUNTER — Other Ambulatory Visit: Payer: Self-pay | Admitting: Family Medicine

## 2021-04-21 DIAGNOSIS — R928 Other abnormal and inconclusive findings on diagnostic imaging of breast: Secondary | ICD-10-CM | POA: Diagnosis not present

## 2021-04-21 LAB — HM MAMMOGRAPHY

## 2021-05-03 DIAGNOSIS — F060 Psychotic disorder with hallucinations due to known physiological condition: Secondary | ICD-10-CM | POA: Diagnosis not present

## 2021-06-12 ENCOUNTER — Other Ambulatory Visit: Payer: Self-pay | Admitting: Family Medicine

## 2021-06-27 ENCOUNTER — Encounter: Payer: TRICARE For Life (TFL) | Admitting: Obstetrics and Gynecology

## 2021-07-26 DIAGNOSIS — E119 Type 2 diabetes mellitus without complications: Secondary | ICD-10-CM | POA: Diagnosis not present

## 2021-08-02 DIAGNOSIS — F060 Psychotic disorder with hallucinations due to known physiological condition: Secondary | ICD-10-CM | POA: Diagnosis not present

## 2021-09-13 DIAGNOSIS — Z23 Encounter for immunization: Secondary | ICD-10-CM | POA: Diagnosis not present

## 2021-11-04 ENCOUNTER — Other Ambulatory Visit: Payer: Self-pay | Admitting: Family Medicine

## 2021-11-10 ENCOUNTER — Ambulatory Visit: Payer: Medicare Other

## 2021-11-21 DIAGNOSIS — F209 Schizophrenia, unspecified: Secondary | ICD-10-CM | POA: Diagnosis not present

## 2021-11-23 DIAGNOSIS — F060 Psychotic disorder with hallucinations due to known physiological condition: Secondary | ICD-10-CM | POA: Diagnosis not present

## 2021-12-29 ENCOUNTER — Other Ambulatory Visit: Payer: Self-pay

## 2021-12-29 ENCOUNTER — Telehealth: Payer: Self-pay | Admitting: Family Medicine

## 2021-12-29 DIAGNOSIS — E119 Type 2 diabetes mellitus without complications: Secondary | ICD-10-CM

## 2021-12-29 MED ORDER — METFORMIN HCL 500 MG PO TABS: ORAL_TABLET | ORAL | 1 refills | Status: DC

## 2021-12-29 NOTE — Telephone Encounter (Signed)
I called and spoke with the patient and scheduled her for a follow up in order to send her medication, appointment is set and medication sent.  Cashton Hosley,cma

## 2021-12-29 NOTE — Telephone Encounter (Signed)
Pt is needing refill for sent to [facility] in [place].   metFORMIN (GLUCOPHAGE) 500 MG tablet

## 2022-01-03 ENCOUNTER — Other Ambulatory Visit: Payer: Self-pay | Admitting: Family Medicine

## 2022-01-03 DIAGNOSIS — E119 Type 2 diabetes mellitus without complications: Secondary | ICD-10-CM

## 2022-01-04 ENCOUNTER — Other Ambulatory Visit: Payer: Self-pay

## 2022-01-04 DIAGNOSIS — E119 Type 2 diabetes mellitus without complications: Secondary | ICD-10-CM

## 2022-01-04 MED ORDER — METFORMIN HCL 500 MG PO TABS: ORAL_TABLET | ORAL | 1 refills | Status: DC

## 2022-01-04 NOTE — Telephone Encounter (Signed)
Pt called in stating that she had put in a refill request for medication (metFORMIN (GLUCOPHAGE) 500 MG tablet). Pt stated that medication was denied. Pt would like to know what's going on. Pt requesting callback.

## 2022-01-05 ENCOUNTER — Other Ambulatory Visit: Payer: Self-pay

## 2022-01-05 ENCOUNTER — Other Ambulatory Visit: Payer: Self-pay | Admitting: Family Medicine

## 2022-01-05 DIAGNOSIS — E119 Type 2 diabetes mellitus without complications: Secondary | ICD-10-CM

## 2022-01-05 MED ORDER — METFORMIN HCL 500 MG PO TABS: ORAL_TABLET | ORAL | 1 refills | Status: DC

## 2022-01-05 NOTE — Telephone Encounter (Signed)
I called and LVM informing the patient that her medication was resent to the pharmacy.  Brynden Thune,cma

## 2022-01-26 NOTE — Progress Notes (Unsigned)
° ° °  GYNECOLOGY PROGRESS NOTE  Subjective:    Patient ID: Adolphus Birchwood, female    DOB: [birth year], 81 y.o.   MRN: [id number]  HPI  Patient is a 81 y.o. G0P0000 female who presents for surveillance of endometrial cancer.  She was diagnosed with Stage IA grade 1 endometrioid adenocarcinoma in 2016.  She  Underwent TLH, BSO, pelvic lymph node mapping.  She also completed 6 treatments of vaginal brachytherapy since final path showed LVSI (completed) 09/20/2015). She has been followed up by the GYN Oncology at [facility] for the past 5 years. Alternating with GYN.  Last GYN visit was one year ago with me. She denies any complaints today. Last visit to Oncology was in January 25, 2021.  GYN History:  Last mammogram was 04/21/2021 reported as Normal. Repeat: 1 year Last bone density was 08/19/2018 consistent with osteopenia. Arranged through PCP. Due 08/2020.  Last colonoscopy in 07/16/2013 with Dr. Gustavo Lah. Recommended repeating in 2019.   {Common ambulatory SmartLinks:19316}  Review of Systems {ros; complete:30496}   Objective:   There were no vitals taken for this visit. There is no height or weight on file to calculate BMI. General appearance: {general exam:16600} Abdomen: {abdominal exam:16834} Pelvic: {pelvic exam:16852::"cervix normal in appearance","external genitalia normal","no adnexal masses or tenderness","no cervical motion tenderness","rectovaginal septum normal","uterus normal size, shape, and consistency","vagina normal without discharge"} Extremities: {extremity exam:5109} Neurologic: {neuro exam:17854}   Assessment:   No diagnosis found.   Plan:   There are no diagnoses linked to this encounter.    Rubie Maid, MD [facility]

## 2022-01-31 ENCOUNTER — Encounter: Payer: TRICARE For Life (TFL) | Admitting: Obstetrics and Gynecology

## 2022-02-02 ENCOUNTER — Encounter: Payer: TRICARE For Life (TFL) | Admitting: Obstetrics and Gynecology

## 2022-02-23 ENCOUNTER — Encounter: Payer: TRICARE For Life (TFL) | Admitting: Obstetrics and Gynecology

## 2022-04-11 ENCOUNTER — Ambulatory Visit (INDEPENDENT_AMBULATORY_CARE_PROVIDER_SITE_OTHER): Payer: Medicare Other | Admitting: Obstetrics and Gynecology

## 2022-04-11 ENCOUNTER — Encounter: Payer: Self-pay | Admitting: Obstetrics and Gynecology

## 2022-04-11 VITALS — BP 163/90 | HR 89 | Resp 16 | Ht 66.5 in | Wt 188.1 lb

## 2022-04-11 DIAGNOSIS — Z8542 Personal history of malignant neoplasm of other parts of uterus: Secondary | ICD-10-CM

## 2022-04-11 DIAGNOSIS — Z1231 Encounter for screening mammogram for malignant neoplasm of breast: Secondary | ICD-10-CM

## 2022-04-11 NOTE — Progress Notes (Signed)
? ? ?GYNECOLOGY PROGRESS NOTE ? ?Subjective:  ? ? Patient ID: Pamela Norman, female    DOB: Apr 18, 1941, 81 y.o.   MRN: [id number] ? ?HPI ? Patient is a 81 y.o. [place] female who presents for surveillance of endometrial cancer.  She was diagnosed with Stage IA grade 1 endometrioid adenocarcinoma in 2016.  She  Underwent TLH, BSO, pelvic lymph node mapping.  She also completed 6 treatments of vaginal brachytherapy since final path showed LVSI (completed) 09/20/2015). She has been followed up by the GYN Oncology at [facility] for the past 5 years. Alternating with GYN.  Last GYN visit was in 2017 with Dr. Malachi Paradise. She denies any complaints today. Last visit to Oncology was in January 2021. ? ? ?GYN History:  ?Last mammogram was 04/21/2021 reported as bi-rads category 2. Repeat 08/2019.  ?Last bone density was 08/19/2018 consistent with osteopenia. Arranged through PCP. Due 08/2020.  ?Last colonoscopy in 2019 with Dr. Gustavo Lah. Advised that she did not require any further screening.   ? ? ?Past Medical History:  ?Diagnosis Date  ? Cataract   ? Cervical stenosis of spine   ? Endometrial cancer ([place]) 2016  ? Grade 1,total laparoscopic hysterectomy  ? History of colonoscopy 2014  ? within normal limits  ? History of ovarian cyst 01/2015  ? Hyperlipidemia   ? Rectal prolapse   ? Squamous cell carcinoma   ? face  ? ? ?Family History  ?Problem Relation Age of Onset  ? Colon cancer Father   ?     older age onset  ? COPD Father   ? COPD Mother   ? Breast cancer Neg Hx   ? Ovarian cancer Neg Hx   ? Diabetes Neg Hx   ? Heart disease Neg Hx   ? ? ?Past Surgical History:  ?Procedure Laterality Date  ? ABDOMINAL HYSTERECTOMY    ? APPENDECTOMY    ? BILATERAL SALPINGOOPHORECTOMY  03/30/15  ? CATARACT EXTRACTION W/PHACO Right 02/06/2016  ? Procedure: CATARACT EXTRACTION PHACO AND INTRAOCULAR LENS PLACEMENT (IOC);  Surgeon: Estill Cotta, MD;  Location: [facility] ORS;  Service: Ophthalmology;  Laterality: Right;  [place]  01:39 ?AP% 24.9 ?CDE 47.32 ?fluid pack lot # [id number]  ? CATARACT EXTRACTION W/PHACO Left 03/05/2016  ? Procedure: CATARACT EXTRACTION PHACO AND INTRAOCULAR LENS PLACEMENT (IOC);  Surgeon: Estill Cotta, MD;  Location: [facility] ORS;  Service: Ophthalmology;  Laterality: Left;  [place] 01:02 ?AP% 23.2 ?CDE 26.73 ?fluid pack lot # [id number]  ? COMBINED HYSTEROSCOPY DIAGNOSTIC / D&C  2016  ? DILATION AND CURETTAGE OF UTERUS    ? dnc    ? LAPAROSCOPIC HYSTERECTOMY  03/30/15  ? NASAL SINUS SURGERY    ? SKIN CANCER EXCISION    ? UPPER LIP  ? TONSILECTOMY, ADENOIDECTOMY, BILATERAL MYRINGOTOMY AND TUBES    ? TONSILLECTOMY    ? ? ?Social History  ? ?Socioeconomic History  ? Marital status: Single  ?  Spouse name: Not on file  ? Number of children: 0  ? Years of education: PhD  ? Highest education level: Not on file  ?Occupational History  ? Occupation: Retired  ?Tobacco Use  ? Smoking status: Former  ?  Packs/day: 0.25  ?  Years: 20.00  ?  Pack years: 5.00  ?  Types: Cigarettes  ?  Quit date: 04/13/1981  ?  Years since quitting: 41.0  ? Smokeless tobacco: Never  ?Vaping Use  ? Vaping Use: Never used  ?Substance and Sexual Activity  ? Alcohol  use: No  ? Drug use: No  ? Sexual activity: Not Currently  ?Other Topics Concern  ? Not on file  ?Social History Narrative  ? Lives in [place]. No children.  ?   ? Retired [employer]  ?   ? Diet - regular diet, herbalife  ?   ? Exercise - none regular, [employer]  ?   ? Right-handed.  ?   ? 1-2 cups caffeine daily.  ? ?Social Determinants of Health  ? ?Financial Resource Strain: Not on file  ?Food Insecurity: Not on file  ?Transportation Needs: Not on file  ?Physical Activity: Not on file  ?Stress: Not on file  ?Social Connections: Not on file  ?Intimate Partner Violence: Not on file  ? ? ?Current Outpatient Medications on File Prior to Visit  ?Medication Sig Dispense Refill  ? atorvastatin (LIPITOR) 40 MG tablet TAKE 1 TABLET(40 MG) BY MOUTH DAILY 90 tablet 1  ?  cholecalciferol (VITAMIN D) 1000 units tablet Take 1,000 Units by mouth daily.    ? DULoxetine (CYMBALTA) 30 MG capsule Take 1 capsule (30 mg total) by mouth daily. 30 capsule 2  ? metFORMIN (GLUCOPHAGE) 500 MG tablet TAKE ONE TABLET 500 MG BY MOUTH DAILY WITH BREAKFAST. 90 tablet 1  ? Multiple Vitamin (MULTIVITAMIN) tablet Take 1 tablet by mouth daily.    ? risperiDONE (RISPERDAL) 1 MG tablet Take 1 mg by mouth at bedtime. As needed    ? ?No current facility-administered medications on file prior to visit.  ? ? ?No Known Allergies ? ? ?Review of Systems ?Pertinent items noted in HPI and remainder of comprehensive ROS otherwise negative.  ? ?Objective:  ? Blood pressure (!) 163/90, pulse 89, resp. rate 16, height 5' 6.5" (1.689 m), weight 188 lb 1.6 oz (85.3 kg). ?General appearance: alert and no distress ?Abdomen: soft, non-tender; bowel sounds normal; no masses,  no organomegaly ?Pelvic: external genitalia normal, rectovaginal septum normal.  Vagina without discharge. Moderate atrophy present. Uterus and cervix surgically absent. Adnexae surgically absent.  ?Extremities: extremities normal, atraumatic, no cyanosis or edema ?Neurologic: Grossly normal  ?Lymph nodes: Cervical, supraclavicular, and axillary nodes normal. ? ? ?Assessment:  ? ?H/o endometrial cancer ?Breast cancer screening ? ?Plan:  ? ?- H/o endometrial cancer - doing well, no complaints. NED. Has completed Oncology f/u last year in January. Continues to do well. then can resume general GYN care yearly or every other year.  ?- Colon cancer screening, up to date. No further screening needed.  ?- Breat cancer screening, ordered.  May not require further screens after this year.  ? ? ?RTC in 1-2 years.  ? ? ?Rubie Maid, MD ?[facility] ? ?

## 2022-05-11 ENCOUNTER — Ambulatory Visit: Payer: Medicare Other | Admitting: Family Medicine

## 2022-05-22 ENCOUNTER — Ambulatory Visit: Payer: Medicare Other

## 2022-05-22 ENCOUNTER — Ambulatory Visit (INDEPENDENT_AMBULATORY_CARE_PROVIDER_SITE_OTHER): Payer: Medicare Other

## 2022-05-22 VITALS — Ht 66.5 in | Wt 188.0 lb

## 2022-05-22 DIAGNOSIS — Z0000 Encounter for general adult medical examination without abnormal findings: Secondary | ICD-10-CM | POA: Diagnosis not present

## 2022-05-22 NOTE — Patient Instructions (Addendum)
  Ms. Shor , Thank you for taking time to come for your Medicare Wellness Visit. I appreciate your ongoing commitment to your health goals. Please review the following plan we discussed and let me know if I can assist you in the future.   These are the goals we discussed:  Goals      Increase physical activity     Use recumbent bike.     Reduce Sugar Intake     Portion control        This is a list of the screening recommended for you and due dates:  Health Maintenance  Topic Date Due   Mammogram  04/21/2022   COVID-19 Vaccine (4 - Pfizer series) 06/07/2022*   Complete foot exam   06/11/2022*   Hemoglobin A1C  06/11/2022*   Urine Protein Check  06/11/2022*   Tetanus Vaccine  05/23/2023*   Flu Shot  07/10/2022   Eye exam for diabetics  02/17/2023   Pneumonia Vaccine  Completed   Zoster (Shingles) Vaccine  Completed   HPV Vaccine  Aged Out   DEXA scan (bone density measurement)  Discontinued  *Topic was postponed. The date shown is not the original due date.

## 2022-05-22 NOTE — Progress Notes (Signed)
Subjective:   Pamela Norman is a 81 y.o. female who presents for Medicare Annual (Subsequent) preventive examination.  Review of Systems    No ROS.  Medicare Wellness Virtual Visit.  Visual/audio telehealth visit, UTA vital signs.   See social history for additional risk factors.   Cardiac Risk Factors include: advanced age (>63mn, >>1women)     Objective:    Today's Vitals   05/22/22 1516  Weight: 188 lb (85.3 kg)  Height: 5' 6.5" (1.689 m)   Body mass index is 29.89 kg/m.     05/22/2022    3:29 PM 01/27/2021   11:22 AM 01/25/2021    3:48 PM 11/09/2020   12:46 PM 12/16/2019   10:51 AM 11/09/2019   12:51 PM 03/24/2019   12:08 PM  Advanced Directives  Does Patient Have a Medical Advance Directive? No No No No No No No  Would patient like information on creating a medical advance directive? No - Patient declined No - Patient declined No - Patient declined   No - Patient declined No - Patient declined    Current Medications (verified) Outpatient Encounter Medications as of 05/22/2022  Medication Sig   atorvastatin (LIPITOR) 40 MG tablet TAKE 1 TABLET(40 MG) BY MOUTH DAILY   cholecalciferol (VITAMIN D) 1000 units tablet Take 1,000 Units by mouth daily.   DULoxetine (CYMBALTA) 30 MG capsule Take 1 capsule (30 mg total) by mouth daily.   metFORMIN (GLUCOPHAGE) 500 MG tablet TAKE ONE TABLET 500 MG BY MOUTH DAILY WITH BREAKFAST.   Multiple Vitamin (MULTIVITAMIN) tablet Take 1 tablet by mouth daily.   risperiDONE (RISPERDAL) 1 MG tablet Take 1 mg by mouth at bedtime. As needed   No facility-administered encounter medications on file as of 05/22/2022.    Allergies (verified) Patient has no known allergies.   History: Past Medical History:  Diagnosis Date   Cataract    Cervical stenosis of spine    [place]   Grade 1,total laparoscopic hysterectomy   History of colonoscopy 2014   within normal limits   History of ovarian cyst 01/2015    Hyperlipidemia    Rectal prolapse    Squamous cell carcinoma    face   Past Surgical History:  Procedure Laterality Date   ABDOMINAL HYSTERECTOMY     APPENDECTOMY     BILATERAL SALPINGOOPHORECTOMY  03/30/15   CATARACT EXTRACTION W/PHACO Right 02/06/2016   Procedure: CATARACT EXTRACTION PHACO AND INTRAOCULAR LENS PLACEMENT ([place];  Surgeon: SEstill Cotta MD;  Location: [facility] ORS;  Service: Ophthalmology;  Laterality: Right;  [place]:39 AP% 24.9 CDE 47.32 fluid pack lot # [id number]   CATARACT EXTRACTION W/PHACO Left 03/05/2016   Procedure: CATARACT EXTRACTION PHACO AND INTRAOCULAR LENS PLACEMENT (IOC);  Surgeon: SEstill Cotta MD;  Location: [facility] ORS;  Service: Ophthalmology;  Laterality: Left;  [place]:02 AP% 23.2 CDE 26.73 fluid pack lot # [id number]   COMBINED HYSTEROSCOPY DIAGNOSTIC / D&C  2016   DILATION AND CURETTAGE OF UTERUS     dnc     LAPAROSCOPIC HYSTERECTOMY  03/30/15   NASAL SINUS SURGERY     SKIN CANCER EXCISION     UPPER LIP   TONSILECTOMY, ADENOIDECTOMY, BILATERAL MYRINGOTOMY AND TUBES     TONSILLECTOMY     Family History  Problem Relation Age of Onset   Colon cancer Father        older age onset   COPD Father    COPD Mother    Breast cancer Neg Hx  Ovarian cancer Neg Hx    Diabetes Neg Hx    Heart disease Neg Hx    Social History   Socioeconomic History   Marital status: Single    Spouse name: Not on file   Number of children: 0   Years of education: PhD   Highest education level: Not on file  Occupational History   Occupation: Retired  Tobacco Use   Smoking status: Former    Packs/day: 0.25    Years: 20.00    Total pack years: 5.00    Types: Cigarettes    Quit date: 04/13/1981    Years since quitting: 41.1   Smokeless tobacco: Never  Vaping Use   Vaping Use: Never used  Substance and Sexual Activity   Alcohol use: No   Drug use: No   Sexual activity: Not Currently  Other Topics Concern   Not on file  Social History Narrative   Lives in  [place]. No children.      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      1-2 cups caffeine daily.   Social Determinants of Health   Financial Resource Strain: Low Risk  (05/22/2022)   Overall Financial Resource Strain (CARDIA)    Difficulty of Paying Living Expenses: Not hard at all  Food Insecurity: No Food Insecurity (05/22/2022)   Hunger Vital Sign    Worried About Running Out of Food in the Last Year: Never true    Ran Out of Food in the Last Year: Never true  Transportation Needs: No Transportation Needs (05/22/2022)   PRAPARE - Hydrologist (Medical): No    Lack of Transportation (Non-Medical): No  Physical Activity: Sufficiently Active (05/22/2022)   Exercise Vital Sign    Days of Exercise per Week: 5 days    Minutes of Exercise per Session: 30 min  Stress: No Stress Concern Present (05/22/2022)   [place]    Feeling of Stress : Not at all  Social Connections: Unknown (05/22/2022)   Social Connection and Isolation Panel [NHANES]    Frequency of Communication with Friends and Family: More than three times a week    Frequency of Social Gatherings with Friends and Family: More than three times a week    Attends Religious Services: Not on Advertising copywriter or Organizations: Not on file    Attends Archivist Meetings: Not on file    Marital Status: Not on file    Tobacco Counseling Counseling given: Not Answered   Clinical Intake:  Pre-visit preparation completed: Yes        Diabetes: Yes (Followed by PCP)  How often do you need to have someone help you when you read instructions, pamphlets, or other written materials from your doctor or pharmacy?: 1 - Never   Interpreter Needed?: No    Activities of Daily Living    05/22/2022    3:30 PM  In  your present state of health, do you have any difficulty performing the following activities:  Hearing? 0  Vision? 0  Difficulty concentrating or making decisions? 0  Walking or climbing stairs? 0  Dressing or bathing? 0  Doing errands, shopping? 0  Preparing Food and eating ? N  Using the Toilet? N  In the past six  months, have you accidently leaked urine? N  Do you have problems with loss of bowel control? N  Managing your Medications? N  Managing your Finances? N  Housekeeping or managing your Housekeeping? N    Patient Care Team: Leone Haven, MD as PCP - General (Family Medicine)  Indicate any recent Medical Services you may have received from other than [facility] providers in the past year (date may be approximate).     Assessment:   This is a routine wellness examination for [place].  Virtual Visit via Telephone Note  I connected with  Pamela Norman on 05/22/22 at  3:15 PM EDT by telephone and verified that I am speaking with the correct person using two identifiers.  Persons participating in the virtual visit: patient/Nurse Health Advisor   I discussed the limitations of performing an evaluation and management service by telehealth. We continued and completed visit with audio only. Some vital signs may be absent or patient reported.   Hearing/Vision screen Hearing Screening - Comments:: Patient is able to hear conversational tones without difficulty. No issues reported.  Vision Screening - Comments:: Followed by [facility] (Dr. Jeni Salles)  Wears reading glasses  Cataract extraction, bilateral No retinopathy They have regular follow up with the ophthalmologist  Dietary issues and exercise activities discussed: Current Exercise Habits: Home exercise routine, Intensity: Mild Regular diet Good water intake   Goals Addressed             This Visit's Progress    Increase physical activity       Use recumbent bike.     Reduce Sugar Intake        Portion control       Depression Screen    05/22/2022    3:28 PM 02/22/2021    1:30 PM 11/09/2020   12:49 PM 05/13/2020   11:13 AM 11/09/2019   12:51 PM 08/25/2019    3:38 PM 08/18/2018   12:59 PM  PHQ 2/9 Scores  PHQ - 2 Score 0 0 0 0 0 0 0  PHQ- 9 Score      0     Fall Risk    05/22/2022    3:30 PM 04/11/2022    1:53 PM 02/22/2021    1:30 PM 11/09/2020   12:49 PM 05/13/2020   11:13 AM  [place] in the past year? 0 0 0 0 0  Number falls in past yr:  0 0 0 0  Injury with Fall?  0 0    Follow up Falls evaluation completed  Falls evaluation completed Falls evaluation completed Falls evaluation completed    [place]: Home free of loose throw rugs in walkways, pet beds, electrical cords, etc? Yes  Adequate lighting in your home to reduce risk of falls? Yes   ASSISTIVE DEVICES UTILIZED TO PREVENT FALLS: Life alert? No  Use of a cane, walker or w/c? No   TIMED UP AND GO: Was the test performed? No .   Cognitive Function: Patient is alert and oriented x3.     08/18/2018    1:27 PM 08/15/2017    1:49 PM 08/09/2016    1:39 PM  MMSE - Mini Mental State Exam  Orientation to time '5 5 5  '$ Orientation to Place '5 5 5  '$ Registration '3 3 3  '$ Attention/ Calculation '5 5 5  '$ Recall '3 3 3  '$ Language- name 2 objects '2 2 2  '$ Language- repeat 1 1  1  Language- follow 3 step command '3 3 3  '$ Language- read & follow direction '1 1 1  '$ Write a sentence '1 1 1  '$ Copy design '1 1 1  '$ Total score '30 30 30        '$ 11/09/2019   12:55 PM  6CIT Screen  What Year? 0 points  What month? 0 points  What time? 0 points  Count back from 20 0 points  Months in reverse 0 points  Repeat phrase 0 points  Total Score 0 points    Immunizations Immunization History  Administered Date(s) Administered   Fluad Quad(high Dose 65+) 09/23/2020   Influenza, High Dose Seasonal PF 09/04/2018   Influenza,inj,Quad PF,6+ Mos 10/05/2019   Influenza-Unspecified 01/24/2016,  08/16/2016, 08/10/2018   PFIZER(Purple Top)SARS-COV-2 Vaccination 01/06/2020, 01/27/2020, 08/30/2020   Pneumococcal Conjugate-13 05/21/2014   Pneumococcal Polysaccharide-23 08/09/2016   Zoster Recombinat (Shingrix) 03/22/2017, 06/21/2017   TDAP status: Due, Education has been provided regarding the importance of this vaccine. Advised may receive this vaccine at local pharmacy or Health Dept. Aware to provide a copy of the vaccination record if obtained from local pharmacy or Health Dept. Verbalized acceptance and understanding.  Screening Tests Health Maintenance  Topic Date Due   MAMMOGRAM  04/21/2022   COVID-19 Vaccine (4 - Pfizer series) 06/07/2022 (Originally 10/25/2020)   FOOT EXAM  06/11/2022 (Originally 12/12/2018)   HEMOGLOBIN A1C  06/11/2022 (Originally 07/28/2021)   URINE MICROALBUMIN  06/11/2022 (Originally 09/23/2021)   TETANUS/TDAP  05/23/2023 (Originally 01/30/1960)   INFLUENZA VACCINE  07/10/2022   OPHTHALMOLOGY EXAM  02/17/2023   Pneumonia Vaccine 89+ Years old  Completed   Zoster Vaccines- Shingrix  Completed   HPV VACCINES  Aged Out   DEXA SCAN  Discontinued   Health Maintenance Health Maintenance Due  Topic Date Due   MAMMOGRAM  04/21/2022   Hepatitis C Screening: does not qualify.  Vision Screening: Recommended annual ophthalmology exams for early detection of glaucoma and other disorders of the eye.  Dental Screening: Recommended annual dental exams for proper oral hygiene  Community Resource Referral / Chronic Care Management: CRR required this visit?  No   CCM required this visit?  No      Plan:   Keep all routine maintenance appointments.   I have personally reviewed and noted the following in the patient's chart:   Medical and social history Use of alcohol, tobacco or illicit drugs  Current medications and supplements including opioid prescriptions.  Functional ability and status Nutritional status Physical activity Advanced directives List  of other physicians Hospitalizations, surgeries, and ER visits in previous 12 months Vitals Screenings to include cognitive, depression, and falls Referrals and appointments  In addition, I have reviewed and discussed with patient certain preventive protocols, quality metrics, and best practice recommendations. A written personalized care plan for preventive services as well as general preventive health recommendations were provided to patient.     Varney Biles, LPN   7/68/0881

## 2022-06-13 ENCOUNTER — Ambulatory Visit (INDEPENDENT_AMBULATORY_CARE_PROVIDER_SITE_OTHER): Payer: Medicare Other | Admitting: Family Medicine

## 2022-06-13 ENCOUNTER — Encounter: Payer: Self-pay | Admitting: Family Medicine

## 2022-06-13 DIAGNOSIS — E785 Hyperlipidemia, unspecified: Secondary | ICD-10-CM

## 2022-06-13 DIAGNOSIS — E119 Type 2 diabetes mellitus without complications: Secondary | ICD-10-CM | POA: Diagnosis not present

## 2022-06-13 NOTE — Progress Notes (Signed)
Tommi Rumps, MD Phone: [phone]  Pamela Norman is a 81 y.o. female who presents today for f/u.  HYPERLIPIDEMIA Symptoms Chest pain on exertion:  no   Leg claudication:   no Medications: Compliance- taking lipitor Right upper quadrant pain- no  Muscle aches- no Lipid Panel     Component Value Date/Time   CHOL 152 11/19/2019 1048   TRIG 145.0 11/19/2019 1048   HDL 33.00 (L) 11/19/2019 1048   CHOLHDL 5 11/19/2019 1048   VLDL 29.0 11/19/2019 1048   LDLCALC 90 11/19/2019 1048   LDLDIRECT 82.0 09/23/2020 0930   DIABETES Disease Monitoring: Blood Sugar ranges-not checking frequently Polyuria/phagia/dipsia- no      Optho- UTD Medications: Compliance- taking metformin Hypoglycemic symptoms- no Patient notes she is moving in the near future and will have more control over her diet and exercise routine. She wants to delay labs until she has had time to work on this. Reports she had labs through psychiatry a few months ago.    Social History   Tobacco Use  Smoking Status Former   Packs/day: 0.25   Years: 20.00   Total pack years: 5.00   Types: Cigarettes   Quit date: 04/13/1981   Years since quitting: 41.1  Smokeless Tobacco Never    Current Outpatient Medications on File Prior to Visit  Medication Sig Dispense Refill   atorvastatin (LIPITOR) 40 MG tablet TAKE 1 TABLET(40 MG) BY MOUTH DAILY 90 tablet 1   cholecalciferol (VITAMIN D) 1000 units tablet Take 1,000 Units by mouth daily.     DULoxetine (CYMBALTA) 30 MG capsule Take 1 capsule (30 mg total) by mouth daily. 30 capsule 2   metFORMIN (GLUCOPHAGE) 500 MG tablet TAKE ONE TABLET 500 MG BY MOUTH DAILY WITH BREAKFAST. 90 tablet 1   Multiple Vitamin (MULTIVITAMIN) tablet Take 1 tablet by mouth daily.     risperiDONE (RISPERDAL) 1 MG tablet Take 1 mg by mouth at bedtime. As needed     No current facility-administered medications on file prior to visit.     ROS see history of present illness  Objective  Physical  Exam Vitals:   06/13/22 1534  BP: 130/70  Pulse: 82  Temp: 98.9 F (37.2 C)  SpO2: 96%    BP Readings from Last 3 Encounters:  06/13/22 130/70  04/11/22 (!) 163/90  01/28/21 (!) 157/81   Wt Readings from Last 3 Encounters:  06/13/22 194 lb 9.6 oz (88.3 kg)  05/22/22 188 lb (85.3 kg)  04/11/22 188 lb 1.6 oz (85.3 kg)    Physical Exam Constitutional:      General: She is not in acute distress.    Appearance: She is not diaphoretic.  Cardiovascular:     Rate and Rhythm: Normal rate and regular rhythm.     Heart sounds: Normal heart sounds.  Pulmonary:     Effort: Pulmonary effort is normal.     Breath sounds: Normal breath sounds.  Skin:    General: Skin is warm and dry.  Neurological:     Mental Status: She is alert.    Diabetic Foot Exam - Simple   Simple Foot Form Diabetic Foot exam was performed with the following findings: Yes 06/13/2022  3:49 PM  Visual Inspection No deformities, no ulcerations, no other skin breakdown bilaterally: Yes Sensation Testing Intact to touch and monofilament testing bilaterally: Yes Pulse Check Posterior Tibialis and Dorsalis pulse intact bilaterally: Yes Comments      Assessment/Plan: Please see individual problem list.  Problem List Items  Addressed This Visit     Diabetes mellitus without complication ([place]) (Chronic)    Undetermined control. She has deferred labs today. We will request her recent labs from psychiatry. She will continue metformin 500 mg daily.      Hyperlipidemia (Chronic)    Undetermined control. She has deferred labs today. Requesting labs from psychiatry to see if they checked her lipid panel and hepatic function. She will continue crestor 40 mg daily.         Return in about 4 months (around 10/14/2022) for Diabetes/hyperlipidemia.   Tommi Rumps, MD [place]

## 2022-06-13 NOTE — Assessment & Plan Note (Signed)
Undetermined control. She has deferred labs today. We will request her recent labs from psychiatry. She will continue metformin 500 mg daily.

## 2022-06-13 NOTE — Patient Instructions (Signed)
Nice to see you. Please work on diet and exercise as you plan to.

## 2022-06-13 NOTE — Assessment & Plan Note (Signed)
Undetermined control. She has deferred labs today. Requesting labs from psychiatry to see if they checked her lipid panel and hepatic function. She will continue crestor 40 mg daily.

## 2022-07-06 ENCOUNTER — Other Ambulatory Visit: Payer: Self-pay | Admitting: Family Medicine

## 2022-07-06 DIAGNOSIS — E119 Type 2 diabetes mellitus without complications: Secondary | ICD-10-CM

## 2022-07-15 ENCOUNTER — Other Ambulatory Visit: Payer: Self-pay

## 2022-07-15 ENCOUNTER — Emergency Department: Payer: Medicare Other

## 2022-07-15 DIAGNOSIS — M25561 Pain in right knee: Secondary | ICD-10-CM | POA: Diagnosis not present

## 2022-07-15 DIAGNOSIS — W010XXA Fall on same level from slipping, tripping and stumbling without subsequent striking against object, initial encounter: Secondary | ICD-10-CM | POA: Diagnosis not present

## 2022-07-15 DIAGNOSIS — Z7984 Long term (current) use of oral hypoglycemic drugs: Secondary | ICD-10-CM | POA: Diagnosis not present

## 2022-07-15 DIAGNOSIS — M79604 Pain in right leg: Secondary | ICD-10-CM | POA: Diagnosis not present

## 2022-07-15 DIAGNOSIS — E119 Type 2 diabetes mellitus without complications: Secondary | ICD-10-CM | POA: Diagnosis not present

## 2022-07-15 NOTE — ED Triage Notes (Signed)
Pt presents to ER with c/o right knee pain that started after she tripped over a vacuum and fell.  Pt states her right knee became twisted when she tried to get up.  Some swelling noted to right knee.  Pt denies hitting her head when falling.  Pt is A&O x4 at this time in NAD.  Pt states she is able to bear some weight on her leg.

## 2022-07-16 ENCOUNTER — Emergency Department
Admission: EM | Admit: 2022-07-16 | Discharge: 2022-07-16 | Disposition: A | Discharge status: Home / Self Care | Payer: Medicare Other | Attending: Emergency Medicine | Admitting: Emergency Medicine

## 2022-07-16 DIAGNOSIS — M25561 Pain in right knee: Secondary | ICD-10-CM

## 2022-07-16 DIAGNOSIS — W19XXXA Unspecified fall, initial encounter: Secondary | ICD-10-CM

## 2022-07-16 NOTE — ED Provider Notes (Signed)
[facility] Provider Note    Event Date/Time   First MD Initiated Contact with Patient 07/16/22 [phone]     (approximate)   History   Fall and Knee Pain   HPI  Pamela Norman is a 81 y.o. female who presents to the ED from home with a chief complaint of right knee pain.  Patient is in the process of moving and was navigating her hallway walking over boxes when she tripped over a vacuum and fell into a box onto her buttocks.  Did not strike head or suffer LOC.  States her right knee became twisted when she tried to get up.  Denies anticoagulant use.  Patient is able to bear weight and ambulate.  Denies vision changes, neck pain, chest pain, shortness of breath, abdominal pain, nausea, vomiting or dizziness.     Past Medical History   Past Medical History:  Diagnosis Date   Cataract    Cervical stenosis of spine    Endometrial cancer ([place]) 2016   Grade 1,total laparoscopic hysterectomy   History of colonoscopy 2014   within normal limits   History of ovarian cyst 01/2015   Hyperlipidemia    Rectal prolapse    Squamous cell carcinoma    face     Active Problem List   Patient Active Problem List   Diagnosis Date Noted   Elevated TSH 02/22/2021   Prolonged QT interval 01/27/2021   Overweight 05/13/2019   Skin lesions 12/12/2017   Elevated blood pressure reading 12/12/2017   Diabetes mellitus without complication ([place]) 62/70/3500   History of psychosis 09/17/2016   Osteopenia 09/17/2016   Vitamin D deficiency 09/17/2016   History of endometrial cancer 05/04/2015   Hyperlipidemia 05/21/2014     Past Surgical History   Past Surgical History:  Procedure Laterality Date   ABDOMINAL HYSTERECTOMY     APPENDECTOMY     BILATERAL SALPINGOOPHORECTOMY  03/30/15   CATARACT EXTRACTION W/PHACO Right 02/06/2016   Procedure: CATARACT EXTRACTION PHACO AND INTRAOCULAR LENS PLACEMENT ([place]);  Surgeon: Estill Cotta, MD;  Location: [facility] ORS;  Service:  Ophthalmology;  Laterality: Right;  [place] 01:39 AP% 24.9 CDE 47.32 fluid pack lot # [id number]   CATARACT EXTRACTION W/PHACO Left 03/05/2016   Procedure: CATARACT EXTRACTION PHACO AND INTRAOCULAR LENS PLACEMENT (IOC);  Surgeon: Estill Cotta, MD;  Location: [facility] ORS;  Service: Ophthalmology;  Laterality: Left;  [place] 01:02 AP% 23.2 CDE 26.73 fluid pack lot # [id number]   COMBINED HYSTEROSCOPY DIAGNOSTIC / D&C  2016   DILATION AND CURETTAGE OF UTERUS     dnc     LAPAROSCOPIC HYSTERECTOMY  03/30/15   NASAL SINUS SURGERY     SKIN CANCER EXCISION     UPPER LIP   TONSILECTOMY, ADENOIDECTOMY, BILATERAL MYRINGOTOMY AND TUBES     TONSILLECTOMY       Home Medications   Prior to Admission medications   Medication Sig Start Date End Date Taking? Authorizing Provider  atorvastatin (LIPITOR) 40 MG tablet TAKE 1 TABLET(40 MG) BY MOUTH DAILY 06/13/21   Leone Haven, MD  cholecalciferol (VITAMIN D) 1000 units tablet Take 1,000 Units by mouth daily.    [provider]  DULoxetine (CYMBALTA) 30 MG capsule Take 1 capsule (30 mg total) by mouth daily. 03/24/19   Earleen Newport, MD  metFORMIN (GLUCOPHAGE) 500 MG tablet TAKE 1 TABLET(500 MG) BY MOUTH DAILY WITH BREAKFAST 07/06/22   Leone Haven, MD  Multiple Vitamin (MULTIVITAMIN) tablet Take 1 tablet by mouth  daily.    [provider]  risperiDONE (RISPERDAL) 1 MG tablet Take 1 mg by mouth at bedtime. As needed 10/12/15   [provider]     Allergies  Patient has no known allergies.   Family History   Family History  Problem Relation Age of Onset   Colon cancer Father        older age onset   COPD Father    COPD Mother    Breast cancer Neg Hx    Ovarian cancer Neg Hx    Diabetes Neg Hx    Heart disease Neg Hx      Physical Exam  Triage Vital Signs: ED Triage Vitals  Enc Vitals Group     BP 07/15/22 2233 (!) 203/98     Pulse Rate 07/15/22 2233 95     Resp 07/15/22 2233 16     Temp 07/15/22 2233  97.9 F (36.6 C)     Temp Source 07/15/22 2233 Oral     SpO2 07/15/22 2233 97 %     Weight 07/15/22 2234 189 lb (85.7 kg)     Height 07/15/22 2234 5' 6.5" (1.689 m)     Head Circumference --      Peak Flow --      Pain Score 07/15/22 2233 8     Pain Loc --      Pain Edu? --      Excl. in [place]? --     Updated Vital Signs: BP (!) 203/98   Pulse 95   Temp 97.9 F (36.6 C) (Oral)   Resp 16   Ht 5' 6.5" (1.689 m)   Wt 85.7 kg   SpO2 97%   BMI 30.05 kg/m    General: Awake, no distress.  CV:  RRR.  Good peripheral perfusion.  Resp:  Normal effort.  CTA B. Abd:  Nontender.  No distention.  Other:  No cervical spine tenderness to palpation.  Pelvis is stable.  Right hip full range of motion without pain.  Patient ambulating around the room with steady gait.  Right knee with mild swelling.  Full range of motion with mild discomfort.  Calf is nontender and nonswollen.   ED Results / Procedures / Treatments  Labs (all labs ordered are listed, but only abnormal results are displayed) Labs Reviewed - No data to display   EKG  None   RADIOLOGY I have independently visualized and interpreted patient's x-rays as well as noted the radiology and interpretation:  Right femur: No fracture  Right knee: No fracture or dislocation; tricompartmental osteoarthritis  Official radiology report(s): DG Femur Min 2 Views Right  Result Date: 07/15/2022 CLINICAL DATA:  Right leg pain.  Fall today. EXAM: RIGHT FEMUR 2 VIEWS COMPARISON:  None Available. FINDINGS: The cortical margins of the femur are intact. There is no evidence of fracture or other focal bone lesions. Hip alignment is maintained. Intact pubic rami. Soft tissues are unremarkable. IMPRESSION: Intact right femur, no fracture. Electronically Signed   By: Keith Rake M.D.   On: 07/15/2022 23:17   DG Knee Complete 4 Views Right  Result Date: 07/15/2022 CLINICAL DATA:  Fall today twisting right knee.  Right knee pain. EXAM: RIGHT  KNEE - COMPLETE 4+ VIEW COMPARISON:  None Available. FINDINGS: No fracture or dislocation. Mild tricompartmental peripheral spurring and spurring of the tibial spines. No significant knee joint effusion. Small quadriceps and patellar tendon enthesophytes. No focal soft tissue abnormalities are seen. IMPRESSION: 1. No fracture or dislocation  of the right knee. 2. Mild tricompartmental osteoarthritis. Electronically Signed   By: Keith Rake M.D.   On: 07/15/2022 23:16     PROCEDURES:  Critical Care performed: No  Procedures   MEDICATIONS ORDERED IN ED: Medications - No data to display   IMPRESSION / MDM / [place] / ED COURSE  I reviewed the triage vital signs and the nursing notes.                             81 year old female presenting with right knee pain status post mechanical fall.  Will place an Ace wrap and provide walker.  Patient will take Tylenol and or Ibuprofen as needed for pain.  Patient's niece tried to address her concern that patient seems and feels "out of sorts" and sometimes that happens when her Respirdal dosage is not correct but patient dismisses her concern.  I did offer to evaluate her further with lab work, urine, EKG but patient declines.  Strict return precautions given.  Patient and family members verbalized understanding agree with plan of care.  Blood pressure noted high tonight; patient will check her blood pressure in the comfort of her own home.  Patient's presentation is most consistent with acute, uncomplicated illness.  FINAL CLINICAL IMPRESSION(S) / ED DIAGNOSES   Final diagnoses:  Fall, initial encounter  Acute pain of right knee     Rx / DC Orders   ED Discharge Orders     None        Note:  This document was prepared using Dragon voice recognition software and may include unintentional dictation errors.   Paulette Blanch, MD 07/16/22 [phone]

## 2022-07-16 NOTE — Discharge Instructions (Addendum)
You may take Tylenol and/or Ibuprofen as needed for pain.  You may remove Ace wrap to bathe and sleep.  Use walker to help you balance as you walk.  Keep a check on your blood pressure at home.  Return to the ER for worsening symptoms, persistent vomiting, difficulty breathing or other concerns.

## 2022-07-16 NOTE — ED Notes (Signed)
Pt reports R knee pain - no deformity noted. ACE wrap applied and 6Ps intact at this time.

## 2022-08-28 ENCOUNTER — Telehealth: Payer: Self-pay | Admitting: Family Medicine

## 2022-08-28 NOTE — Telephone Encounter (Signed)
Pt need a refill of DULoxetine and risperiDONE sent to [facility] in [place]. Pt stated her pharmacy where she get these medications filled has closed

## 2022-08-29 MED ORDER — DULOXETINE HCL 30 MG PO CPEP: 30.0000 mg | ORAL_CAPSULE | Freq: Every day | ORAL | 0 refills | Status: DC

## 2022-08-29 MED ORDER — RISPERIDONE 1 MG PO TABS: 1.0000 mg | ORAL_TABLET | Freq: Every day | ORAL | 0 refills | Status: DC

## 2022-08-29 NOTE — Telephone Encounter (Signed)
These should be filled by psychiatry. Has she contacted her psychiatrist to get these refilled?

## 2022-08-29 NOTE — Telephone Encounter (Signed)
Per Fulton Mole, this patient has reached out to the psychiatry office and they do not seem to be open. I will refill for a short period of time and if her psychiatrist is not open anymore she will need to be referred to a new psychiatrist.

## 2022-09-07 DIAGNOSIS — Z23 Encounter for immunization: Secondary | ICD-10-CM | POA: Diagnosis not present

## 2022-10-15 ENCOUNTER — Ambulatory Visit: Payer: Medicare Other | Admitting: Family Medicine

## 2022-10-26 ENCOUNTER — Telehealth: Payer: Self-pay | Admitting: Family Medicine

## 2022-10-26 DIAGNOSIS — E559 Vitamin D deficiency, unspecified: Secondary | ICD-10-CM

## 2022-10-26 DIAGNOSIS — E785 Hyperlipidemia, unspecified: Secondary | ICD-10-CM

## 2022-10-26 DIAGNOSIS — E119 Type 2 diabetes mellitus without complications: Secondary | ICD-10-CM

## 2022-10-26 NOTE — Telephone Encounter (Signed)
Ordered

## 2022-10-26 NOTE — Telephone Encounter (Signed)
Patient has a lab appt 10/29/2022, there are no orders in.

## 2022-10-26 NOTE — Addendum Note (Signed)
Addended by: Leone Haven on: 10/26/2022 09:23 AM   Modules accepted: Orders

## 2022-10-26 NOTE — Telephone Encounter (Signed)
Labs put in today.  Emily Massar,cma

## 2022-10-29 ENCOUNTER — Other Ambulatory Visit (INDEPENDENT_AMBULATORY_CARE_PROVIDER_SITE_OTHER): Payer: Medicare Other

## 2022-10-29 DIAGNOSIS — E119 Type 2 diabetes mellitus without complications: Secondary | ICD-10-CM

## 2022-10-29 DIAGNOSIS — E559 Vitamin D deficiency, unspecified: Secondary | ICD-10-CM

## 2022-10-29 DIAGNOSIS — E785 Hyperlipidemia, unspecified: Secondary | ICD-10-CM | POA: Diagnosis not present

## 2022-10-29 LAB — COMPREHENSIVE METABOLIC PANEL
ALT: 11 U/L (ref 0–35)
AST: 11 U/L (ref 0–37)
Albumin: 4.2 g/dL (ref 3.5–5.2)
Alkaline Phosphatase: 70 U/L (ref 39–117)
BUN: 23 mg/dL (ref 6–23)
CO2: 29 mEq/L (ref 19–32)
Calcium: 9.3 mg/dL (ref 8.4–10.5)
Chloride: 103 mEq/L (ref 96–112)
Creatinine, Ser: 0.86 mg/dL (ref 0.40–1.20)
GFR: 63.66 mL/min (ref >60.00)
Glucose, Bld: 139 mg/dL — ABNORMAL HIGH (ref 70–99)
Potassium: 4.4 mEq/L (ref 3.5–5.1)
Sodium: 140 mEq/L (ref 135–145)
Total Bilirubin: 0.5 mg/dL (ref 0.2–1.2)
Total Protein: 6.7 g/dL (ref 6.0–8.3)

## 2022-10-29 LAB — HEMOGLOBIN A1C: Hgb A1c MFr Bld: 6.9 % — ABNORMAL HIGH (ref 4.6–6.5)

## 2022-10-29 LAB — LIPID PANEL
Cholesterol: 147 mg/dL (ref 0–200)
HDL: 34.2 mg/dL — ABNORMAL LOW (ref >39.00)
LDL Cholesterol: 77 mg/dL (ref 0–99)
NonHDL: 112.41
Total CHOL/HDL Ratio: 4
Triglycerides: 178 mg/dL — ABNORMAL HIGH (ref 0.0–149.0)
VLDL: 35.6 mg/dL (ref 0.0–40.0)

## 2022-10-29 LAB — VITAMIN D 25 HYDROXY (VIT D DEFICIENCY, FRACTURES): VITD: 38.85 ng/mL (ref 30.00–100.00)

## 2022-10-29 LAB — TSH: TSH: 5.64 u[IU]/mL — ABNORMAL HIGH (ref 0.35–5.50)

## 2022-10-31 ENCOUNTER — Other Ambulatory Visit: Payer: Self-pay | Admitting: Family Medicine

## 2022-10-31 DIAGNOSIS — E785 Hyperlipidemia, unspecified: Secondary | ICD-10-CM

## 2022-11-14 LAB — MICROALBUMIN / CREATININE URINE RATIO
Creatinine,U: 25.2 mg/dL
Microalb Creat Ratio: 2.8 mg/g (ref 0.0–30.0)
Microalb, Ur: <0.7 mg/dL (ref 0.0–1.9)

## 2022-11-30 DIAGNOSIS — Z23 Encounter for immunization: Secondary | ICD-10-CM | POA: Diagnosis not present

## 2022-12-10 ENCOUNTER — Other Ambulatory Visit: Payer: Self-pay | Admitting: Family Medicine

## 2022-12-25 ENCOUNTER — Other Ambulatory Visit: Payer: Self-pay | Admitting: Family Medicine

## 2023-01-01 DIAGNOSIS — H179 Unspecified corneal scar and opacity: Secondary | ICD-10-CM | POA: Diagnosis not present

## 2023-01-01 DIAGNOSIS — Z961 Presence of intraocular lens: Secondary | ICD-10-CM | POA: Diagnosis not present

## 2023-01-01 DIAGNOSIS — H16223 Keratoconjunctivitis sicca, not specified as Sjogren's, bilateral: Secondary | ICD-10-CM | POA: Diagnosis not present

## 2023-01-01 LAB — HM DIABETES EYE EXAM

## 2023-01-13 ENCOUNTER — Other Ambulatory Visit: Payer: Self-pay | Admitting: Family Medicine

## 2023-01-29 ENCOUNTER — Other Ambulatory Visit (INDEPENDENT_AMBULATORY_CARE_PROVIDER_SITE_OTHER): Payer: Medicare Other

## 2023-01-29 DIAGNOSIS — E785 Hyperlipidemia, unspecified: Secondary | ICD-10-CM

## 2023-01-29 LAB — LIPID PANEL
Cholesterol: 168 mg/dL (ref 0–200)
HDL: 36.2 mg/dL — ABNORMAL LOW (ref >39.00)
LDL Cholesterol: 102 mg/dL — ABNORMAL HIGH (ref 0–99)
NonHDL: 131.79
Total CHOL/HDL Ratio: 5
Triglycerides: 151 mg/dL — ABNORMAL HIGH (ref 0.0–149.0)
VLDL: 30.2 mg/dL (ref 0.0–40.0)

## 2023-01-30 ENCOUNTER — Ambulatory Visit (INDEPENDENT_AMBULATORY_CARE_PROVIDER_SITE_OTHER): Payer: Medicare Other | Admitting: Family Medicine

## 2023-01-30 ENCOUNTER — Encounter: Payer: Self-pay | Admitting: Family Medicine

## 2023-01-30 VITALS — BP 124/82 | HR 96 | Temp 97.8°F | Ht 66.5 in | Wt 175.0 lb

## 2023-01-30 DIAGNOSIS — E119 Type 2 diabetes mellitus without complications: Secondary | ICD-10-CM | POA: Diagnosis not present

## 2023-01-30 DIAGNOSIS — E785 Hyperlipidemia, unspecified: Secondary | ICD-10-CM | POA: Diagnosis not present

## 2023-01-30 LAB — POCT GLYCOSYLATED HEMOGLOBIN (HGB A1C): Hemoglobin A1C: 6.2 % — AB (ref 4.0–5.6)

## 2023-01-30 MED ORDER — ATORVASTATIN CALCIUM 80 MG PO TABS: 80.0000 mg | ORAL_TABLET | Freq: Every day | ORAL | 3 refills | Status: DC

## 2023-01-30 NOTE — Assessment & Plan Note (Addendum)
Chronic issue.  LDL slightly worse.  Will increase her Lipitor to 80 mg daily.  She will return in 6 weeks for labs.  Discussed the risk of elevated liver enzymes and myalgias with the increased dose of Lipitor.

## 2023-01-30 NOTE — Progress Notes (Signed)
Pamela Rumps, MD Phone: [phone]  Pamela Norman is a 82 y.o. female who presents today for follow-up.  DIABETES Disease Monitoring: Blood Sugar ranges-not checking Polyuria/phagia/dipsia- no      Optho- UTD Medications: Compliance- taking metformin Hypoglycemic symptoms- no Notes she has been eating some unhealthy stuff recently.  HYPERLIPIDEMIA Symptoms Chest pain on exertion:  no   Leg claudication:   no Medications: Compliance- taking lipitor Right upper quadrant pain- no  Muscle aches- no Lipid Panel     Component Value Date/Time   CHOL 168 01/29/2023 1018   TRIG 151.0 (H) 01/29/2023 1018   HDL 36.20 (L) 01/29/2023 1018   CHOLHDL 5 01/29/2023 1018   VLDL 30.2 01/29/2023 1018   LDLCALC 102 (H) 01/29/2023 1018   LDLDIRECT 82.0 09/23/2020 0930      Social History   Tobacco Use  Smoking Status Former   Packs/day: 0.25   Years: 20.00   Total pack years: 5.00   Types: Cigarettes   Quit date: 04/13/1981   Years since quitting: 41.8  Smokeless Tobacco Never    Current Outpatient Medications on File Prior to Visit  Medication Sig Dispense Refill   cholecalciferol (VITAMIN D) 1000 units tablet Take 1,000 Units by mouth daily.     DULoxetine (CYMBALTA) 30 MG capsule TAKE 1 CAPSULE(30 MG) BY MOUTH DAILY 90 capsule 3   metFORMIN (GLUCOPHAGE) 500 MG tablet TAKE 1 TABLET(500 MG) BY MOUTH DAILY WITH BREAKFAST 90 tablet 1   Multiple Vitamin (MULTIVITAMIN) tablet Take 1 tablet by mouth daily.     risperiDONE (RISPERDAL) 1 MG tablet TAKE 1 TABLET(1 MG) BY MOUTH AT BEDTIME AS NEEDED 90 tablet 0   No current facility-administered medications on file prior to visit.     ROS see history of present illness  Objective  Physical Exam Vitals:   01/30/23 1356  BP: 124/82  Pulse: 96  Temp: 97.8 F (36.6 C)  SpO2: 98%    BP Readings from Last 3 Encounters:  01/30/23 124/82  07/16/22 (!) 184/99  06/13/22 130/70   Wt Readings from Last 3 Encounters:  01/30/23  175 lb (79.4 kg)  07/15/22 189 lb (85.7 kg)  06/13/22 194 lb 9.6 oz (88.3 kg)    Physical Exam Constitutional:      General: She is not in acute distress.    Appearance: She is not diaphoretic.  Cardiovascular:     Rate and Rhythm: Normal rate and regular rhythm.     Heart sounds: Normal heart sounds.  Pulmonary:     Effort: Pulmonary effort is normal.     Breath sounds: Normal breath sounds.  Skin:    General: Skin is warm and dry.  Neurological:     Mental Status: She is alert.      Assessment/Plan: Please see individual problem list.  Diabetes mellitus without complication ([place]) Assessment & Plan: Adequately controlled.  Patient will continue metformin 500 mg daily.  Encouraged healthier diet.  Orders: -     POCT glycosylated hemoglobin (Hb A1C)  Hyperlipidemia, unspecified hyperlipidemia type Assessment & Plan: Chronic issue.  LDL slightly worse.  Will increase her Lipitor to 80 mg daily.  She will return in 6 weeks for labs.  Discussed the risk of elevated liver enzymes and myalgias with the increased dose of Lipitor.  Orders: -     Hepatic function panel; Future -     Lipid panel; Future -     Atorvastatin Calcium; Take 1 tablet (80 mg total) by mouth daily.  Dispense: 90 tablet; Refill: 3     Health Maintenance: Patient will call to schedule her mammogram.  Return in about 6 weeks (around 03/13/2023) for Labs, 6 months with PCP.   Pamela Rumps, MD [place]

## 2023-01-30 NOTE — Patient Instructions (Signed)
Nice to see you. You are going to increase your Lipitor to 80 mg daily.  We will see back in 6 weeks for labs.  I will follow-up with you in 6 months.

## 2023-01-30 NOTE — Assessment & Plan Note (Signed)
Adequately controlled.  Patient will continue metformin 500 mg daily.  Encouraged healthier diet.

## 2023-02-19 ENCOUNTER — Telehealth: Payer: Self-pay | Admitting: Family Medicine

## 2023-02-19 NOTE — Telephone Encounter (Signed)
Patient called and needs a form completed by Dr Caryl Bis for [facility], same form he completed a few years ago. She states she needs her balance checked and a TB test. No appointments available any time soon. Did offer patent 03/01/23 at 4:30, patient stated this had to been done sooner then that. Where does Dr Caryl Bis want to schedule her?

## 2023-02-20 NOTE — Telephone Encounter (Signed)
12 pm on 3/18 is the soonest available appointment.

## 2023-02-21 ENCOUNTER — Other Ambulatory Visit: Payer: Self-pay | Admitting: Obstetrics and Gynecology

## 2023-02-21 DIAGNOSIS — Z1231 Encounter for screening mammogram for malignant neoplasm of breast: Secondary | ICD-10-CM

## 2023-02-21 NOTE — Telephone Encounter (Signed)
Patient is scheduled to see Dr. Caryl Bis 02/22/23 at 1:45.

## 2023-02-22 ENCOUNTER — Ambulatory Visit (INDEPENDENT_AMBULATORY_CARE_PROVIDER_SITE_OTHER): Payer: Medicare Other | Admitting: Family Medicine

## 2023-02-22 ENCOUNTER — Encounter: Payer: Self-pay | Admitting: Family Medicine

## 2023-02-22 VITALS — BP 126/80 | HR 93 | Temp 97.8°F | Ht 66.5 in | Wt 175.4 lb

## 2023-02-22 DIAGNOSIS — Z111 Encounter for screening for respiratory tuberculosis: Secondary | ICD-10-CM

## 2023-02-22 DIAGNOSIS — E119 Type 2 diabetes mellitus without complications: Secondary | ICD-10-CM

## 2023-02-22 DIAGNOSIS — Z022 Encounter for examination for admission to residential institution: Secondary | ICD-10-CM | POA: Diagnosis not present

## 2023-02-22 NOTE — Assessment & Plan Note (Signed)
Chronic issue.  Adequately controlled.  She will continue metformin 500 mg daily.

## 2023-02-22 NOTE — Progress Notes (Signed)
  Tommi Rumps, MD Phone: [phone]  Pamela Norman is a 82 y.o. female who presents today for f/u.  Diabetes: Patient continues on metformin.  She does not check sugars at home.  Most recent A1c is acceptably controlled.  Patient also has a form she needs filled out for her new living facility.  She requires a tuberculosis test for this.  The form will be scanned into the chart once completed.  Patient reports no prior hospitalizations for psychiatric issues.  Social History   Tobacco Use  Smoking Status Former   Packs/day: 0.25   Years: 20.00   Additional pack years: 0.00   Total pack years: 5.00   Types: Cigarettes   Quit date: 04/13/1981   Years since quitting: 41.8  Smokeless Tobacco Never    Current Outpatient Medications on File Prior to Visit  Medication Sig Dispense Refill   atorvastatin (LIPITOR) 80 MG tablet Take 1 tablet (80 mg total) by mouth daily. 90 tablet 3   cholecalciferol (VITAMIN D) 1000 units tablet Take 1,000 Units by mouth daily.     DULoxetine (CYMBALTA) 30 MG capsule TAKE 1 CAPSULE(30 MG) BY MOUTH DAILY 90 capsule 3   metFORMIN (GLUCOPHAGE) 500 MG tablet TAKE 1 TABLET(500 MG) BY MOUTH DAILY WITH BREAKFAST 90 tablet 1   Multiple Vitamin (MULTIVITAMIN) tablet Take 1 tablet by mouth daily.     risperiDONE (RISPERDAL) 1 MG tablet TAKE 1 TABLET(1 MG) BY MOUTH AT BEDTIME AS NEEDED 90 tablet 0   No current facility-administered medications on file prior to visit.     ROS see history of present illness  Objective  Physical Exam Vitals:   02/22/23 1404  BP: 126/80  Pulse: 93  Temp: 97.8 F (36.6 C)  SpO2: 98%    BP Readings from Last 3 Encounters:  02/22/23 126/80  01/30/23 124/82  07/16/22 (!) 184/99   Wt Readings from Last 3 Encounters:  02/22/23 175 lb 6.4 oz (79.6 kg)  01/30/23 175 lb (79.4 kg)  07/15/22 189 lb (85.7 kg)    Physical Exam Constitutional:      General: She is not in acute distress.    Appearance: She is not  diaphoretic.  HENT:     Ears:     Comments: Hearing intact to normal conversation    Mouth/Throat:     Mouth: Mucous membranes are moist.     Pharynx: Oropharynx is clear.  Cardiovascular:     Rate and Rhythm: Normal rate and regular rhythm.     Heart sounds: Normal heart sounds.  Pulmonary:     Effort: Pulmonary effort is normal.     Breath sounds: Normal breath sounds.  Skin:    General: Skin is warm and dry.  Neurological:     Mental Status: She is alert.     Comments: Patient maintains balance with feet side-by-side and also with feet in the end      Assessment/Plan: Please see individual problem list.  Screening-pulmonary TB -     QuantiFERON-TB Gold Plus  Diabetes mellitus without complication ([place]) Assessment & Plan: Chronic issue.  Adequately controlled.  She will continue metformin 500 mg daily.   TB test ordered.  Form will be completed and faxed to her facility once the TB test returns.   Return for as scheduled.   Tommi Rumps, MD [place]

## 2023-02-25 LAB — QUANTIFERON-TB GOLD PLUS
Mitogen-NIL: 1.86 IU/mL
NIL: 0.02 IU/mL
QuantiFERON-TB Gold Plus: NEGATIVE
TB1-NIL: 0 IU/mL
TB2-NIL: 0.01 IU/mL

## 2023-03-13 ENCOUNTER — Other Ambulatory Visit (INDEPENDENT_AMBULATORY_CARE_PROVIDER_SITE_OTHER): Payer: Medicare Other

## 2023-03-13 DIAGNOSIS — E785 Hyperlipidemia, unspecified: Secondary | ICD-10-CM | POA: Diagnosis not present

## 2023-03-13 LAB — LIPID PANEL
Cholesterol: 126 mg/dL (ref 0–200)
HDL: 36.5 mg/dL — ABNORMAL LOW (ref >39.00)
LDL Cholesterol: 70 mg/dL (ref 0–99)
NonHDL: 89.34
Total CHOL/HDL Ratio: 3
Triglycerides: 98 mg/dL (ref 0.0–149.0)
VLDL: 19.6 mg/dL (ref 0.0–40.0)

## 2023-03-13 LAB — HEPATIC FUNCTION PANEL
ALT: 20 U/L (ref 0–35)
AST: 15 U/L (ref 0–37)
Albumin: 4.3 g/dL (ref 3.5–5.2)
Alkaline Phosphatase: 87 U/L (ref 39–117)
Bilirubin, Direct: 0.1 mg/dL (ref 0.0–0.3)
Total Bilirubin: 0.4 mg/dL (ref 0.2–1.2)
Total Protein: 6.6 g/dL (ref 6.0–8.3)

## 2023-03-19 ENCOUNTER — Encounter: Payer: Self-pay | Admitting: *Deleted

## 2023-04-05 ENCOUNTER — Ambulatory Visit
Admission: RE | Admit: 2023-04-05 | Discharge: 2023-04-05 | Disposition: A | Discharge status: Home / Self Care | Payer: Medicare Other | Source: Ambulatory Visit | Attending: Obstetrics and Gynecology | Admitting: Obstetrics and Gynecology

## 2023-04-05 DIAGNOSIS — Z1231 Encounter for screening mammogram for malignant neoplasm of breast: Secondary | ICD-10-CM

## 2023-04-09 ENCOUNTER — Inpatient Hospital Stay
Admission: RE | Admit: 2023-04-09 | Discharge: 2023-04-09 | Disposition: A | Discharge status: Home / Self Care | Payer: Self-pay | Source: Ambulatory Visit | Attending: Family Medicine | Admitting: Family Medicine

## 2023-04-09 ENCOUNTER — Other Ambulatory Visit: Payer: Self-pay | Admitting: Obstetrics and Gynecology

## 2023-04-09 ENCOUNTER — Other Ambulatory Visit: Payer: Self-pay | Admitting: *Deleted

## 2023-04-09 DIAGNOSIS — R2232 Localized swelling, mass and lump, left upper limb: Secondary | ICD-10-CM

## 2023-04-09 DIAGNOSIS — Z1231 Encounter for screening mammogram for malignant neoplasm of breast: Secondary | ICD-10-CM

## 2023-04-09 DIAGNOSIS — R928 Other abnormal and inconclusive findings on diagnostic imaging of breast: Secondary | ICD-10-CM

## 2023-04-10 ENCOUNTER — Other Ambulatory Visit: Payer: Self-pay | Admitting: Obstetrics and Gynecology

## 2023-04-10 DIAGNOSIS — R928 Other abnormal and inconclusive findings on diagnostic imaging of breast: Secondary | ICD-10-CM

## 2023-04-19 ENCOUNTER — Other Ambulatory Visit: Payer: Self-pay | Admitting: Family Medicine

## 2023-04-24 ENCOUNTER — Telehealth: Payer: Self-pay | Admitting: Family Medicine

## 2023-04-24 NOTE — Telephone Encounter (Signed)
Contacted Pamela Norman to schedule their annual wellness visit. Patient declined to schedule AWV at this time. Try again next year   Verlee Rossetti; Care Guide Ambulatory Clinical Support [place] [facility] Direct Dial: [phone]

## 2023-05-07 ENCOUNTER — Ambulatory Visit
Admission: RE | Admit: 2023-05-07 | Discharge: 2023-05-07 | Disposition: A | Discharge status: Home / Self Care | Payer: Medicare Other | Source: Ambulatory Visit | Attending: Obstetrics and Gynecology | Admitting: Obstetrics and Gynecology

## 2023-05-07 DIAGNOSIS — R928 Other abnormal and inconclusive findings on diagnostic imaging of breast: Secondary | ICD-10-CM | POA: Diagnosis not present

## 2023-05-07 DIAGNOSIS — R2232 Localized swelling, mass and lump, left upper limb: Secondary | ICD-10-CM | POA: Insufficient documentation

## 2023-05-07 DIAGNOSIS — R590 Localized enlarged lymph nodes: Secondary | ICD-10-CM | POA: Diagnosis not present

## 2023-05-24 ENCOUNTER — Ambulatory Visit: Payer: Medicare Other

## 2023-06-06 ENCOUNTER — Other Ambulatory Visit: Payer: Self-pay | Admitting: Family Medicine

## 2023-06-06 NOTE — Telephone Encounter (Signed)
Rx was discontinued on 01/30/2023. Is it okay to refuse rx?

## 2023-07-01 NOTE — Progress Notes (Deleted)
Referring Physician:  Glori Luis, MD [address] [place],  [place] [address]  Primary Physician:  Glori Luis, MD  History of Present Illness: 07/01/2023 [name] is here today with a chief complaint of ***  several days of neck discomfort and sensation pulling from her neck down to her legs.   Duration: *** 4 years + Location: *** Quality: *** Severity: ***  Precipitating: aggravated by *** Modifying factors: made better by *** Weakness: none Timing: ***intermittently Bowel/Bladder Dysfunction: none  Conservative measures:  Physical therapy: *** ? denies Multimodal medical therapy including regular antiinflammatories: ***  Injections: *** ? Denies epidural steroid injections  Past Surgery: *** ? denies   Pamela Norman has ***no symptoms of cervical myelopathy.  The symptoms are causing a significant impact on the patient's life.   Review of Systems:  A 10 point review of systems is negative, except for the pertinent positives and negatives detailed in the HPI.  Past Medical History: Past Medical History:  Diagnosis Date   Cataract    Cervical stenosis of spine    Endometrial cancer ([facility]) 2016   Grade 1,total laparoscopic hysterectomy   History of colonoscopy 2014   within normal limits   History of ovarian cyst 01/2015   Hyperlipidemia    Rectal prolapse    Squamous cell carcinoma    face    Past Surgical History: Past Surgical History:  Procedure Laterality Date   ABDOMINAL HYSTERECTOMY     APPENDECTOMY     BILATERAL SALPINGOOPHORECTOMY  03/30/15   CATARACT EXTRACTION W/PHACO Right 02/06/2016   Procedure: CATARACT EXTRACTION PHACO AND INTRAOCULAR LENS PLACEMENT ([facility]);  Surgeon: Sallee Lange, MD;  Location: [facility] ORS;  Service: Ophthalmology;  Laterality: Right;  [place] 01:39 AP% 24.9 CDE 47.32 fluid pack lot # [id number]   CATARACT EXTRACTION W/PHACO Left 03/05/2016   Procedure: CATARACT EXTRACTION PHACO AND  INTRAOCULAR LENS PLACEMENT ([facility]);  Surgeon: Sallee Lange, MD;  Location: [facility] ORS;  Service: Ophthalmology;  Laterality: Left;  [place] 01:02 AP% 23.2 CDE 26.73 fluid pack lot # [id number]   COMBINED HYSTEROSCOPY DIAGNOSTIC / D&C  2016   DILATION AND CURETTAGE OF UTERUS     dnc     LAPAROSCOPIC HYSTERECTOMY  03/30/15   NASAL SINUS SURGERY     SKIN CANCER EXCISION     UPPER LIP   TONSILECTOMY, ADENOIDECTOMY, BILATERAL MYRINGOTOMY AND TUBES     TONSILLECTOMY      Allergies: Allergies as of 07/09/2023   (No Known Allergies)    Medications: Outpatient Encounter Medications as of 07/09/2023  Medication Sig   atorvastatin (LIPITOR) 80 MG tablet Take 1 tablet (80 mg total) by mouth daily.   cholecalciferol (VITAMIN D) 1000 units tablet Take 1,000 Units by mouth daily.   DULoxetine (CYMBALTA) 30 MG capsule TAKE 1 CAPSULE(30 MG) BY MOUTH DAILY   metFORMIN (GLUCOPHAGE) 500 MG tablet TAKE 1 TABLET(500 MG) BY MOUTH DAILY WITH BREAKFAST   Multiple Vitamin (MULTIVITAMIN) tablet Take 1 tablet by mouth daily.   risperiDONE (RISPERDAL) 1 MG tablet TAKE 1 TABLET(1 MG) BY MOUTH AT BEDTIME AS NEEDED   No facility-administered encounter medications on file as of 07/09/2023.    Social History: Social History   Tobacco Use   Smoking status: Former    Current packs/day: 0.00    Average packs/day: 0.3 packs/day for 20.0 years (5.0 ttl pk-yrs)    Types: Cigarettes    Start date: 04/13/1961    Quit date: 04/13/1981  Years since quitting: 42.2   Smokeless tobacco: Never  Vaping Use   Vaping status: Never Used  Substance Use Topics   Alcohol use: No   Drug use: No    Family Medical History: Family History  Problem Relation Age of Onset   Colon cancer Father        older age onset   COPD Father    COPD Mother    Breast cancer Neg Hx    Ovarian cancer Neg Hx    Diabetes Neg Hx    Heart disease Neg Hx     Physical Examination: @VITALWITHPAIN @  General: Patient is well developed, well  nourished, calm, collected, and in no apparent distress. Attention to examination is appropriate.  Psychiatric: Patient is non-anxious.  Head:  Pupils equal, round, and reactive to light.  ENT:  Oral mucosa appears well hydrated.  Neck:   Supple.  ***Full range of motion.  Respiratory: Patient is breathing without any difficulty.  Extremities: No edema.  Vascular: Palpable dorsal pedal pulses.  Skin:   On exposed skin, there are no abnormal skin lesions.  NEUROLOGICAL:     Awake, alert, oriented to person, place, and time.  Speech is clear and fluent. Fund of knowledge is appropriate.   Cranial Nerves: Pupils equal round and reactive to light.  Facial tone is symmetric.  Facial sensation is symmetric.  ROM of spine: ***full.  Palpation of spine: ***non tender.    Strength: Side Biceps Triceps Deltoid Interossei Grip Wrist Ext. Wrist Flex.  R 5 5 5 5 5 5 5   L 5 5 5 5 5 5 5    Side Iliopsoas Quads Hamstring PF DF EHL  R 5 5 5 5 5 5   L 5 5 5 5 5 5    Reflexes are ***2+ and symmetric at the biceps, triceps, brachioradialis, patella and achilles.   Hoffman's is absent.  Clonus is not present.  Toes are down-going.  Bilateral upper and lower extremity sensation is intact to light touch.    Gait is normal.   No difficulty with tandem gait.   No evidence of dysmetria noted.  Medical Decision Making  Imaging: ***  I have personally reviewed the images and agree with the above interpretation.  Assessment and Plan: [name] is a pleasant 82 y.o. female with ***    Thank you for involving me in the care of this patient.   I spent a total of *** minutes in both face-to-face and non-face-to-face activities for this visit on the date of this encounter.   [facility]

## 2023-07-09 ENCOUNTER — Ambulatory Visit: Payer: Medicare Other | Admitting: Neurosurgery

## 2023-07-10 ENCOUNTER — Encounter (INDEPENDENT_AMBULATORY_CARE_PROVIDER_SITE_OTHER): Payer: Self-pay

## 2023-07-27 ENCOUNTER — Other Ambulatory Visit: Payer: Self-pay | Admitting: Family Medicine

## 2023-07-31 ENCOUNTER — Telehealth (INDEPENDENT_AMBULATORY_CARE_PROVIDER_SITE_OTHER): Payer: Medicare Other | Admitting: Family Medicine

## 2023-07-31 ENCOUNTER — Encounter: Payer: Self-pay | Admitting: Family Medicine

## 2023-07-31 DIAGNOSIS — E119 Type 2 diabetes mellitus without complications: Secondary | ICD-10-CM | POA: Diagnosis not present

## 2023-07-31 DIAGNOSIS — Z7984 Long term (current) use of oral hypoglycemic drugs: Secondary | ICD-10-CM | POA: Diagnosis not present

## 2023-07-31 DIAGNOSIS — E785 Hyperlipidemia, unspecified: Secondary | ICD-10-CM

## 2023-07-31 NOTE — Assessment & Plan Note (Signed)
Chronic issue.  Continue Lipitor.  Lab work at visit in 3 months.

## 2023-07-31 NOTE — Telephone Encounter (Signed)
Please call the patient.  Risperidone is not a medication that I typically manage.  It looks like we have refilled it previously though this was filled by staff members.  Please contact the patient and see if she is seeing a psychiatrist as this medication should be managed by psychiatry.

## 2023-07-31 NOTE — Assessment & Plan Note (Signed)
Chronic issue.  Undetermined control.  Patient will start checking fasting sugars periodically.  She will continue metformin.  Follow-up in 3 months for labs as the patient declined coming in for lab work sooner.

## 2023-07-31 NOTE — Progress Notes (Signed)
Virtual Visit via telephone Note  I connected with Pamela Norman today at  2:00 PM EDT by telephone and verified that I am speaking with the correct person using two identifiers. Location patient: home Location provider: work  Persons participating in the virtual visit: patient, provider  I discussed the limitations, risks, security and privacy concerns of performing an evaluation and management service by telephone and the availability of in person appointments. I also discussed with the patient that there may be a patient responsible charge related to this service. The patient expressed understanding and agreed to proceed.  Interactive audio and video telecommunications were attempted between this provider and patient, however failed, due to patient having technical difficulties OR patient did not have access to video capability.  We continued and completed visit with audio only.   Reason for visit: f/u  HPI: Diabetes: Not checking sugars.  Taking metformin.  No polyuria or polydipsia.  No hypoglycemia.  Hyperlipidemia: Taking Lipitor.  No chest pain, claudication, rectal quadrant pain, or myalgias other than today.  Patient know she is not here in the office as she woke up from a nap and her body just was not feeling quite right.  She feels tight and sore.  She needs to get herself stretched out.  She opted not to drive into the visit.   ROS: See pertinent positives and negatives per HPI.  Past Medical History:  Diagnosis Date   Cataract    Cervical stenosis of spine    Endometrial cancer (HCC) 2016   Grade 1,total laparoscopic hysterectomy   History of colonoscopy 2014   within normal limits   History of ovarian cyst 01/2015   Hyperlipidemia    Rectal prolapse    Squamous cell carcinoma    face    Past Surgical History:  Procedure Laterality Date   ABDOMINAL HYSTERECTOMY     APPENDECTOMY     BILATERAL SALPINGOOPHORECTOMY  03/30/15   CATARACT EXTRACTION W/PHACO Right  02/06/2016   Procedure: CATARACT EXTRACTION PHACO AND INTRAOCULAR LENS PLACEMENT (IOC);  Surgeon: Sallee Lange, MD;  Location: [facility] ORS;  Service: Ophthalmology;  Laterality: Right;  [place] 01:39 AP% 24.9 CDE 47.32 fluid pack lot # [id number]   CATARACT EXTRACTION W/PHACO Left 03/05/2016   Procedure: CATARACT EXTRACTION PHACO AND INTRAOCULAR LENS PLACEMENT (IOC);  Surgeon: Sallee Lange, MD;  Location: [facility] ORS;  Service: Ophthalmology;  Laterality: Left;  [place] 01:02 AP% 23.2 CDE 26.73 fluid pack lot # [id number]   COMBINED HYSTEROSCOPY DIAGNOSTIC / D&C  2016   DILATION AND CURETTAGE OF UTERUS     dnc     LAPAROSCOPIC HYSTERECTOMY  03/30/15   NASAL SINUS SURGERY     SKIN CANCER EXCISION     UPPER LIP   TONSILECTOMY, ADENOIDECTOMY, BILATERAL MYRINGOTOMY AND TUBES     TONSILLECTOMY      Family History  Problem Relation Age of Onset   Colon cancer Father        older age onset   COPD Father    COPD Mother    Breast cancer Neg Hx    Ovarian cancer Neg Hx    Diabetes Neg Hx    Heart disease Neg Hx     SOCIAL HX: Former smoker   Current Outpatient Medications:    atorvastatin (LIPITOR) 80 MG tablet, Take 1 tablet (80 mg total) by mouth daily., Disp: 90 tablet, Rfl: 3   cholecalciferol (VITAMIN D) 1000 units tablet, Take 1,000 Units by mouth daily., Disp: , Rfl:  DULoxetine (CYMBALTA) 30 MG capsule, TAKE 1 CAPSULE(30 MG) BY MOUTH DAILY, Disp: 90 capsule, Rfl: 3   metFORMIN (GLUCOPHAGE) 500 MG tablet, TAKE 1 TABLET(500 MG) BY MOUTH DAILY WITH BREAKFAST, Disp: 90 tablet, Rfl: 1   Multiple Vitamin (MULTIVITAMIN) tablet, Take 1 tablet by mouth daily., Disp: , Rfl:    risperiDONE (RISPERDAL) 1 MG tablet, TAKE 1 TABLET(1 MG) BY MOUTH AT BEDTIME AS NEEDED, Disp: 90 tablet, Rfl: 0  EXAM:  VITALS per patient if applicable:  GENERAL: alert, oriented, appears well and in no acute distress  HEENT: atraumatic, conjunttiva clear, no obvious abnormalities on inspection of external nose and  ears  NECK: normal movements of the head and neck  LUNGS: on inspection no signs of respiratory distress, breathing rate appears normal, no obvious gross SOB, gasping or wheezing  CV: no obvious cyanosis  MS: moves all visible extremities without noticeable abnormality  PSYCH/NEURO: pleasant and cooperative, no obvious depression or anxiety, speech and thought processing grossly intact  ASSESSMENT AND PLAN:  Discussed the following assessment and plan:  Problem List Items Addressed This Visit     Diabetes mellitus without complication (HCC) - Primary (Chronic)    Chronic issue.  Undetermined control.  Patient will start checking fasting sugars periodically.  She will continue metformin.  Follow-up in 3 months for labs as the patient declined coming in for lab work sooner.      Hyperlipidemia (Chronic)    Chronic issue.  Continue Lipitor.  Lab work at visit in 3 months.     Patient will let us know if she is not able to get stretched out from her muscle tightness and soreness.    Return in about 3 months (around 10/31/2023).   I discussed the assessment and treatment plan with the patient. The patient was provided an opportunity to ask questions and all were answered. The patient agreed with the plan and demonstrated an understanding of the instructions.   The patient was advised to call back or seek an in-person evaluation if the symptoms worsen or if the condition fails to improve as anticipated.  I provided 6 minutes of non-face-to-face time during this encounter.   Marikay Alar, MD

## 2023-08-01 NOTE — Telephone Encounter (Signed)
Called Patient she states her Psychiatry office closed several months back and you took over the medication but she also states that she has the medication and does not need a refill.

## 2023-11-01 ENCOUNTER — Encounter: Payer: Self-pay | Admitting: Family Medicine

## 2023-11-01 ENCOUNTER — Ambulatory Visit (INDEPENDENT_AMBULATORY_CARE_PROVIDER_SITE_OTHER): Payer: Medicare Other | Admitting: Family Medicine

## 2023-11-01 VITALS — BP 116/74 | HR 92 | Temp 97.5°F | Ht 66.5 in | Wt 183.6 lb

## 2023-11-01 DIAGNOSIS — E785 Hyperlipidemia, unspecified: Secondary | ICD-10-CM

## 2023-11-01 DIAGNOSIS — E119 Type 2 diabetes mellitus without complications: Secondary | ICD-10-CM

## 2023-11-01 DIAGNOSIS — E559 Vitamin D deficiency, unspecified: Secondary | ICD-10-CM

## 2023-11-01 DIAGNOSIS — L989 Disorder of the skin and subcutaneous tissue, unspecified: Secondary | ICD-10-CM | POA: Diagnosis not present

## 2023-11-01 DIAGNOSIS — Z7984 Long term (current) use of oral hypoglycemic drugs: Secondary | ICD-10-CM | POA: Diagnosis not present

## 2023-11-01 NOTE — Assessment & Plan Note (Signed)
Refer to dermatology for skin exam.

## 2023-11-01 NOTE — Progress Notes (Signed)
Marikay Alar, MD Phone: [phone]  Pamela Norman is a 82 y.o. female who presents today for follow-up.  Diabetes: Not checking sugars.  She is taking metformin.  No polyuria or polydipsia.  No hypoglycemia.  Hyperlipidemia: Taking Lipitor.  No chest pain, claudication, right upper quadrant pain, or myalgias.  Skin lesions: Patient notes she was previously referred to dermatology for skin evaluation though she never ended up seeing them.  She would like another referral to dermatology.  Social History   Tobacco Use  Smoking Status Former   Current packs/day: 0.00   Average packs/day: 0.3 packs/day for 20.0 years (5.0 ttl pk-yrs)   Types: Cigarettes   Start date: 04/13/1961   Quit date: 04/13/1981   Years since quitting: 42.5  Smokeless Tobacco Never    Current Outpatient Medications on File Prior to Visit  Medication Sig Dispense Refill   atorvastatin (LIPITOR) 80 MG tablet Take 1 tablet (80 mg total) by mouth daily. 90 tablet 3   cholecalciferol (VITAMIN D) 1000 units tablet Take 1,000 Units by mouth daily.     DULoxetine (CYMBALTA) 30 MG capsule TAKE 1 CAPSULE(30 MG) BY MOUTH DAILY 90 capsule 3   metFORMIN (GLUCOPHAGE) 500 MG tablet TAKE 1 TABLET(500 MG) BY MOUTH DAILY WITH BREAKFAST 90 tablet 1   Multiple Vitamin (MULTIVITAMIN) tablet Take 1 tablet by mouth daily.     risperiDONE (RISPERDAL) 1 MG tablet TAKE 1 TABLET(1 MG) BY MOUTH AT BEDTIME AS NEEDED 90 tablet 0   No current facility-administered medications on file prior to visit.     ROS see history of present illness  Objective  Physical Exam Vitals:   11/01/23 1114  BP: 116/74  Pulse: 92  Temp: (!) 97.5 F (36.4 C)  SpO2: 96%    BP Readings from Last 3 Encounters:  11/01/23 116/74  02/22/23 126/80  01/30/23 124/82   Wt Readings from Last 3 Encounters:  11/01/23 183 lb 9.6 oz (83.3 kg)  02/22/23 175 lb 6.4 oz (79.6 kg)  01/30/23 175 lb (79.4 kg)    Physical Exam Constitutional:      General:  She is not in acute distress.    Appearance: She is not diaphoretic.  Cardiovascular:     Rate and Rhythm: Normal rate and regular rhythm.     Heart sounds: Normal heart sounds.  Pulmonary:     Effort: Pulmonary effort is normal.     Breath sounds: Normal breath sounds.  Skin:    General: Skin is warm and dry.  Neurological:     Mental Status: She is alert.    Diabetic Foot Exam - Simple   Simple Foot Form Diabetic Foot exam was performed with the following findings: Yes 11/01/2023 11:35 AM  Visual Inspection No deformities, no ulcerations, no other skin breakdown bilaterally: Yes Sensation Testing Intact to touch and monofilament testing bilaterally: Yes Pulse Check Posterior Tibialis and Dorsalis pulse intact bilaterally: Yes Comments      Assessment/Plan: Please see individual problem list.  Diabetes mellitus without complication (HCC) Assessment & Plan: Chronic issue.  Undetermined control.  She will return for fasting labs.  She will continue metformin 500 mg daily.  Orders: -     Hemoglobin A1c; Future -     Microalbumin / creatinine urine ratio; Future -     Comprehensive metabolic panel; Future  Hyperlipidemia, unspecified hyperlipidemia type Assessment & Plan: Chronic issue.  She will return for fasting labs.  She will continue Lipitor 80 mg daily.  Orders: -  Comprehensive metabolic panel; Future -     Lipid panel; Future  Vitamin D deficiency Assessment & Plan: Check vitamin D.  Orders: -     VITAMIN D 25 Hydroxy (Vit-D Deficiency, Fractures); Future  Skin lesions Assessment & Plan: Refer to dermatology for skin exam.  Orders: -     Ambulatory referral to Dermatology     Return in about 1 week (around 11/08/2023) for labs, transfer of care with dr Clent Ridges in 3 months.   Marikay Alar, MD [facility]

## 2023-11-01 NOTE — Assessment & Plan Note (Signed)
Check vitamin D. 

## 2023-11-01 NOTE — Assessment & Plan Note (Signed)
Chronic issue.  Undetermined control.  She will return for fasting labs.  She will continue metformin 500 mg daily.

## 2023-11-01 NOTE — Assessment & Plan Note (Signed)
Chronic issue.  She will return for fasting labs.  She will continue Lipitor 80 mg daily.

## 2023-11-06 ENCOUNTER — Other Ambulatory Visit (INDEPENDENT_AMBULATORY_CARE_PROVIDER_SITE_OTHER): Payer: Medicare Other

## 2023-11-06 DIAGNOSIS — E785 Hyperlipidemia, unspecified: Secondary | ICD-10-CM

## 2023-11-06 DIAGNOSIS — E559 Vitamin D deficiency, unspecified: Secondary | ICD-10-CM | POA: Diagnosis not present

## 2023-11-06 DIAGNOSIS — E119 Type 2 diabetes mellitus without complications: Secondary | ICD-10-CM | POA: Diagnosis not present

## 2023-11-06 LAB — COMPREHENSIVE METABOLIC PANEL
ALT: 16 U/L (ref 0–35)
AST: 15 U/L (ref 0–37)
Albumin: 4.3 g/dL (ref 3.5–5.2)
Alkaline Phosphatase: 84 U/L (ref 39–117)
BUN: 17 mg/dL (ref 6–23)
CO2: 29 meq/L (ref 19–32)
Calcium: 9.6 mg/dL (ref 8.4–10.5)
Chloride: 106 meq/L (ref 96–112)
Creatinine, Ser: 0.88 mg/dL (ref 0.40–1.20)
GFR: 61.48 mL/min (ref >60.00)
Glucose, Bld: 153 mg/dL — ABNORMAL HIGH (ref 70–99)
Potassium: 4 meq/L (ref 3.5–5.1)
Sodium: 143 meq/L (ref 135–145)
Total Bilirubin: 0.7 mg/dL (ref 0.2–1.2)
Total Protein: 7.1 g/dL (ref 6.0–8.3)

## 2023-11-06 LAB — LIPID PANEL
Cholesterol: 170 mg/dL (ref 0–200)
HDL: 36.1 mg/dL — ABNORMAL LOW (ref >39.00)
LDL Cholesterol: 96 mg/dL (ref 0–99)
NonHDL: 133.91
Total CHOL/HDL Ratio: 5
Triglycerides: 190 mg/dL — ABNORMAL HIGH (ref 0.0–149.0)
VLDL: 38 mg/dL (ref 0.0–40.0)

## 2023-11-06 LAB — MICROALBUMIN / CREATININE URINE RATIO
Creatinine,U: 71.1 mg/dL
Microalb Creat Ratio: 2.7 mg/g (ref 0.0–30.0)
Microalb, Ur: 1.9 mg/dL (ref 0.0–1.9)

## 2023-11-06 LAB — VITAMIN D 25 HYDROXY (VIT D DEFICIENCY, FRACTURES): VITD: 35.8 ng/mL (ref 30.00–100.00)

## 2023-11-06 LAB — HEMOGLOBIN A1C: Hgb A1c MFr Bld: 6.8 % — ABNORMAL HIGH (ref 4.6–6.5)

## 2023-11-11 ENCOUNTER — Telehealth: Payer: Self-pay

## 2023-11-11 NOTE — Telephone Encounter (Signed)
-----   Message from Marikay Alar sent at 11/11/2023 11:41 AM EST ----- Please let the patient know that her A1c is slightly worse at 6.8.  This remains adequately controlled.  Cholesterol is a little worse than it was 8 months ago.  She needs to maintain a healthy diet and activity level and remain on her statin.  Her other labs are acceptable.

## 2023-11-11 NOTE — Telephone Encounter (Signed)
Left message to call the office back.

## 2023-11-15 DIAGNOSIS — Z23 Encounter for immunization: Secondary | ICD-10-CM | POA: Diagnosis not present

## 2024-01-06 DIAGNOSIS — H16223 Keratoconjunctivitis sicca, not specified as Sjogren's, bilateral: Secondary | ICD-10-CM | POA: Diagnosis not present

## 2024-01-06 DIAGNOSIS — Z961 Presence of intraocular lens: Secondary | ICD-10-CM | POA: Diagnosis not present

## 2024-01-06 DIAGNOSIS — H179 Unspecified corneal scar and opacity: Secondary | ICD-10-CM | POA: Diagnosis not present

## 2024-01-06 LAB — HM DIABETES EYE EXAM

## 2024-01-07 ENCOUNTER — Other Ambulatory Visit: Payer: Self-pay | Admitting: Family Medicine

## 2024-02-05 ENCOUNTER — Encounter: Payer: Medicare Other | Admitting: Family Medicine

## 2024-02-17 ENCOUNTER — Encounter: Payer: Self-pay | Admitting: Dermatology

## 2024-02-17 ENCOUNTER — Ambulatory Visit: Payer: Medicare Other | Admitting: Dermatology

## 2024-02-17 DIAGNOSIS — C44519 Basal cell carcinoma of skin of other part of trunk: Secondary | ICD-10-CM | POA: Diagnosis not present

## 2024-02-17 DIAGNOSIS — K623 Rectal prolapse: Secondary | ICD-10-CM | POA: Insufficient documentation

## 2024-02-17 DIAGNOSIS — C44511 Basal cell carcinoma of skin of breast: Secondary | ICD-10-CM | POA: Diagnosis not present

## 2024-02-17 DIAGNOSIS — D492 Neoplasm of unspecified behavior of bone, soft tissue, and skin: Secondary | ICD-10-CM | POA: Diagnosis not present

## 2024-02-17 DIAGNOSIS — L821 Other seborrheic keratosis: Secondary | ICD-10-CM

## 2024-02-17 DIAGNOSIS — C4491 Basal cell carcinoma of skin, unspecified: Secondary | ICD-10-CM

## 2024-02-17 DIAGNOSIS — L820 Inflamed seborrheic keratosis: Secondary | ICD-10-CM

## 2024-02-17 HISTORY — DX: Basal cell carcinoma of skin, unspecified: C44.91

## 2024-02-17 NOTE — Progress Notes (Signed)
   New Patient Visit   Subjective  Pamela Norman is a 83 y.o. female who presents for the following: Spots. Left back at shoulder. Larger than other moles. Lesion below left breast has been there for 10 years. Rubbed by clothing. Spot on abdomen where lesion has come off in the past.   The patient has spots, moles and lesions to be evaluated, some may be new or changing and the patient may have concern these could be cancer.    The following portions of the chart were reviewed this encounter and updated as appropriate: medications, allergies, medical history  Review of Systems:  No other skin or systemic complaints except as noted in HPI or Assessment and Plan.  Objective  Well appearing patient in no apparent distress; mood and affect are within normal limits.  A focused examination was performed of the following areas: Left upper back, right abdomen, left inframammary  Relevant physical exam findings are noted in the Assessment and Plan.  Left Inframammary 11 mm pink plaque with telangiectasias  Left inferior breast x1 Erythematous keratotic or waxy stuck-on papule or plaque.  Assessment & Plan   NEOPLASM OF SKIN Left Inframammary Skin / nail biopsy Type of biopsy: tangential   Informed consent: discussed and consent obtained   Timeout: patient name, date of birth, surgical site, and procedure verified   Procedure prep:  Patient was prepped and draped in usual sterile fashion Prep type:  Isopropyl alcohol Anesthesia: the lesion was anesthetized in a standard fashion   Anesthetic:  1% lidocaine w/ epinephrine 1-100,000 buffered w/ 8.4% NaHCO3 Instrument used: DermaBlade   Hemostasis achieved with: pressure and aluminum chloride   Outcome: patient tolerated procedure well   Post-procedure details: sterile dressing applied and wound care instructions given   Dressing type: bandage and petrolatum   Specimen 1 - Surgical pathology Differential Diagnosis: BCC vs SCC vs  irritant  Check Margins: No INFLAMED SEBORRHEIC KERATOSIS Left inferior breast x1 Symptomatic, irritating, patient would like treated.  DO NOT BILL, per Dr. Katrinka Blazing, frozen as test site. Destruction of lesion - Left inferior breast x1 Complexity: simple   Destruction method: cryotherapy   Informed consent: discussed and consent obtained   Timeout:  patient name, date of birth, surgical site, and procedure verified Lesion destroyed using liquid nitrogen: Yes   Region frozen until ice ball extended beyond lesion: Yes   Cryo cycles: 1 or 2. Outcome: patient tolerated procedure well with no complications   Post-procedure details: wound care instructions given   Additional details:  Prior to procedure, discussed risks of blister formation, small wound, skin dyspigmentation, or rare scar following cryotherapy. Recommend Vaseline ointment to treated areas while healing.  SEBORRHEIC KERATOSES    SEBORRHEIC KERATOSIS - Stuck-on, waxy, tan-brown papules and/or plaques. Left back at scapula, right lower abdomen - Benign-appearing - Discussed benign etiology and prognosis. - Observe - Call for any changes     Return for Follow up pending pathology results. Pamela Norman, CMA, am acting as scribe for Elie Goody, MD.   Documentation: I have reviewed the above documentation for accuracy and completeness, and I agree with the above.  Elie Goody, MD

## 2024-02-17 NOTE — Patient Instructions (Addendum)
Cryotherapy Aftercare  Wash gently with soap and water everyday.   Apply Vaseline and Band-Aid daily until healed.    Wound Care Instructions  Cleanse wound gently with soap and water once a day then pat dry with clean gauze. Apply a thin coat of Petrolatum (petroleum jelly, "Vaseline") over the wound (unless you have an allergy to this). We recommend that you use a new, sterile tube of Vaseline. Do not pick or remove scabs. Do not remove the yellow or white "healing tissue" from the base of the wound.  Cover the wound with fresh, clean, nonstick gauze and secure with paper tape. You may use Band-Aids in place of gauze and tape if the wound is small enough, but would recommend trimming much of the tape off as there is often too much. Sometimes Band-Aids can irritate the skin.  You should call the office for your biopsy report after 1 week if you have not already been contacted.  If you experience any problems, such as abnormal amounts of bleeding, swelling, significant bruising, significant pain, or evidence of infection, please call the office immediately.  FOR ADULT SURGERY PATIENTS: If you need something for pain relief you may take 1 extra strength Tylenol (acetaminophen) AND 2 Ibuprofen ('200mg'$  each) together every 4 hours as needed for pain. (do not take these if you are allergic to them or if you have a reason you should not take them.) Typically, you may only need pain medication for 1 to 3 days.

## 2024-02-19 LAB — SURGICAL PATHOLOGY

## 2024-02-20 ENCOUNTER — Telehealth: Payer: Self-pay

## 2024-02-20 NOTE — Telephone Encounter (Signed)
-----   Message from [place] sent at 02/19/2024  5:16 PM EDT ----- Diagnosis: left inframammary :       BASAL CELL CARCINOMA, NODULAR PATTERN    Please call to share diagnosis and discuss treatment options.  Explanation: your biopsy shows basal cell skin cancers in the second layer of the skin. This is the most common kind of skin cancer and is caused by damage from sun exposure. Basal cell skin cancers almost never spread beyond the skin, so they are not dangerous to your overall health. However, they will continue to grow, can bleed, cause nonhealing wounds, and disrupt nearby structures unless fully treated.   Treatment: Excision - you return for an hour long appointment in our clinic where we perform a skin surgery. We numb the site of the skin cancer and a safety margin of normal skin around it. We remove the full thickness of skin and close the wound with two layers of stitches. The sample is sent to the lab to check that the skin cancer was fully removed. Return one week later to have wound checked and surface stitches removed. Surgical wound leaves a line scar. Approximately 95% cure rate. Risk of recurrence, bleeding, infection, pain, injury to nearby structures, hypertrophic scar.

## 2024-02-20 NOTE — Telephone Encounter (Signed)
 Called patient at both numbers listed in her chart. Unable to leave message at home number and cell is no longer in service. Sent MyChart message for her to call our office to discuss biopsy results and treatment plan.

## 2024-02-20 NOTE — Telephone Encounter (Signed)
 Discussed pathology results and surgery scheduled.

## 2024-02-21 ENCOUNTER — Other Ambulatory Visit: Payer: Self-pay | Admitting: Family Medicine

## 2024-02-21 DIAGNOSIS — E119 Type 2 diabetes mellitus without complications: Secondary | ICD-10-CM

## 2024-02-21 NOTE — Telephone Encounter (Signed)
 Last Fill: 07/06/22  Last OV: 11/06/23 Next OV: 04/17/24  Routing to provider for review/authorization.

## 2024-02-21 NOTE — Telephone Encounter (Signed)
 Copied from CRM [id number]. Topic: Clinical - Medication Refill >> Feb 21, 2024  1:53 PM Isabell A wrote: Most Recent Primary Care Visit:  Provider: [facility]  Department: [facility]  Visit Type: LAB  Date: 11/06/2023  Medication: metFORMIN (GLUCOPHAGE) 500 MG tablet   Has the patient contacted their pharmacy? Yes (Agent: If no, request that the patient contact the pharmacy for the refill. If patient does not wish to contact the pharmacy document the reason why and proceed with request.) (Agent: If yes, when and what did the pharmacy advise?)  Is this the correct pharmacy for this prescription? Yes If no, delete pharmacy and type the correct one.  This is the patient's preferred pharmacy:  [id number] - [place], [place] - [address] AT [facility] OF [address] [place] [address] [place] [address] Phone: [phone] Fax: [phone]   Has the prescription been filled recently? Yes  Is the patient out of the medication? Yes  Has the patient been seen for an appointment in the last year OR does the patient have an upcoming appointment? Yes  Can we respond through MyChart? No  Agent: Please be advised that Rx refills may take up to 3 business days. We ask that you follow-up with your pharmacy.

## 2024-02-24 ENCOUNTER — Other Ambulatory Visit: Payer: Self-pay

## 2024-02-24 DIAGNOSIS — E119 Type 2 diabetes mellitus without complications: Secondary | ICD-10-CM

## 2024-02-24 MED ORDER — METFORMIN HCL 500 MG PO TABS
ORAL_TABLET | ORAL | 0 refills | Status: DC
Start: 2024-02-24 — End: 2024-05-15

## 2024-02-25 MED ORDER — METFORMIN HCL 500 MG PO TABS
ORAL_TABLET | ORAL | 0 refills | Status: AC
Start: 2024-02-25 — End: ?

## 2024-02-28 ENCOUNTER — Telehealth: Payer: Self-pay | Admitting: Family Medicine

## 2024-02-28 NOTE — Telephone Encounter (Signed)
 Dr Clent Ridges is leaving the practice and your New Patient or Transfer of Care appointment needs to be rescheduled with another provider. Please call the office to schedule a Transfer of Care to either Dr Charlann Lange, Darleen Crocker or Kara Dies, NP.   Thank you  E2C2, please reschedule this patient's TOC visit. [facility]

## 2024-03-11 ENCOUNTER — Encounter: Admitting: Dermatology

## 2024-03-24 ENCOUNTER — Telehealth: Payer: Self-pay | Admitting: Obstetrics and Gynecology

## 2024-03-24 NOTE — Telephone Encounter (Signed)
 Reached out to pt about an appt (annual) that was scheduled with Dr. Denman Fischer on 04/21/2024, but on the [facility] schedule.  Going to get pt scheduled with Dr. Dell Fennel for her annual on 5/13/205.  Left message for pt to call back.

## 2024-03-24 NOTE — Telephone Encounter (Signed)
 Patient is scheduled to see Dr Dell Fennel on 04/21/24 at 1:15

## 2024-04-01 ENCOUNTER — Encounter: Admitting: Dermatology

## 2024-04-21 ENCOUNTER — Encounter: Payer: Self-pay | Admitting: Obstetrics

## 2024-04-21 ENCOUNTER — Ambulatory Visit (INDEPENDENT_AMBULATORY_CARE_PROVIDER_SITE_OTHER): Admitting: Obstetrics

## 2024-04-21 ENCOUNTER — Encounter: Payer: TRICARE For Life (TFL) | Admitting: Obstetrics and Gynecology

## 2024-04-21 VITALS — BP 140/80 | Ht 66.5 in | Wt 190.0 lb

## 2024-04-21 DIAGNOSIS — Z8542 Personal history of malignant neoplasm of other parts of uterus: Secondary | ICD-10-CM

## 2024-04-21 DIAGNOSIS — Z1231 Encounter for screening mammogram for malignant neoplasm of breast: Secondary | ICD-10-CM

## 2024-04-21 NOTE — Progress Notes (Signed)
 GYNECOLOGY PROGRESS NOTE  SUBJECTIVE:       Pamela Norman is a 83 y.o. G0P0000 female here for surveillance of endometrial cancer. She was diagnosed with Stage IA grade 1 endometrioid adenocarcinoma in 2016. She Underwent TLH, BSO, pelvic lymph node mapping. She also completed 6 treatments of vaginal brachytherapy since final path showed LVSI (completed) 09/20/2015. She has been followed by the GYN Oncology at [facility], with last visit in Jan '21; was alternating with GYN, last visit May '23 with Dr. Denman Fischer.     Gynecologic History No LMP recorded. Patient has had a hysterectomy. Contraception: post menopausal status Last mammogram: 04/09/23. Results were: : Incomplete: Need additional imaging evaluation.  Last Colonoscopy:  2019 with Dr. Peg Bouton. Advised that she did not require any further screening.    Last Dexa Scan: 2019 consistent with osteopenia, followed by PCP   Obstetric History OB History  Gravida Para Term Preterm AB Living  0 0 0 0 0 0  SAB IAB Ectopic Multiple Live Births  0 0 0 0   Obstetric Comments  Age at first menarche-age [age]    Past Medical History:  Diagnosis Date   Basal cell carcinoma 02/17/2024   Left inframammry. Nodular. Excision pending.   Cataract    Cervical stenosis of spine    Endometrial cancer (HCC) 2016   Grade 1,total laparoscopic hysterectomy   H/O: CVA (cerebrovascular accident) 08/02/2015   History of colonoscopy 2014   within normal limits   History of ovarian cyst 01/2015   Hyperlipidemia    Rectal prolapse    Squamous cell carcinoma    face    Family History  Problem Relation Age of Onset   Colon cancer Father        older age onset   COPD Father    COPD Mother    Breast cancer Neg Hx    Ovarian cancer Neg Hx    Diabetes Neg Hx    Heart disease Neg Hx     Past Surgical History:  Procedure Laterality Date   ABDOMINAL HYSTERECTOMY     APPENDECTOMY     BILATERAL SALPINGOOPHORECTOMY  03/30/15   CATARACT  EXTRACTION W/PHACO Right 02/06/2016   Procedure: CATARACT EXTRACTION PHACO AND INTRAOCULAR LENS PLACEMENT (IOC);  Surgeon: Steven Dingeldein, MD;  Location: [facility] ORS;  Service: Ophthalmology;  Laterality: Right;  US  01:39 AP% 24.9 CDE 47.32 fluid pack lot # [id number]   CATARACT EXTRACTION W/PHACO Left 03/05/2016   Procedure: CATARACT EXTRACTION PHACO AND INTRAOCULAR LENS PLACEMENT (IOC);  Surgeon: Steven Dingeldein, MD;  Location: [facility] ORS;  Service: Ophthalmology;  Laterality: Left;  US  01:02 AP% 23.2 CDE 26.73 fluid pack lot # [id number]   COMBINED HYSTEROSCOPY DIAGNOSTIC / D&C  2016   DILATION AND CURETTAGE OF UTERUS     dnc     LAPAROSCOPIC HYSTERECTOMY  03/30/15   NASAL SINUS SURGERY     SKIN CANCER EXCISION     UPPER LIP   TONSILECTOMY, ADENOIDECTOMY, BILATERAL MYRINGOTOMY AND TUBES     TONSILLECTOMY      Social History   Socioeconomic History   Marital status: Single    Spouse name: Not on file   Number of children: 0   Years of education: PhD   Highest education level: Doctorate  Occupational History   Occupation: Retired  Tobacco Use   Smoking status: Former    Current packs/day: 0.00    Average packs/day: 0.3 packs/day for 20.0 years (5.0 ttl pk-yrs)  Types: Cigarettes    Start date: 04/13/1961    Quit date: 04/13/1981    Years since quitting: 43.0   Smokeless tobacco: Never  Vaping Use   Vaping status: Never Used  Substance and Sexual Activity   Alcohol use: No   Drug use: No   Sexual activity: Not Currently  Other Topics Concern   Not on file  Social History Narrative   Lives in [place]. No children.      Retired [employer]      Diet - regular diet, herbalife      Exercise - none regular, [employer]      Right-handed.      1-2 cups caffeine daily.   Social Drivers of Corporate investment banker Strain: Low Risk  (10/29/2023)   Overall Financial Resource Strain (CARDIA)    Difficulty of Paying Living Expenses: Not hard at  all  Food Insecurity: No Food Insecurity (10/29/2023)   Hunger Vital Sign    Worried About Running Out of Food in the Last Year: Never true    Ran Out of Food in the Last Year: Never true  Transportation Needs: No Transportation Needs (10/29/2023)   PRAPARE - Administrator, Civil Service (Medical): No    Lack of Transportation (Non-Medical): No  Physical Activity: Insufficiently Active (10/29/2023)   Exercise Vital Sign    Days of Exercise per Week: 3 days    Minutes of Exercise per Session: 20 min  Stress: No Stress Concern Present (10/29/2023)   [employer] of Occupational Health - Occupational Stress Questionnaire    Feeling of Stress : Not at all  Social Connections: Moderately Integrated (10/29/2023)   Social Connection and Isolation Panel [NHANES]    Frequency of Communication with Friends and Family: More than three times a week    Frequency of Social Gatherings with Friends and Family: More than three times a week    Attends Religious Services: More than 4 times per year    Active Member of [employer] or Organizations: Yes    Attends Banker Meetings: 1 to 4 times per year    Marital Status: Never married  Intimate Partner Violence: Not At Risk (05/22/2022)   Humiliation, Afraid, Rape, and Kick questionnaire    Fear of Current or Ex-Partner: No    Emotionally Abused: No    Physically Abused: No    Sexually Abused: No    Current Outpatient Medications on File Prior to Visit  Medication Sig Dispense Refill   atorvastatin  (LIPITOR) 80 MG tablet Take 1 tablet (80 mg total) by mouth daily. 90 tablet 3   cholecalciferol (VITAMIN D ) 1000 units tablet Take 1,000 Units by mouth daily.     DULoxetine  (CYMBALTA ) 30 MG capsule TAKE 1 CAPSULE(30 MG) BY MOUTH DAILY 90 capsule 0   metFORMIN  (GLUCOPHAGE ) 500 MG tablet TAKE 1 TABLET(500 MG) BY MOUTH DAILY WITH BREAKFAST 90 tablet 0   metFORMIN  (GLUCOPHAGE ) 500 MG tablet TAKE 1 TABLET(500 MG) BY MOUTH DAILY WITH  BREAKFAST 90 tablet 0   Multiple Vitamin (MULTIVITAMIN) tablet Take 1 tablet by mouth daily.     risperiDONE  (RISPERDAL ) 1 MG tablet TAKE 1 TABLET(1 MG) BY MOUTH AT BEDTIME AS NEEDED 90 tablet 0   No current facility-administered medications on file prior to visit.    No Known Allergies    Review of Systems ROS Review of Systems - General ROS: negative for - chills, fatigue, fever, hot flashes, night sweats, weight gain  or weight loss Psychological ROS: negative for - anxiety, decreased libido, depression, mood swings, physical abuse or sexual abuse Ophthalmic ROS: negative for - blurry vision, eye pain or loss of vision ENT ROS: negative for - headaches, hearing change, visual changes or vocal changes Allergy and Immunology ROS: negative for - hives, itchy/watery eyes or seasonal allergies Hematological and Lymphatic ROS: negative for - bleeding problems, bruising, swollen lymph nodes or weight loss Endocrine ROS: negative for - galactorrhea, hair pattern changes, hot flashes, malaise/lethargy, mood swings, palpitations, polydipsia/polyuria, skin changes, temperature intolerance or unexpected weight changes Breast ROS: negative for - new or changing breast lumps or nipple discharge Respiratory ROS: negative for - cough or shortness of breath Cardiovascular ROS: negative for - chest pain, irregular heartbeat, palpitations or shortness of breath Gastrointestinal ROS: no abdominal pain, change in bowel habits, or black or bloody stools Genito-Urinary ROS: no dysuria, trouble voiding, or hematuria Musculoskeletal ROS: negative for - joint pain or joint stiffness Neurological ROS: negative for - bowel and bladder control changes Dermatological ROS: negative for rash and skin lesion changes   OBJECTIVE:   BP (!) 140/80   Ht 5' 6.5" (1.689 m)   Wt 190 lb (86.2 kg)   BMI 30.21 kg/m  General appearance: alert and no distress Breast: no masses, no skin changes or dimpling, no nipple  discharge. Abdomen: soft, non-tender; bowel sounds normal; no masses,  no organomegaly Pelvic: external genitalia normal, rectovaginal septum normal.  Vagina without discharge. Moderate atrophy present. Uterus and cervix surgically absent. Adnexae surgically absent.  Extremities: extremities normal, atraumatic, no cyanosis or edema Neurologic: Grossly normal  Lymph nodes: Cervical, supraclavicular, and axillary nodes normal.   Labs: Lab Results  Component Value Date   WBC 8.0 01/28/2021   HGB 13.8 01/28/2021   HCT 41.9 01/28/2021   MCV 92.1 01/28/2021   PLT 239 01/28/2021    Lab Results  Component Value Date   CREATININE 0.88 11/06/2023   BUN 17 11/06/2023   NA 143 11/06/2023   K 4.0 11/06/2023   CL 106 11/06/2023   CO2 29 11/06/2023    Lab Results  Component Value Date   ALT 16 11/06/2023   AST 15 11/06/2023   ALKPHOS 84 11/06/2023   BILITOT 0.7 11/06/2023    Lab Results  Component Value Date   CHOL 170 11/06/2023   HDL 36.10 (L) 11/06/2023   LDLCALC 96 11/06/2023   LDLDIRECT 82.0 09/23/2020   TRIG 190.0 (H) 11/06/2023   CHOLHDL 5 11/06/2023    Lab Results  Component Value Date   TSH 5.64 (H) 10/29/2022    Lab Results  Component Value Date   HGBA1C 6.8 (H) 11/06/2023     ASSESSMENT:   1. History of endometrial cancer   2. Breast cancer screening by mammogram      PLAN:   -Hx of endometrial cancer: doing well, no new symptoms or complaints; NED on exam. Completed oncology follow up in Jan '21 and can resume general GYN care every other year.  -Breast cancer screening: discussed USPSTF recommendation beyond age 34yo and pt desires to continue screening, would treat aggressively.   Follow up Q27yrs, sooner prn.    Sofia Dunn, MD [facility]

## 2024-04-21 NOTE — Patient Instructions (Signed)
 Preventive Care 43 Years and Older, Female Preventive care refers to lifestyle choices and visits with your health care provider that can promote health and wellness. Preventive care visits are also called wellness exams. What can I expect for my preventive care visit? Counseling Your health care provider may ask you questions about your: Medical history, including: Past medical problems. Family medical history. Pregnancy and menstrual history. History of falls. Current health, including: Memory and ability to understand (cognition). Emotional well-being. Home life and relationship well-being. Sexual activity and sexual health. Lifestyle, including: Alcohol, nicotine or tobacco, and drug use. Access to firearms. Diet, exercise, and sleep habits. Work and work Astronomer. Sunscreen use. Safety issues such as seatbelt and bike helmet use. Physical exam Your health care provider will check your: Height and weight. These may be used to calculate your BMI (body mass index). BMI is a measurement that tells if you are at a healthy weight. Waist circumference. This measures the distance around your waistline. This measurement also tells if you are at a healthy weight and may help predict your risk of certain diseases, such as type 2 diabetes and high blood pressure. Heart rate and blood pressure. Body temperature. Skin for abnormal spots. What immunizations do I need?  Vaccines are usually given at various ages, according to a schedule. Your health care provider will recommend vaccines for you based on your age, medical history, and lifestyle or other factors, such as travel or where you work. What tests do I need? Screening Your health care provider may recommend screening tests for certain conditions. This may include: Lipid and cholesterol levels. Hepatitis C test. Hepatitis B test. HIV (human immunodeficiency virus) test. STI (sexually transmitted infection) testing, if you are at  risk. Lung cancer screening. Colorectal cancer screening. Diabetes screening. This is done by checking your blood sugar (glucose) after you have not eaten for a while (fasting). Mammogram. Talk with your health care provider about how often you should have regular mammograms. BRCA-related cancer screening. This may be done if you have a family history of breast, ovarian, tubal, or peritoneal cancers. Bone density scan. This is done to screen for osteoporosis. Talk with your health care provider about your test results, treatment options, and if necessary, the need for more tests. Follow these instructions at home: Eating and drinking  Eat a diet that includes fresh fruits and vegetables, whole grains, lean protein, and low-fat dairy products. Limit your intake of foods with high amounts of sugar, saturated fats, and salt. Take vitamin and mineral supplements as recommended by your health care provider. Do not drink alcohol if your health care provider tells you not to drink. If you drink alcohol: Limit how much you have to 0-1 drink a day. Know how much alcohol is in your drink. In the [place] one drink equals one 12 oz bottle of beer (355 mL), one 5 oz glass of wine (148 mL), or one 1 oz glass of hard liquor (44 mL). Lifestyle Brush your teeth every morning and night with fluoride toothpaste. Floss one time each day. Exercise for at least 30 minutes 5 or more days each week. Do not use any products that contain nicotine or tobacco. These products include cigarettes, chewing tobacco, and vaping devices, such as e-cigarettes. If you need help quitting, ask your health care provider. Do not use drugs. If you are sexually active, practice safe sex. Use a condom or other form of protection in order to prevent STIs. Take aspirin only as told by  your health care provider. Make sure that you understand how much to take and what form to take. Work with your health care provider to find out whether it  is safe and beneficial for you to take aspirin daily. Ask your health care provider if you need to take a cholesterol-lowering medicine (statin). Find healthy ways to manage stress, such as: Meditation, yoga, or listening to music. Journaling. Talking to a trusted person. Spending time with friends and family. Minimize exposure to UV radiation to reduce your risk of skin cancer. Safety Always wear your seat belt while driving or riding in a vehicle. Do not drive: If you have been drinking alcohol. Do not ride with someone who has been drinking. When you are tired or distracted. While texting. If you have been using any mind-altering substances or drugs. Wear a helmet and other protective equipment during sports activities. If you have firearms in your house, make sure you follow all gun safety procedures. What's next? Visit your health care provider once a year for an annual wellness visit. Ask your health care provider how often you should have your eyes and teeth checked. Stay up to date on all vaccines. This information is not intended to replace advice given to you by your health care provider. Make sure you discuss any questions you have with your health care provider. Document Revised: 05/24/2021 Document Reviewed: 05/24/2021 Elsevier Patient Education  2024 [employer].

## 2024-04-22 ENCOUNTER — Encounter: Payer: Self-pay | Admitting: Dermatology

## 2024-04-22 ENCOUNTER — Ambulatory Visit: Admitting: Dermatology

## 2024-04-22 DIAGNOSIS — C44519 Basal cell carcinoma of skin of other part of trunk: Secondary | ICD-10-CM

## 2024-04-22 DIAGNOSIS — L988 Other specified disorders of the skin and subcutaneous tissue: Secondary | ICD-10-CM | POA: Diagnosis not present

## 2024-04-22 MED ORDER — MUPIROCIN 2 % EX OINT
1.0000 | TOPICAL_OINTMENT | Freq: Every day | CUTANEOUS | 0 refills | Status: DC
Start: 2024-04-22 — End: 2024-04-22

## 2024-04-22 NOTE — Progress Notes (Signed)
   Follow-Up Visit   Subjective  Pamela Norman is a 83 y.o. female who presents for the following: excision of  bx proven BCC L inframammary The patient has spots, moles and lesions to be evaluated, some may be new or changing and the patient may have concern these could be cancer.   The following portions of the chart were reviewed this encounter and updated as appropriate: medications, allergies, medical history  Review of Systems:  No other skin or systemic complaints except as noted in HPI or Assessment and Plan.  Objective  Well appearing patient in no apparent distress; mood and affect are within normal limits.   A focused examination was performed of the following areas: abdomen  Relevant exam findings are noted in the Assessment and Plan.  L inframammary Pink bx site 1.8  Assessment & Plan   BASAL CELL CARCINOMA (BCC) OF SKIN OF OTHER PART OF TORSO L inframammary Skin excision  Excision method:  elliptical Lesion length (cm):  1 Margin per side (cm):  0.4 Total excision diameter (cm):  1.8 Informed consent: discussed and consent obtained   Timeout: patient name, date of birth, surgical site, and procedure verified   Procedure prep:  Patient was prepped and draped in usual sterile fashion Prep type:  Chlorhexidine Anesthesia: the lesion was anesthetized in a standard fashion   Anesthetic:  1% lidocaine  w/ epinephrine  1-100,000 buffered w/ 8.4% NaHCO3 (15cc) Instrument used: #15 blade   Hemostasis achieved with: suture, pressure and electrodesiccation   Outcome: patient tolerated procedure well with no complications    Skin repair Complexity:  Intermediate Final length (cm):  5.5 Informed consent: discussed and consent obtained   Timeout: patient name, date of birth, surgical site, and procedure verified   Procedure prep:  Patient was prepped and draped in usual sterile fashion Prep type:  Chlorhexidine Anesthesia: the lesion was anesthetized in a standard  fashion   Anesthetic:  1% lidocaine  w/ epinephrine  1-100,000 buffered w/ 8.4% NaHCO3 Reason for type of repair: reduce tension to allow closure, reduce the risk of dehiscence, infection, and necrosis, reduce subcutaneous dead space and avoid a hematoma, allow closure of the large defect and preserve normal anatomy   Undermining: edges could be approximated without difficulty   Subcutaneous layers (deep stitches):  Suture size:  3-0 Suture type: Monocryl (poliglecaprone 25)   Stitches:  Buried vertical mattress Fine/surface layer approximation (top stitches):  Suture size:  4-0 Suture type: Prolene (polypropylene)   Stitches comment:  Running locked Suture removal (days):  7 Hemostasis achieved with: suture, pressure and electrodesiccation Outcome: patient tolerated procedure well with no complications   Post-procedure details: sterile dressing applied and wound care instructions given   Dressing type: bandage and bacitracin (tegaderm)   Specimen 1 - Surgical pathology Differential Diagnosis: Bx proven BCC  Check Margins: yes Pink bx site 1.8 [id number] Lateral tag Bx proven BCC Given patient's discomfort with lying on back almost flat, offered EDC for faster treatment. May not clear BCC but will debulk it and make it less bothersome. BCC grow slowly and seldom metastasize. Patient prefers excision Patient instructed to leave tegaderm on until she returns for suture removal  Return in about 1 week (around 04/29/2024) for suture removal.  I, Rollie Clipper, RMA, am acting as scribe for Harris Liming, MD .   Documentation: I have reviewed the above documentation for accuracy and completeness, and I agree with the above.  Harris Liming, MD

## 2024-04-22 NOTE — Patient Instructions (Signed)
 Wound Care Instructions  On the day following your surgery, you should begin doing daily dressing changes: Remove the old dressing and discard it. Cleanse the wound gently with tap water. This may be done in the shower or by placing a wet gauze pad directly on the wound and letting it soak for several minutes. It is important to gently remove any dried blood from the wound in order to encourage healing. This may be done by gently rolling a moistened Q-tip on the dried blood. Do not pick at the wound. If the wound should start to bleed, continue cleaning the wound, then place a moist gauze pad on the wound and hold pressure for a few minutes.  Make sure you then dry the skin surrounding the wound completely or the tape will not stick to the skin. Do not use cotton balls on the wound. After the wound is clean and dry, apply the ointment gently with a Q-tip. Cut a non-stick pad to fit the size of the wound. Lay the pad flush to the wound. If the wound is draining, you may want to reinforce it with a small amount of gauze on top of the non-stick pad for a little added compression to the area. Use the tape to seal the area completely. Select from the following with respect to your individual situation: If your wound has been stitched closed: continue the above steps 1-8 at least daily until your sutures are removed. If your wound has been left open to heal: continue steps 1-8 at least daily for the first 3-4 weeks. We would like for you to take a few extra precautions for at least the next week. Sleep with your head elevated on pillows if our wound is on your head. Do not bend over or lift heavy items to reduce the chance of elevated blood pressure to the wound Do not participate in particularly strenuous activities.   Below is a list of dressing supplies you might need.  Cotton-tipped applicators - Q-tips Gauze pads (2x2 and/or 4x4) - All-Purpose Sponges Non-stick dressing material - Telfa Tape -  Paper or Hypafix New and clean tube of petroleum jelly - Vaseline    Comments on Post-Operative Period Slight swelling and redness often appear around the wound. This is normal and will disappear within several days following the surgery. The healing wound will drain a brownish-red-yellow discharge during healing. This is a normal phase of wound healing. As the wound begins to heal, the drainage may increase in amount. Again, this drainage is normal. Notify us if the drainage becomes persistently bloody, excessively swollen, or intensely painful or develops a foul odor or red streaks.  If you should experience mild discomfort during the healing phase, you may take an aspirin-free medication such as Tylenol (acetaminophen). Notify us if the discomfort is severe or persistent. Avoid alcoholic beverages when taking pain medicine.  In Case of Wound Hemorrhage A wound hemorrhage is when the bandage suddenly becomes soaked with bright red blood and flows profusely. If this happens, sit down or lie down with your head elevated. If the wound has a dressing on it, do not remove the dressing. Apply pressure to the existing gauze. If the wound is not covered, use a gauze pad to apply pressure and continue applying the pressure for 20 minutes without peeking. DO NOT COVER THE WOUND WITH A LARGE TOWEL OR WASH CLOTH. Release your hand from the wound site but do not remove the dressing. If the bleeding has stopped,  gently clean around the wound. Leave the dressing in place for 24 hours if possible. This wait time allows the blood vessels to close off so that you do not spark a new round of bleeding by disrupting the newly clotted blood vessels with an immediate dressing change. If the bleeding does not subside, continue to hold pressure. If matters are out of your control, contact an After Hours clinic or go to the Emergency Room.  Due to recent changes in healthcare laws, you may see results of your pathology and/or  laboratory studies on MyChart before the doctors have had a chance to review them. We understand that in some cases there may be results that are confusing or concerning to you. Please understand that not all results are received at the same time and often the doctors may need to interpret multiple results in order to provide you with the best plan of care or course of treatment. Therefore, we ask that you please give [place] 2 business days to thoroughly review all your results before contacting the office for clarification. Should we see a critical lab result, you will be contacted sooner.   If You Need Anything After Your Visit  If you have any questions or concerns for your doctor, please call our main line at [phone] and press option 4 to reach your doctor's medical assistant. If no one answers, please leave a voicemail as directed and we will return your call as soon as possible. Messages left after 4 pm will be answered the following business day.   You may also send [place] a message via MyChart. We typically respond to MyChart messages within 1-2 business days.  For prescription refills, please ask your pharmacy to contact our office. Our fax number is [phone].  If you have an urgent issue when the clinic is closed that cannot wait until the next business day, you can page your doctor at the number below.    Please note that while we do our best to be available for urgent issues outside of office hours, we are not available 24/7.   If you have an urgent issue and are unable to reach [place], you may choose to seek medical care at your doctor's office, retail clinic, urgent care center, or emergency room.  If you have a medical emergency, please immediately call 911 or go to the emergency department.  Pager Numbers  - Dr. Gwen Pounds: [phone]  - Dr. Roseanne Reno: [phone]  - Dr. Katrinka Blazing: [phone]   In the event of inclement weather, please call our main line at [phone] for an  update on the status of any delays or closures.  Dermatology Medication Tips: Please keep the boxes that topical medications come in in order to help keep track of the instructions about where and how to use these. Pharmacies typically print the medication instructions only on the boxes and not directly on the medication tubes.   If your medication is too expensive, please contact our office at [phone] option 4 or send [place] a message through MyChart.   We are unable to tell what your co-pay for medications will be in advance as this is different depending on your insurance coverage. However, we may be able to find a substitute medication at lower cost or fill out paperwork to get insurance to cover a needed medication.   If a prior authorization is required to get your medication covered by your insurance company, please allow [place] 1-2 business days to complete this process.  Drug prices often vary depending on where the prescription is filled and some pharmacies may offer cheaper prices.  The website www.goodrx.com contains coupons for medications through different pharmacies. The prices here do not account for what the cost may be with help from insurance (it may be cheaper with your insurance), but the website can give you the price if you did not use any insurance.  - You can print the associated coupon and take it with your prescription to the pharmacy.  - You may also stop by our office during regular business hours and pick up a GoodRx coupon card.  - If you need your prescription sent electronically to a different pharmacy, notify our office through [facility] or by phone at [phone] option 4.     Si Usted Necesita Algo Despus de Su Visita  Tambin puede enviarnos un mensaje a travs de Clinical cytogeneticist. Por lo general respondemos a los mensajes de MyChart en el transcurso de 1 a 2 das hbiles.  Para renovar recetas, por favor pida a su farmacia que se ponga en contacto con  nuestra oficina. Annie Sable de fax es [place] [phone].  Si tiene un asunto urgente cuando la clnica est cerrada y que no puede esperar hasta el siguiente da hbil, puede llamar/localizar a su doctor(a) al nmero que aparece a continuacin.   Por favor, tenga en cuenta que aunque hacemos todo lo posible para estar disponibles para asuntos urgentes fuera del horario de [place], no estamos disponibles las 24 horas del da, los 7 [address]  [address] de la [place].   Si tiene un problema urgente y no puede comunicarse con nosotros, puede optar por buscar atencin mdica  en el consultorio de su doctor(a), en una clnica privada, en un centro de atencin urgente o en una sala de emergencias.  Si tiene Engineer, drilling, por favor llame inmediatamente al 911 o vaya a la sala de emergencias.  Nmeros de bper  - Dr. Gwen Pounds: [phone]  - Dra. Roseanne Reno: [phone]  - Dr. Katrinka Blazing: [phone]   En caso de inclemencias del tiempo, por favor llame a Lacy Duverney principal al [phone] para una actualizacin sobre el [place] de cualquier retraso o cierre.  Consejos para la medicacin en dermatologa: Por favor, guarde las cajas en las que vienen los medicamentos de uso tpico para ayudarle a seguir las instrucciones sobre dnde y cmo usarlos. Las farmacias generalmente imprimen las instrucciones del medicamento slo en las cajas y no directamente en los tubos del [place].   Si su medicamento es muy caro, por favor, pngase en contacto con Rolm Gala llamando al [phone] y presione la opcin 4 o envenos un mensaje a travs de Clinical cytogeneticist.   No podemos decirle cul ser su copago por los medicamentos por adelantado ya que esto es diferente dependiendo de la cobertura de su seguro. Sin embargo, es posible que podamos encontrar un medicamento sustituto a Audiological scientist un formulario para que el seguro cubra el medicamento que se considera necesario.   Si se requiere una autorizacin  previa para que su compaa de seguros [place] su medicamento, por favor permtanos de 1 a 2 das hbiles para completar [address]  Los precios de los medicamentos varan con frecuencia dependiendo del Environmental consultant de dnde se surte la receta y alguna farmacias pueden ofrecer precios ms baratos.  El sitio web www.goodrx.com tiene cupones para medicamentos de Health and safety inspector. Los precios aqu no tienen en cuenta lo que podra costar con la ayuda del seguro (puede ser  ms barato con su seguro), pero el sitio web puede darle el precio si no Visual merchandiser.  - Puede imprimir el cupn correspondiente y llevarlo con su receta a la farmacia.  - Tambin puede pasar por nuestra oficina durante el horario de atencin regular y Education officer, museum una tarjeta de cupones de GoodRx.  - Si necesita que su receta se enve electrnicamente a una farmacia diferente, informe a nuestra oficina a travs de MyChart de [place] o por telfono llamando al [phone] y presione la opcin 4.

## 2024-04-23 ENCOUNTER — Telehealth: Payer: Self-pay

## 2024-04-23 ENCOUNTER — Ambulatory Visit

## 2024-04-23 DIAGNOSIS — Z48817 Encounter for surgical aftercare following surgery on the skin and subcutaneous tissue: Secondary | ICD-10-CM

## 2024-04-23 DIAGNOSIS — Z85828 Personal history of other malignant neoplasm of skin: Secondary | ICD-10-CM

## 2024-04-23 NOTE — Progress Notes (Signed)
 Patient here today for dressing change of L inframammary excision site.   Dr. Felipe Horton removed tegaderm dressing and cleaned site with no complications from surgery. Mupirocin ointment applied, telfa and pressure dressing with paper tape per Dr. Shirlie Dove instructions.  New pressure dressing placed for patient until Monday.  Lisbeth Rides, RMA

## 2024-04-23 NOTE — Telephone Encounter (Signed)
 Called patient to check on her after yesterdays surgery.  Pt did notice some blood on her bandage.  Advised pt she could come in today at 2:00pm for bandage change./sh

## 2024-04-27 ENCOUNTER — Encounter: Payer: Self-pay | Admitting: Dermatology

## 2024-04-27 ENCOUNTER — Ambulatory Visit: Admitting: Dermatology

## 2024-04-27 DIAGNOSIS — Z48817 Encounter for surgical aftercare following surgery on the skin and subcutaneous tissue: Secondary | ICD-10-CM

## 2024-04-27 DIAGNOSIS — Z85828 Personal history of other malignant neoplasm of skin: Secondary | ICD-10-CM

## 2024-04-27 DIAGNOSIS — Z5189 Encounter for other specified aftercare: Secondary | ICD-10-CM

## 2024-04-27 NOTE — Progress Notes (Signed)
 Patient here today for dressing change and wound re check. Excision site is healing nicely and patient has no complaints. Applied new telfa with Mupirocin  Ointment and covered with medical tape. Patient has appointment with Dr. Felipe Horton Thursday at 2pm for suture removal. aw

## 2024-04-28 LAB — SURGICAL PATHOLOGY

## 2024-04-29 ENCOUNTER — Ambulatory Visit: Payer: Self-pay | Admitting: Dermatology

## 2024-04-29 NOTE — Telephone Encounter (Signed)
-----   Message from [place] sent at 04/29/2024  9:46 AM EDT ----- Diagnosis L inframammary :       NO RESIDUAL BASAL CELL CARCINOMA, MARGINS FREE    Please call to share that excision was clear of basal cell skin cancer and get update on surgical wound. Thank you.

## 2024-04-29 NOTE — Telephone Encounter (Signed)
 Discussed pathology results. Wound is doing well. Suture removal scheduled for tomorrow.

## 2024-04-30 ENCOUNTER — Ambulatory Visit (INDEPENDENT_AMBULATORY_CARE_PROVIDER_SITE_OTHER): Admitting: Dermatology

## 2024-04-30 DIAGNOSIS — Z4802 Encounter for removal of sutures: Secondary | ICD-10-CM

## 2024-04-30 DIAGNOSIS — Z48817 Encounter for surgical aftercare following surgery on the skin and subcutaneous tissue: Secondary | ICD-10-CM

## 2024-04-30 DIAGNOSIS — L820 Inflamed seborrheic keratosis: Secondary | ICD-10-CM

## 2024-04-30 DIAGNOSIS — Z85828 Personal history of other malignant neoplasm of skin: Secondary | ICD-10-CM

## 2024-04-30 DIAGNOSIS — Z5189 Encounter for other specified aftercare: Secondary | ICD-10-CM

## 2024-04-30 NOTE — Patient Instructions (Signed)
 After Suture Removal  If your medical team has placed Steri-Strips (white adhesive strips covering the surgical site to provide extra support): Keep the area dry until they fall off.  Do not peel them off. Just let them fall off on their own.  If the edges peel up, you can trim them with scissors.   If your team has not placed Steri-Strips: Wash the area daily with soap and water. Then coat the incision site with plain Vaseline and cover with a bandage. Do this daily for 5 days after the sutures are removed. After that, no additional wound care is generally needed.  However, if you would like to help fade the scar, you can apply a silicone scar cream, gel or sheet every night. The scar will remodel for one year after the procedure. If a skin cancer was removed, be sure to keep your appointment with your dermatologist for follow-up and let your dermatology team know if you have any new or changing spots between visits.    Please call our office at [phone] for any questions or concerns.   Seborrheic Keratosis  What causes seborrheic keratoses? Seborrheic keratoses are harmless, common skin growths that first appear during adult life.  As time goes by, more growths appear.  Some people may develop a large number of them.  Seborrheic keratoses appear on both covered and uncovered body parts.  They are not caused by sunlight.  The tendency to develop seborrheic keratoses can be inherited.  They vary in color from skin-colored to gray, brown, or even black.  They can be either smooth or have a rough, warty surface.   Seborrheic keratoses are superficial and look as if they were stuck on the skin.  Under the microscope this type of keratosis looks like layers upon layers of skin.  That is why at times the top layer may seem to fall off, but the rest of the growth remains and re-grows.    Treatment Seborrheic keratoses do not need to be treated, but can easily be removed in the office.   Seborrheic keratoses often cause symptoms when they rub on clothing or jewelry.  Lesions can be in the way of shaving.  If they become inflamed, they can cause itching, soreness, or burning.  Removal of a seborrheic keratosis can be accomplished by freezing, burning, or surgery. If any spot bleeds, scabs, or grows rapidly, please return to have it checked, as these can be an indication of a skin cancer.  Cryotherapy Aftercare  Wash gently with soap and water everyday.   Apply Vaseline and Band-Aid daily until healed.   Due to recent changes in healthcare laws, you may see results of your pathology and/or laboratory studies on MyChart before the doctors have had a chance to review them. We understand that in some cases there may be results that are confusing or concerning to you. Please understand that not all results are received at the same time and often the doctors may need to interpret multiple results in order to provide you with the best plan of care or course of treatment. Therefore, we ask that you please give us  2 business days to thoroughly review all your results before contacting the office for clarification. Should we see a critical lab result, you will be contacted sooner.   If You Need Anything After Your Visit  If you have any questions or concerns for your doctor, please call our main line at [phone] and press option 4 to reach your  doctor's Engineer, site. If no one answers, please leave a voicemail as directed and we will return your call as soon as possible. Messages left after 4 pm will be answered the following business day.   You may also send us  a message via MyChart. We typically respond to MyChart messages within 1-2 business days.  For prescription refills, please ask your pharmacy to contact our office. Our fax number is [phone].  If you have an urgent issue when the clinic is closed that cannot wait until the next business day, you can page your doctor at  the number below.    Please note that while we do our best to be available for urgent issues outside of office hours, we are not available 24/7.   If you have an urgent issue and are unable to reach us , you may choose to seek medical care at your doctor's office, retail clinic, urgent care center, or emergency room.  If you have a medical emergency, please immediately call 911 or go to the emergency department.  Pager Numbers  - Dr. Bary Likes: [phone]  - Dr. Annette Barters: [phone]  - Dr. Felipe Horton: [phone]   In the event of inclement weather, please call our main line at [phone] for an update on the status of any delays or closures.  Dermatology Medication Tips: Please keep the boxes that topical medications come in in order to help keep track of the instructions about where and how to use these. Pharmacies typically print the medication instructions only on the boxes and not directly on the medication tubes.   If your medication is too expensive, please contact our office at [phone] option 4 or send us  a message through MyChart.   We are unable to tell what your co-pay for medications will be in advance as this is different depending on your insurance coverage. However, we may be able to find a substitute medication at lower cost or fill out paperwork to get insurance to cover a needed medication.   If a prior authorization is required to get your medication covered by your insurance company, please allow us  1-2 business days to complete this process.  Drug prices often vary depending on where the prescription is filled and some pharmacies may offer cheaper prices.  The website www.goodrx.com contains coupons for medications through different pharmacies. The prices here do not account for what the cost may be with help from insurance (it may be cheaper with your insurance), but the website can give you the price if you did not use any insurance.  - You can print the  associated coupon and take it with your prescription to the pharmacy.  - You may also stop by our office during regular business hours and pick up a GoodRx coupon card.  - If you need your prescription sent electronically to a different pharmacy, notify our office through [facility] or by phone at [phone] option 4.     Si Usted Necesita Algo Despus de Su Visita  Tambin puede enviarnos un mensaje a travs de Clinical cytogeneticist. Por lo general respondemos a los mensajes de MyChart en el transcurso de 1 a 2 das hbiles.  Para renovar recetas, por favor pida a su farmacia que se ponga en contacto con nuestra oficina. Franz Jacks de fax es [place] [phone].  Si tiene un asunto urgente cuando la clnica est cerrada y que no puede esperar hasta el siguiente da hbil, puede llamar/localizar a su doctor(a) al nmero que aparece a continuacin.  Por favor, tenga en cuenta que aunque hacemos todo lo posible para estar disponibles para asuntos urgentes fuera del horario de [place], no estamos disponibles las 24 horas del da, los 7 [address]  [address] de la [place].   Si tiene un problema urgente y no puede comunicarse con nosotros, puede optar por buscar atencin mdica  en el consultorio de su doctor(a), en una clnica privada, en un centro de atencin urgente o en una sala de emergencias.  Si tiene Engineer, drilling, por favor llame inmediatamente al 911 o vaya a la sala de emergencias.  Nmeros de bper  - Dr. Bary Likes: [phone]  - Dra. Annette Barters: [phone]  - Dr. Felipe Horton: [phone]   En caso de inclemencias del tiempo, por favor llame a Lajuan Pila principal al [phone] para una actualizacin sobre el [place] de cualquier retraso o cierre.  Consejos para la medicacin en dermatologa: Por favor, guarde las cajas en las que vienen los medicamentos de uso tpico para ayudarle a seguir las instrucciones sobre dnde y cmo usarlos. Las farmacias generalmente imprimen las instrucciones  del medicamento slo en las cajas y no directamente en los tubos del [place].   Si su medicamento es muy caro, por favor, pngase en contacto con Bettyjane Brunet llamando al [phone] y presione la opcin 4 o envenos un mensaje a travs de Clinical cytogeneticist.   No podemos decirle cul ser su copago por los medicamentos por adelantado ya que esto es diferente dependiendo de la cobertura de su seguro. Sin embargo, es posible que podamos encontrar un medicamento sustituto a Audiological scientist un formulario para que el seguro cubra el medicamento que se considera necesario.   Si se requiere una autorizacin previa para que su compaa de seguros [place] su medicamento, por favor permtanos de 1 a 2 das hbiles para completar este proceso.  Los precios de los medicamentos varan con frecuencia dependiendo del Environmental consultant de dnde se surte la receta y alguna farmacias pueden ofrecer precios ms baratos.  El sitio web www.goodrx.com tiene cupones para medicamentos de Health and safety inspector. Los precios aqu no tienen en cuenta lo que podra costar con la ayuda del seguro (puede ser ms barato con su seguro), pero el sitio web puede darle el precio si no utiliz Tourist information centre manager.  - Puede imprimir el cupn correspondiente y llevarlo con su receta a la farmacia.  - Tambin puede pasar por nuestra oficina durante el horario de atencin regular y Education officer, museum una tarjeta de cupones de GoodRx.  - Si necesita que su receta se enve electrnicamente a una farmacia diferente, informe a nuestra oficina a travs de MyChart de [place] o por telfono llamando al [phone] y presione la opcin 4.

## 2024-04-30 NOTE — Progress Notes (Signed)
   Follow-Up Visit   Subjective  Pamela Norman is a 83 y.o. female who presents for the following: Suture removal  Pathology showed no residual basal cell carcinoma margins free at left inframammary   The following portions of the chart were reviewed this encounter and updated as appropriate: medications, allergies, medical history  Review of Systems:  No other skin or systemic complaints except as noted in HPI or Assessment and Plan.  Objective  Well appearing patient in no apparent distress; mood and affect are within normal limits.  Areas Examined: Left inframammary   Relevant physical exam findings are noted in the Assessment and Plan.  left inferior breast x 5 (5) Erythematous stuck-on, waxy papule or plaque  Assessment & Plan    INFLAMED SEBORRHEIC KERATOSIS (5) left inferior breast x 5 (5) Symptomatic, irritating, patient would like treated. Destruction of lesion - left inferior breast x 5 (5) Complexity: simple   Destruction method: cryotherapy   Informed consent: discussed and consent obtained   Timeout:  patient name, date of birth, surgical site, and procedure verified Lesion destroyed using liquid nitrogen: Yes   Region frozen until ice ball extended beyond lesion: Yes   Cryo cycles: 1 or 2. Outcome: patient tolerated procedure well with no complications   Post-procedure details: wound care instructions given   VISIT FOR WOUND CHECK   ENCOUNTER FOR REMOVAL OF SUTURES   Encounter for Removal of Sutures - Incision site is clean, dry and intact. - Wound cleansed, sutures removed, wound cleansed and steri strips applied.  - Discussed pathology results showing no residual basal cell carcinoma margins free  - Patient advised to keep steri-strips dry until they fall off. - Scars remodel for a full year. - Once steri-strips fall off, patient can apply over-the-counter silicone scar cream once to twice a day to help with scar remodeling if desired. - Patient  advised to call with any concerns or if they notice any new or changing lesions.  Return for 6 month tbse .  I, Randee Busing, CMA, am acting as scribe for Harris Liming, MD.   Documentation: I have reviewed the above documentation for accuracy and completeness, and I agree with the above.  Harris Liming, MD

## 2024-05-07 ENCOUNTER — Encounter: Payer: Medicare Other | Admitting: Family Medicine

## 2024-05-14 ENCOUNTER — Encounter: Admitting: Nurse Practitioner

## 2024-05-14 NOTE — Progress Notes (Deleted)
 Patient rescheduled.

## 2024-05-15 ENCOUNTER — Encounter: Payer: Self-pay | Admitting: Nurse Practitioner

## 2024-05-15 ENCOUNTER — Ambulatory Visit: Admitting: Nurse Practitioner

## 2024-05-15 VITALS — BP 138/78 | HR 100 | Temp 97.6°F | Ht 66.5 in | Wt 191.6 lb

## 2024-05-15 DIAGNOSIS — E785 Hyperlipidemia, unspecified: Secondary | ICD-10-CM | POA: Diagnosis not present

## 2024-05-15 DIAGNOSIS — Z7984 Long term (current) use of oral hypoglycemic drugs: Secondary | ICD-10-CM | POA: Diagnosis not present

## 2024-05-15 DIAGNOSIS — E119 Type 2 diabetes mellitus without complications: Secondary | ICD-10-CM

## 2024-05-15 DIAGNOSIS — Z8659 Personal history of other mental and behavioral disorders: Secondary | ICD-10-CM | POA: Diagnosis not present

## 2024-05-15 NOTE — Assessment & Plan Note (Signed)
 Managed with duloxetine  and risperidone  as needed, which aids in muscle relaxation, pain management and sleep. Continue duloxetine  30 mg daily. Continue risperidone  as needed for sleep.

## 2024-05-15 NOTE — Assessment & Plan Note (Addendum)
 Chronic. Last A1c at 6.8 in November indicates well-controlled diabetes. She is on metformin  without symptoms of hyperglycemia or hypoglycemia. Consider home glucose monitoring and weight loss through lifestyle changes. Continue metformin  once daily. Encourage increased physical activity and dietary changes for weight loss. Reassess A1c in three months.

## 2024-05-15 NOTE — Assessment & Plan Note (Signed)
 Chronic. Managed with atorvastatin , which is well-tolerated without new symptoms. Continue atorvastatin  80 mg daily.

## 2024-05-15 NOTE — Progress Notes (Signed)
 Patient rescheduled.  Pamela Burkitt, NP-C Phone: [phone]  Pamela Norman is a 83 y.o. female who presents today for transfer of care.   Discussed the use of AI scribe software for clinical note transcription with the patient, who gave verbal consent to proceed.  History of Present Illness   Pamela Norman "Pamela Norman" is an 83 year old female with type 2 diabetes who presents for transfer of care.  She manages her type 2 diabetes with metformin , taken once daily. Her last A1c was 6.8, indicating good control. She does not regularly check her blood sugar but has the equipment to do so. No symptoms of increased thirst, increased urination, or hypoglycemia.  She is on atorvastatin  for hyperlipidemia and reports no new abdominal pains or muscle aches, indicating good tolerance to the medication.  For mental health, she takes duloxetine , which aids in muscle relaxation and pain management. She also uses risperidone  as needed for sleep, indicating occasional sleep disturbances.  She plans to increase her physical activity by using a treadmill, recumbent bike, and participating in walking exercises, aiming for a 'twenty, twenty, twenty' routine. She wants to cut out desserts to aid in weight loss. She resides at [facility], where meals include daily desserts, which she finds hard to resist but is committed to reducing.      Social History   Tobacco Use  Smoking Status Former   Current packs/day: 0.00   Average packs/day: 0.3 packs/day for 20.0 years (5.0 ttl pk-yrs)   Types: Cigarettes   Start date: 04/13/1961   Quit date: 04/13/1981   Years since quitting: 43.1  Smokeless Tobacco Never    Current Outpatient Medications on File Prior to Visit  Medication Sig Dispense Refill   atorvastatin  (LIPITOR) 80 MG tablet Take 1 tablet (80 mg total) by mouth daily. 90 tablet 3   cholecalciferol (VITAMIN D ) 1000 units tablet Take 1,000 Units by mouth daily.     DULoxetine  (CYMBALTA ) 30 MG capsule TAKE 1  CAPSULE(30 MG) BY MOUTH DAILY 90 capsule 0   metFORMIN  (GLUCOPHAGE ) 500 MG tablet TAKE 1 TABLET(500 MG) BY MOUTH DAILY WITH BREAKFAST 90 tablet 0   Multiple Vitamin (MULTIVITAMIN) tablet Take 1 tablet by mouth daily.     risperiDONE  (RISPERDAL ) 1 MG tablet TAKE 1 TABLET(1 MG) BY MOUTH AT BEDTIME AS NEEDED 90 tablet 0   No current facility-administered medications on file prior to visit.     ROS see history of present illness  Objective  Physical Exam Vitals:   05/15/24 1307  BP: 138/78  Pulse: 100  Temp: 97.6 F (36.4 C)  SpO2: 96%    BP Readings from Last 3 Encounters:  05/15/24 138/78  04/21/24 (!) 140/80  11/01/23 116/74   Wt Readings from Last 3 Encounters:  05/15/24 191 lb 9.6 oz (86.9 kg)  04/21/24 190 lb (86.2 kg)  11/01/23 183 lb 9.6 oz (83.3 kg)    Physical Exam Constitutional:      General: She is not in acute distress.    Appearance: Normal appearance.  HENT:     Head: Normocephalic.  Cardiovascular:     Rate and Rhythm: Normal rate and regular rhythm.     Heart sounds: Normal heart sounds.  Pulmonary:     Effort: Pulmonary effort is normal.     Breath sounds: Normal breath sounds.  Skin:    General: Skin is warm and dry.  Neurological:     General: No focal deficit present.     Mental Status:  She is alert.  Psychiatric:        Mood and Affect: Mood normal.        Behavior: Behavior normal.      Assessment/Plan: Please see individual problem list.  Diabetes mellitus without complication (HCC) Assessment & Plan: Chronic. Last A1c at 6.8 in November indicates well-controlled diabetes. She is on metformin  without symptoms of hyperglycemia or hypoglycemia. Consider home glucose monitoring and weight loss through lifestyle changes. Continue metformin  once daily. Encourage increased physical activity and dietary changes for weight loss. Reassess A1c in three months.   Hyperlipidemia, unspecified hyperlipidemia type Assessment & Plan: Chronic.  Managed with atorvastatin , which is well-tolerated without new symptoms. Continue atorvastatin  80 mg daily.   History of psychosis Assessment & Plan: Managed with duloxetine  and risperidone  as needed, which aids in muscle relaxation, pain management and sleep. Continue duloxetine  30 mg daily. Continue risperidone  as needed for sleep.      Return in about 3 months (around 08/15/2024) for Follow up.   Pamela Burkitt, NP-C [facility]

## 2024-05-21 ENCOUNTER — Other Ambulatory Visit: Payer: Self-pay | Admitting: Internal Medicine

## 2024-05-21 DIAGNOSIS — E119 Type 2 diabetes mellitus without complications: Secondary | ICD-10-CM

## 2024-06-16 ENCOUNTER — Other Ambulatory Visit: Payer: Self-pay

## 2024-06-16 MED ORDER — DULOXETINE HCL 30 MG PO CPEP: 30.0000 mg | ORAL_CAPSULE | Freq: Every day | ORAL | 3 refills | Status: DC

## 2024-08-18 ENCOUNTER — Ambulatory Visit: Admitting: Nurse Practitioner

## 2024-09-10 ENCOUNTER — Ambulatory Visit: Admitting: Nurse Practitioner

## 2024-09-15 ENCOUNTER — Other Ambulatory Visit: Payer: Self-pay | Admitting: Nurse Practitioner

## 2024-09-17 ENCOUNTER — Telehealth: Payer: Self-pay

## 2024-09-17 NOTE — Telephone Encounter (Signed)
 Copied from CRM #[id number]. Topic: Clinical - Medication Question >> Sep 17, 2024 10:37 AM Dedra B wrote: Reason for CRM: Pt calling to follow up on refill request for risperidone  1 mg.

## 2024-09-18 NOTE — Telephone Encounter (Signed)
Medication sent. Pt notified.

## 2024-10-02 ENCOUNTER — Emergency Department
Admission: EM | Admit: 2024-10-02 | Discharge: 2024-10-02 | Disposition: A | Discharge status: Home / Self Care | Attending: Emergency Medicine | Admitting: Emergency Medicine

## 2024-10-02 ENCOUNTER — Encounter: Payer: Self-pay | Admitting: Intensive Care

## 2024-10-02 ENCOUNTER — Other Ambulatory Visit: Payer: Self-pay

## 2024-10-02 ENCOUNTER — Emergency Department

## 2024-10-02 DIAGNOSIS — R000 Tachycardia, unspecified: Secondary | ICD-10-CM | POA: Diagnosis not present

## 2024-10-02 DIAGNOSIS — Z79899 Other long term (current) drug therapy: Secondary | ICD-10-CM | POA: Diagnosis not present

## 2024-10-02 DIAGNOSIS — F419 Anxiety disorder, unspecified: Secondary | ICD-10-CM | POA: Diagnosis not present

## 2024-10-02 DIAGNOSIS — E119 Type 2 diabetes mellitus without complications: Secondary | ICD-10-CM | POA: Insufficient documentation

## 2024-10-02 DIAGNOSIS — Z85828 Personal history of other malignant neoplasm of skin: Secondary | ICD-10-CM | POA: Diagnosis not present

## 2024-10-02 DIAGNOSIS — Z8542 Personal history of malignant neoplasm of other parts of uterus: Secondary | ICD-10-CM | POA: Insufficient documentation

## 2024-10-02 DIAGNOSIS — F29 Unspecified psychosis not due to a substance or known physiological condition: Secondary | ICD-10-CM | POA: Diagnosis not present

## 2024-10-02 DIAGNOSIS — M549 Dorsalgia, unspecified: Secondary | ICD-10-CM | POA: Diagnosis not present

## 2024-10-02 DIAGNOSIS — E785 Hyperlipidemia, unspecified: Secondary | ICD-10-CM | POA: Insufficient documentation

## 2024-10-02 DIAGNOSIS — F989 Unspecified behavioral and emotional disorders with onset usually occurring in childhood and adolescence: Secondary | ICD-10-CM | POA: Diagnosis not present

## 2024-10-02 DIAGNOSIS — F919 Conduct disorder, unspecified: Secondary | ICD-10-CM | POA: Diagnosis not present

## 2024-10-02 DIAGNOSIS — R4182 Altered mental status, unspecified: Secondary | ICD-10-CM | POA: Diagnosis present

## 2024-10-02 DIAGNOSIS — R4689 Other symptoms and signs involving appearance and behavior: Secondary | ICD-10-CM

## 2024-10-02 DIAGNOSIS — R442 Other hallucinations: Secondary | ICD-10-CM | POA: Diagnosis not present

## 2024-10-02 DIAGNOSIS — Z046 Encounter for general psychiatric examination, requested by authority: Secondary | ICD-10-CM | POA: Diagnosis not present

## 2024-10-02 DIAGNOSIS — N390 Urinary tract infection, site not specified: Secondary | ICD-10-CM | POA: Diagnosis not present

## 2024-10-02 DIAGNOSIS — F69 Unspecified disorder of adult personality and behavior: Secondary | ICD-10-CM | POA: Diagnosis not present

## 2024-10-02 LAB — URINE DRUG SCREEN, QUALITATIVE (ARMC ONLY)
Amphetamines, Ur Screen: NOT DETECTED
Barbiturates, Ur Screen: NOT DETECTED
Benzodiazepine, Ur Scrn: NOT DETECTED
Cannabinoid 50 Ng, Ur ~~LOC~~: NOT DETECTED
Cocaine Metabolite,Ur ~~LOC~~: NOT DETECTED
MDMA (Ecstasy)Ur Screen: NOT DETECTED
Methadone Scn, Ur: NOT DETECTED
Opiate, Ur Screen: NOT DETECTED
Phencyclidine (PCP) Ur S: NOT DETECTED
Tricyclic, Ur Screen: NOT DETECTED

## 2024-10-02 LAB — COMPREHENSIVE METABOLIC PANEL WITH GFR
ALT: 17 U/L (ref 0–44)
AST: 23 U/L (ref 15–41)
Albumin: 4.6 g/dL (ref 3.5–5.0)
Alkaline Phosphatase: 64 U/L (ref 38–126)
Anion gap: 14 (ref 5–15)
BUN: 33 mg/dL — ABNORMAL HIGH (ref 8–23)
CO2: 23 mmol/L (ref 22–32)
Calcium: 9.9 mg/dL (ref 8.9–10.3)
Chloride: 105 mmol/L (ref 98–111)
Creatinine, Ser: 1.01 mg/dL — ABNORMAL HIGH (ref 0.44–1.00)
GFR, Estimated: 56 mL/min — ABNORMAL LOW (ref >60)
Glucose, Bld: 136 mg/dL — ABNORMAL HIGH (ref 70–99)
Potassium: 4.5 mmol/L (ref 3.5–5.1)
Sodium: 142 mmol/L (ref 135–145)
Total Bilirubin: 0.9 mg/dL (ref 0.0–1.2)
Total Protein: 8.2 g/dL — ABNORMAL HIGH (ref 6.5–8.1)

## 2024-10-02 LAB — URINALYSIS, ROUTINE W REFLEX MICROSCOPIC
Bilirubin Urine: NEGATIVE
Glucose, UA: NEGATIVE mg/dL
Hgb urine dipstick: NEGATIVE
Ketones, ur: 5 mg/dL — AB
Nitrite: POSITIVE — AB
Protein, ur: 30 mg/dL — AB
Specific Gravity, Urine: 1.03 (ref 1.005–1.030)
pH: 5 (ref 5.0–8.0)

## 2024-10-02 LAB — CBC
HCT: 44 % (ref 36.0–46.0)
Hemoglobin: 14.1 g/dL (ref 12.0–15.0)
MCH: 29.9 pg (ref 26.0–34.0)
MCHC: 32 g/dL (ref 30.0–36.0)
MCV: 93.2 fL (ref 80.0–100.0)
Platelets: 318 K/uL (ref 150–400)
RBC: 4.72 MIL/uL (ref 3.87–5.11)
RDW: 13.2 % (ref 11.5–15.5)
WBC: 9.9 K/uL (ref 4.0–10.5)
nRBC: 0 % (ref 0.0–0.2)

## 2024-10-02 LAB — ETHANOL: Alcohol, Ethyl (B): <15 mg/dL (ref <15)

## 2024-10-02 MED ORDER — CEPHALEXIN 500 MG PO CAPS: 500.0000 mg | ORAL_CAPSULE | Freq: Two times a day (BID) | ORAL | Status: DC

## 2024-10-02 MED ORDER — SODIUM CHLORIDE 0.9 % IV SOLN
1.0000 g | Freq: Once | INTRAVENOUS | Status: AC
  Administered 2024-10-02: 1 g via INTRAVENOUS
  Filled 2024-10-02: qty 10

## 2024-10-02 MED ORDER — FOSFOMYCIN TROMETHAMINE 3 G PO PACK
3.0000 g | PACK | Freq: Once | ORAL | Status: AC
  Administered 2024-10-02: 3 g via ORAL
  Filled 2024-10-02: qty 3

## 2024-10-02 MED ORDER — LORAZEPAM 2 MG/ML IJ SOLN
2.0000 mg | Freq: Once | INTRAMUSCULAR | Status: AC
  Administered 2024-10-02: 2 mg via INTRAVENOUS
  Filled 2024-10-02: qty 1

## 2024-10-02 NOTE — ED Notes (Signed)
 CT attempted to come get pt fro scan, this RN told CT tech that I will call her when she is ready to go over

## 2024-10-02 NOTE — Consult Note (Signed)
 [facility] Psychiatric Consult Initial  Patient Name: .[name]  MRN: [id number]  DOB: 1941-05-29  Consult Order details:  Orders (From admission, onward)     Start     Ordered   10/02/24 1056  CONSULT TO CALL ACT TEAM       Ordering Provider: Jacolyn Pae, MD  Provider:  (Not yet assigned)  Question:  Reason for Consult?  Answer:  Psych consult   10/02/24 1056   10/02/24 1056  IP CONSULT TO PSYCHIATRY       Ordering Provider: Jacolyn Pae, MD  Provider:  (Not yet assigned)  Question Answer Comment  Reason for consult: Other (see comments)   Comments: Possible acute psychosis      10/02/24 1056             Mode of Visit: In person    Psychiatry Consult Evaluation  Service Date: October 02, 2024 LOS:  LOS: 0 days  Chief Complaint Body Twisting  Primary Psychiatric Diagnoses  UTI   Assessment  Pamela Norman is a 83 y.o. female admitted: Presented to the ED  Per EDP note:  Pamela Norman is a 83 y.o. female with a history of type 2 diabetes, hyperlipidemia, and psychosis on duloxetine  and risperidone  who presents with concern for an acute psychiatric condition.  She was brought by EMS from [facility] community.  She is reporting that her body is twisted, and the staff told EMS that she has had similar complaints when having an acute episode of psychosis in the past.  The patient is unable to give any reliable history.  She appears very anxious and upset, stating that her body is turned around and that it's not okay when we try to reassure her and encouraged her to get back into the stretcher so that we can evaluate her.   I reviewed the past medical records.  The patient's most recent outpatient encounter in our system was on 6/6 with primary care for follow-up of her chronic conditions.  She has no recent psychiatric evaluations or hospitalizations.  On assessment today, patient was alert and oriented x 4 with psychiatry team.  She  reported the body twisting is not a feeling it is real.  She reported this feeling is predominantly in her abdominal area.  She denied any current suicidal or homicidal ideations.  She denied any auditory or visual hallucinations as well.  When asked what patient takes Cymbalta  and risperidone  for, patient stated my doctor gives them to me to relax.On current presentation there was no evidence of psychosis or mania and patient did not appear to be responding to internal stimuli. At this time, patient does not appear to be a risk to self or others.While future psychiatric events cannot be accurately predicted, the patient does not currently require acute inpatient psychiatric care and does not currently meet [place]  involuntary commitment criteria.  Patient was offered voluntary admission by psychiatric team, but patient felt her symptoms were physical in nature and declined voluntary admission.  Patient reported this is not psych, this is physical.  Patient was able to tell this provider she currently resides at an independent living facility.  This information was verified by staff at the facility.  Patient's urine did show signs of urinary tract infection, which could likely be the cause to patient's symptoms.  At this time, patient does not meet inpatient psychiatric criteria for IVC criteria and patient's symptoms seem to be related to patient's current UTI.  Diagnoses:  Active Hospital problems: Active Problems:   * No active hospital problems. *    Plan   ## Psychiatric Medication Recommendations:  None at this time  ## Medical Decision Making Capacity: Not specifically addressed in this encounter     ## Disposition:-- There are no psychiatric contraindications to discharge at this time  ## Behavioral / Environmental: - No specific recommendations at this time.     ## Safety and Observation Level:  - Based on my clinical evaluation, I estimate the patient to be at low risk  of self harm in the current setting. - At this time, we recommend  routine. This decision is based on my review of the chart including patient's history and current presentation, interview of the patient, mental status examination, and consideration of suicide risk including evaluating suicidal ideation, plan, intent, suicidal or self-harm behaviors, risk factors, and protective factors. This judgment is based on our ability to directly address suicide risk, implement suicide prevention strategies, and develop a safety plan while the patient is in the clinical setting. Please contact our team if there is a concern that risk level has changed.  CSSR Risk Category:C-SSRS RISK CATEGORY: No Risk  Suicide Risk Assessment: Patient has following modifiable risk factors for suicide: pain, medical illness (ie new dx of cancer), which we are addressing by patient receiving treatment for urinary tract infection.. Patient has following non-modifiable or demographic risk factors for suicide: N/A Patient has the following protective factors against suicide: Access to outpatient mental health care, Supportive family, Supportive friends, and no history of suicide attempts  Thank you for this consult request. Recommendations have been communicated to the primary team.  We will sign off at this time.   Zelda Sharps, NP        History of Present Illness  Relevant Aspects of Hospital ED   Patient Report:  On assessment today, patient was alert and oriented x 4 with psychiatry team.  She reported the body twisting is not a feeling it is real.  She reported this feeling is predominantly in her abdominal area.  She denied any current suicidal or homicidal ideations.  She denied any auditory or visual hallucinations as well.  When asked what patient takes Cymbalta  and risperidone  for, patient stated my doctor gives them to me to relax.On current presentation there was no evidence of psychosis or mania and patient did  not appear to be responding to internal stimuli. At this time, patient does not appear to be a risk to self or others.While future psychiatric events cannot be accurately predicted, the patient does not currently require acute inpatient psychiatric care and does not currently meet [place]  involuntary commitment criteria.  Patient was offered voluntary admission by psychiatric team, but patient felt her symptoms were physical in nature and declined voluntary admission.  Patient reported this is not psych, this is physical.  Patient was able to tell this provider she currently resides at an independent living facility.  This information was verified by staff at the facility.  Patient's urine did show signs of urinary tract infection, which could likely be the cause to patient's symptoms.  At this time, patient does not meet inpatient psychiatric criteria for IVC criteria and patient's symptoms seem to be related to patient's current UTI.  The facility was called by this provider who reported the only thing noted on patient's history was history of psychosis.  They stated they believed in the past that when patient had stated her body was twisting  that this was usually related to a psychiatric episode.  We discussed the above information with patient's staff, and discussed how UTI symptoms can present similarly.  They voiced understanding and reported they will continue to monitor patient, but patient is a resident at the independent living so she is primarily independent.  They did deny any concerns that patient would harm herself or others.  They denied any access to weapons.    Psych ROS:  Depression: Denied Anxiety:  Denied Mania (lifetime and current): Denied Psychosis: (lifetime and current): History of psychosis listed in patient chart at independent living facility.  No evidence of patient currently responding to internal stimuli or evidence of delusions currently.  Patient was alert  and oriented x 4 and appropriately responding to this provider's questions.  Collateral information:  See above    Psychiatric and Social History  Psychiatric History:  Information collected from Patient/ chart review  Prev Dx/Sx: Hx of psychosis per independent living facility Current Psych Provider: Doctor at [facility] (current): Cymbalta  and risperidone  Previous Med Trials: Unsure Therapy: Denied  Prior Psych Hospitalization: Denied  Prior Self Harm: Denied Prior Violence: Denied  Family Psych History: Unsure Family Hx suicide: Denied  Social History:   Educational Hx: Unknown Occupational Hx: Retired Armed forces operational officer Hx: Denied  Living Situation: Independent living facility Spiritual Hx: Unknown Access to weapons/lethal means: Denied   Substance History Alcohol: Denied  Tobacco: Denied Illicit drugs: Denied Prescription drug abuse: Denied Rehab hx: Denied  Exam Findings  Physical Exam: Deferred to EDP- note reviewed   Vital Signs:  Temp:  [98.7 F (37.1 C)] 98.7 F (37.1 C) (10/24 1039) Pulse Rate:  [108] 108 (10/24 1037) Resp:  [16] 16 (10/24 1037) BP: (171)/(108) 171/108 (10/24 1037) SpO2:  [97 %] 97 % (10/24 1037) Weight:  [80.7 kg] 80.7 kg (10/24 1039) Blood pressure (!) 171/108, pulse (!) 108, temperature 98.7 F (37.1 C), temperature source Axillary, resp. rate 16, height 5' 6 (1.676 m), weight 80.7 kg, SpO2 97%. Body mass index is 28.73 kg/m.    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  Family History:  The patient's family history includes COPD in her father and mother; Colon cancer in her father.  Medical History: Past Medical History:  Diagnosis Date   Basal cell carcinoma 02/17/2024   Left inframammry. Nodular. Excised 04/22/24   Cataract    Cervical stenosis of spine    Endometrial cancer (HCC) 2016   Grade 1,total laparoscopic hysterectomy   H/O: CVA (cerebrovascular accident) 08/02/2015   History of colonoscopy 2014  within normal limits   History of ovarian cyst 01/2015   Hyperlipidemia    Rectal prolapse    Squamous cell carcinoma    face    Surgical History: Past Surgical History:  Procedure Laterality Date   ABDOMINAL HYSTERECTOMY     APPENDECTOMY     BILATERAL SALPINGOOPHORECTOMY  03/30/15   CATARACT EXTRACTION W/PHACO Right 02/06/2016   Procedure: CATARACT EXTRACTION PHACO AND INTRAOCULAR LENS PLACEMENT (IOC);  Surgeon: Steven Dingeldein, MD;  Location: [facility] ORS;  Service: Ophthalmology;  Laterality: Right;  US  01:39 AP% 24.9 CDE 47.32 fluid pack lot # [id number]   CATARACT EXTRACTION W/PHACO Left 03/05/2016   Procedure: CATARACT EXTRACTION PHACO AND INTRAOCULAR LENS PLACEMENT (IOC);  Surgeon: Steven Dingeldein, MD;  Location: [facility] ORS;  Service: Ophthalmology;  Laterality: Left;  US  01:02 AP% 23.2 CDE 26.73 fluid pack lot # [id number]   COMBINED HYSTEROSCOPY DIAGNOSTIC / D&C  2016   DILATION AND CURETTAGE OF UTERUS     dnc     LAPAROSCOPIC HYSTERECTOMY  03/30/15   NASAL SINUS SURGERY     SKIN CANCER EXCISION     UPPER LIP   TONSILECTOMY, ADENOIDECTOMY, BILATERAL MYRINGOTOMY AND TUBES      TONSILLECTOMY       Medications:   Current Facility-Administered Medications:    cephALEXin (KEFLEX) capsule 500 mg, 500 mg, Oral, BID, Siadecki, Sebastian, MD  Current Outpatient Medications:    atorvastatin  (LIPITOR) 80 MG tablet, Take 1 tablet (80 mg total) by mouth daily., Disp: 90 tablet, Rfl: 3   cholecalciferol (VITAMIN D ) 1000 units tablet, Take 1,000 Units by mouth daily., Disp: , Rfl:    DULoxetine  (CYMBALTA ) 30 MG capsule, Take 1 capsule (30 mg total) by mouth daily., Disp: 90 capsule, Rfl: 3   metFORMIN  (GLUCOPHAGE ) 500 MG tablet, TAKE 1 TABLET(500 MG) BY MOUTH DAILY WITH BREAKFAST, Disp: 90 tablet, Rfl: 3   Multiple Vitamin (MULTIVITAMIN) tablet, Take 1 tablet by mouth daily., Disp: , Rfl:    risperiDONE  (RISPERDAL ) 1 MG tablet, TAKE 1 TABLET(1 MG) BY MOUTH AT BEDTIME AS NEEDED, Disp: 90 tablet, Rfl: 0  Allergies: No Known Allergies  Zelda Sharps, NP

## 2024-10-02 NOTE — ED Notes (Signed)
 Pt ambulatory to bathroom without assistance.

## 2024-10-02 NOTE — ED Notes (Signed)
 Lunch provided to pt

## 2024-10-02 NOTE — Discharge Instructions (Signed)
 We have given you antibiotics here to treat your urinary tract infection.  Return to the ER for new, worsening, or persistent back pain, weakness, fever, or any other new or worsening symptoms that concern you.

## 2024-10-02 NOTE — ED Notes (Signed)
 Dinner tray provided to pt

## 2024-10-02 NOTE — ED Notes (Signed)
 Pt given a lunch tray

## 2024-10-02 NOTE — BH Assessment (Signed)
 Comprehensive Clinical Assessment (CCA) Screening, Triage and Referral Note  10/02/2024 Pamela Norman [id number]  Chief Complaint:  Chief Complaint  Patient presents with   Mental Health Problem   Visit Diagnosis: Mental Health Problem  Patient brought from the care facility due to having thoughts her body is distorted and complaining of abdominal pain. Patient states she don't want to be inpatient.  Patient Reported Information How did you hear about us ? Other (Comment)  What Is the Reason for Your Visit/Call Today? Brought to the ER due to delusional thinking  How Long Has This Been Causing You Problems? 1 wk - 1 month  What Do You Feel Would Help You the Most Today? Treatment for Depression or other mood problem   Have You Recently Had Any Thoughts About Hurting Yourself? No  Are You Planning to Commit Suicide/Harm Yourself At This time? No   Have you Recently Had Thoughts About Hurting Someone Pamela Norman? No  Are You Planning to Harm Someone at This Time? No  Explanation: No data recorded  Have You Used Any Alcohol or Drugs in the Past 24 Hours? No  How Long Ago Did You Use Drugs or Alcohol? No data recorded What Did You Use and How Much? No data recorded  Do You Currently Have a Therapist/Psychiatrist? No  Name of Therapist/Psychiatrist: No data recorded  Have You Been Recently Discharged From Any Office Practice or Programs? No  Explanation of Discharge From Practice/Program: No data recorded   CCA Screening Triage Referral Assessment Type of Contact: Face-to-Face  Telemedicine Service Delivery:   Is this Initial or Reassessment?   Date Telepsych consult ordered in [facility]:    Time Telepsych consult ordered in [facility]:    Location of Assessment: [facility] ED  Provider Location: [facility] ED    Collateral Involvement: No data recorded  Does Patient Have a Court Appointed Legal Guardian? No data recorded Name and Contact of Legal Guardian: No data recorded If Minor and  Not Living with Parent(s), Who has Custody? No data recorded Is CPS involved or ever been involved? Never  Is APS involved or ever been involved? Never   Patient Determined To Be At Risk for Harm To Self or Others Based on Review of Patient Reported Information or Presenting Complaint? No  Method: No data recorded Availability of Means: No data recorded Intent: No data recorded Notification Required: No data recorded Additional Information for Danger to Others Potential: No data recorded Additional Comments for Danger to Others Potential: No data recorded Are There Guns or Other Weapons in Your Home? No  Types of Guns/Weapons: No data recorded Are These Weapons Safely Secured?                            No  Who Could Verify You Are Able To Have These Secured: No data recorded Do You Have any Outstanding Charges, Pending Court Dates, Parole/Probation? No data recorded Contacted To Inform of Risk of Harm To Self or Others: No data recorded  Does Patient Present under Involuntary Commitment? No    [place] of Residence: [place]   Patient Currently Receiving the Following Services: Not Receiving Services   Determination of Need: Emergent (2 hours)   Options For Referral: No data recorded  Disposition Recommendation per psychiatric provider: Pending medical clearance.  Pamela DOROTHA Barge MS, LCAS, [facility], [facility] Therapeutic Triage Specialist 10/02/2024 4:43 PM

## 2024-10-02 NOTE — ED Notes (Signed)
 Pt agitated at this time. Stating that things don't feel right in her body. Pt given a warm blanket

## 2024-10-02 NOTE — ED Provider Notes (Signed)
 [facility] Provider Note    Event Date/Time   First MD Initiated Contact with Patient 10/02/24 1039     (approximate)   History   Mental Health Problem   HPI  Pamela Norman is a 83 y.o. female with a history of type 2 diabetes, hyperlipidemia, and psychosis on duloxetine  and risperidone  who presents with concern for an acute psychiatric condition.  She was brought by EMS from [facility].  She is reporting that her body is twisted, and the staff told EMS that she has had similar complaints when having an acute episode of psychosis in the past.  The patient is unable to give any reliable history.  She appears very anxious and upset, stating that her body is turned around and that it's not okay when we try to reassure her and encouraged her to get back into the stretcher so that we can evaluate her.  I reviewed the past medical records.  The patient's most recent outpatient encounter in our system was on 6/6 with primary care for follow-up of her chronic conditions.  She has no recent psychiatric evaluations or hospitalizations.   Physical Exam   Triage Vital Signs: ED Triage Vitals  Encounter Vitals Group     BP 10/02/24 1037 (!) 171/108     Girls Systolic BP Percentile --      Girls Diastolic BP Percentile --      Boys Systolic BP Percentile --      Boys Diastolic BP Percentile --      Pulse Rate 10/02/24 1037 (!) 108     Resp 10/02/24 1037 16     Temp 10/02/24 1039 98.7 F (37.1 C)     Temp Source 10/02/24 1039 Axillary     SpO2 10/02/24 1037 97 %     Weight 10/02/24 1039 178 lb (80.7 kg)     Height 10/02/24 1039 5' 6 (1.676 m)     Head Circumference --      Peak Flow --      Pain Score --      Pain Loc --      Pain Education --      Exclude from Growth Chart --     Most recent vital signs: Vitals:   10/02/24 1500 10/02/24 1509  BP: (!) 124/58 (!) 124/58  Pulse: 76 76  Resp:  17  Temp:  98.4 F (36.9 C)  SpO2: 93%  93%     General: Alert, very anxious appearing, no distress.  CV:  Good peripheral perfusion.  Resp:  Normal effort.  Abd:  No distention.  Other:  Normal gait.  No midline spinal tenderness.  No rashes or cutaneous findings.  Motor intact in all extremities.  EOMI.  PERRLA. Very anxious appearing, yelling at staff.     ED Results / Procedures / Treatments   Labs (all labs ordered are listed, but only abnormal results are displayed) Labs Reviewed  COMPREHENSIVE METABOLIC PANEL WITH GFR - Abnormal; Notable for the following components:      Result Value   Glucose, Bld 136 (*)    BUN 33 (*)    Creatinine, Ser 1.01 (*)    Total Protein 8.2 (*)    GFR, Estimated 56 (*)    All other components within normal limits  URINALYSIS, ROUTINE W REFLEX MICROSCOPIC - Abnormal; Notable for the following components:   Color, Urine AMBER (*)    APPearance CLOUDY (*)    Ketones, ur 5 (*)  Protein, ur 30 (*)    Nitrite POSITIVE (*)    Leukocytes,Ua SMALL (*)    Bacteria, UA MANY (*)    All other components within normal limits  ETHANOL  CBC  URINE DRUG SCREEN, QUALITATIVE ([facility] ONLY)     EKG  ED ECG REPORT I, Waylon Cassis, the attending physician, personally viewed and interpreted this ECG.  Date: 10/02/2024 EKG Time: 1040 Rate: 110 Rhythm: Tachycardia with PVCs QRS Axis: normal Intervals: RBBB ST/T Wave abnormalities: normal Narrative Interpretation: no evidence of acute ischemia    RADIOLOGY  CT head: I independently viewed and interpreted the images; there is no ICH.  Radiology report indicates no acute abnormality.   PROCEDURES:  Critical Care performed: No  Procedures   MEDICATIONS ORDERED IN ED: Medications  fosfomycin (MONUROL) packet 3 g (has no administration in time range)  LORazepam  (ATIVAN ) injection 2 mg (2 mg Intravenous Given 10/02/24 1053)  cefTRIAXone  (ROCEPHIN ) 1 g in sodium chloride  0.9 % 100 mL IVPB (0 g Intravenous Stopped 10/02/24 1427)      IMPRESSION / MDM / ASSESSMENT AND PLAN / ED COURSE  I reviewed the triage vital signs and the nursing notes.  83 year old female with PMH as noted above including documented history of psychosis, on medication, presents with concern for an acute psychiatric episode.  On examination, she is extremely anxious appearing, labile, yelling at staff, and complaining that her body is twisted and turned around.  Physical exam is unremarkable for acute findings.  Neurologic exam is nonfocal.  She is hypertensive and borderline tachycardic.    Differential diagnosis includes, but is not limited to, acute psychosis, delirium due to CNS or other medical issue.  Will obtain CT head, lab workup including UA, and obtain psychiatry and TTS consults.  Due to her anxiety, agitation and inability to cooperate with exam, the patient required a therapeutic dose of Ativan .  Patient's presentation is most consistent with severe exacerbation of chronic illness.  The patient is on the cardiac monitor to evaluate for evidence of arrhythmia and/or significant heart rate changes.   ----------------------------------------- 3:09 PM on 10/02/2024 -----------------------------------------  The patient's vital signs have normalized.  She immediately became calm after the Ativan  and now remains calm.  She is not complaining of any twisting sensation or back pain.  She is alert and oriented.  CMP and CBC showed no acute findings.  Urinalysis does show findings concerning for UTI.  However there is no evidence of systemic infection or sepsis.  The patient is not altered.  I consulted and discussed the case with NP Claudene from psychiatry who has evaluated the patient.  She did offer the patient voluntary admission, although there is no indication for IVC.  The patient declined and prefers to go home.  She demonstrates appropriate decision-making capacity.  On my reassessment the patient remains alert and oriented.  She  confirms that she feels comfortable going home.  I switched out the Keflex that I had written (anticipate that the patient may stay in the ED for some time awaiting psych evaluation) and we have given fosfomycin so as not to have to be concerned about whether the patient will be compliant with outpatient antibiotics.  At this time there is no evidence of acute danger to self or others.  The patient is stable for discharge.  Return precautions have been provided, and she expressed understanding.    FINAL CLINICAL IMPRESSION(S) / ED DIAGNOSES   Final diagnoses:  Behavior concern  Urinary tract infection  without hematuria, site unspecified     Rx / DC Orders   ED Discharge Orders     None        Note:  This document was prepared using Dragon voice recognition software and may include unintentional dictation errors.    Jacolyn Pae, MD 10/02/24 1511

## 2024-10-02 NOTE — ED Triage Notes (Signed)
 Arrived by [facility] from [facility]. Patient keeps reporting her body is twisted. Staff reports she states her body is twisted and turned when she is having a psych episode.   EMS vitals: 156/87 b/p 97HR 98% RA 98.2oral 122CBG

## 2024-10-02 NOTE — ED Notes (Signed)
 Patient transported to CT

## 2024-10-08 ENCOUNTER — Other Ambulatory Visit: Payer: Self-pay

## 2024-10-08 ENCOUNTER — Emergency Department
Admission: EM | Admit: 2024-10-08 | Discharge: 2024-10-09 | Disposition: A | Discharge status: Home / Self Care | Attending: Emergency Medicine | Admitting: Emergency Medicine

## 2024-10-08 DIAGNOSIS — R4182 Altered mental status, unspecified: Secondary | ICD-10-CM | POA: Insufficient documentation

## 2024-10-08 DIAGNOSIS — B9689 Other specified bacterial agents as the cause of diseases classified elsewhere: Secondary | ICD-10-CM | POA: Diagnosis not present

## 2024-10-08 DIAGNOSIS — F22 Delusional disorders: Secondary | ICD-10-CM | POA: Diagnosis present

## 2024-10-08 DIAGNOSIS — F29 Unspecified psychosis not due to a substance or known physiological condition: Secondary | ICD-10-CM | POA: Insufficient documentation

## 2024-10-08 DIAGNOSIS — Z8542 Personal history of malignant neoplasm of other parts of uterus: Secondary | ICD-10-CM | POA: Insufficient documentation

## 2024-10-08 DIAGNOSIS — R000 Tachycardia, unspecified: Secondary | ICD-10-CM | POA: Diagnosis not present

## 2024-10-08 DIAGNOSIS — Z85828 Personal history of other malignant neoplasm of skin: Secondary | ICD-10-CM | POA: Diagnosis not present

## 2024-10-08 DIAGNOSIS — N390 Urinary tract infection, site not specified: Secondary | ICD-10-CM | POA: Insufficient documentation

## 2024-10-08 MED ORDER — DROPERIDOL 2.5 MG/ML IJ SOLN
2.5000 mg | Freq: Once | INTRAMUSCULAR | Status: AC
  Administered 2024-10-08: 2.5 mg via INTRAVENOUS
  Filled 2024-10-08: qty 2

## 2024-10-08 MED ORDER — LORAZEPAM 2 MG/ML IJ SOLN
1.0000 mg | Freq: Once | INTRAMUSCULAR | Status: AC
  Administered 2024-10-08: 1 mg via INTRAVENOUS
  Filled 2024-10-08: qty 1

## 2024-10-08 MED ORDER — DROPERIDOL 2.5 MG/ML IJ SOLN
2.5000 mg | Freq: Once | INTRAMUSCULAR | Status: DC
Start: 2024-10-08 — End: 2024-10-08

## 2024-10-08 NOTE — ED Provider Notes (Addendum)
 [facility] Provider Note    Event Date/Time   First MD Initiated Contact with Patient 10/08/24 2327     (approximate)   History   Altered Mental Status   HPI  Pamela Norman is a 83 y.o. female   Past medical history of CVA, endometrial cancer status post hysterectomy, hyperlipidemia, squamous cell carcinoma, history of psychosis on risperidone , here with altered mental status.  Quite agitated, with a chief complaint of things are twisted, they are not in the right place, look at my legs, the middle part is missing.  Given her altered mental status she is not able to elaborate further on my review of systems and questioning.  Of note she had a similar occurrence about last week when she came to the emergency department complaining of similar feelings, settled out with IV Ativan , and had been diagnosed with urinary tract infection, evaluated by psychiatry team and deemed appropriate for discharge.  External Medical Documents Reviewed: Previous hospital and psychiatry notes.      Physical Exam   Triage Vital Signs: ED Triage Vitals [10/08/24 2321]  Encounter Vitals Group     BP      Girls Systolic BP Percentile      Girls Diastolic BP Percentile      Boys Systolic BP Percentile      Boys Diastolic BP Percentile      Pulse      Resp      Temp      Temp src      SpO2 97 %     Weight      Height      Head Circumference      Peak Flow      Pain Score      Pain Loc      Pain Education      Exclude from Growth Chart     Most recent vital signs: Vitals:   10/09/24 0100 10/09/24 0200  BP: (!) 165/93 (!) 169/91  Pulse: 79 81  Resp: 16 (!) 24  Temp:    SpO2: 97% 97%    General: Awake, yelling out at staff swatting my hand occasionally during examination. CV:  Good peripheral perfusion.  Resp:  Normal effort.  Abd:  No distention.  Soft nontender nondistended nonfocal nonperitoneal abdominal exam. Other:  Moving both upper extremities  with full active range of motion, no rigidity, not seen moving lower extremities voluntarily but when I moved to assess her legs she will retract bilaterally.  No obvious signs of trauma to the head torso abdomen or bilateral upper or lower extremities.  No fever.  No meningismus, neck with full range  motion.   ED Results / Procedures / Treatments   Labs (all labs ordered are listed, but only abnormal results are displayed) Labs Reviewed  COMPREHENSIVE METABOLIC PANEL WITH GFR - Abnormal; Notable for the following components:      Result Value   Glucose, Bld 145 (*)    BUN 29 (*)    Creatinine, Ser 1.01 (*)    GFR, Estimated 56 (*)    All other components within normal limits  TSH - Abnormal; Notable for the following components:   TSH 4.918 (*)    All other components within normal limits  URINALYSIS, W/ REFLEX TO CULTURE (INFECTION SUSPECTED) - Abnormal; Notable for the following components:   Color, Urine YELLOW (*)    APPearance HAZY (*)    Ketones, ur 5 (*)  All other components within normal limits  CBC WITH DIFFERENTIAL/PLATELET  MAGNESIUM   T4, FREE  TROPONIN I (HIGH SENSITIVITY)     I ordered and reviewed the above labs they are notable for no evidence of urine infection on urinalysis, cell counts and electrolytes electrolytes unremarkable.  EKG  ED ECG REPORT I, Ginnie Shams, the attending physician, personally viewed and interpreted this ECG.   Date: 10/08/2024  EKG Time: 2345  Rate: 114  Rhythm: sinus tachycardia  Axis: nl  Intervals:rbbb  ST&T Change: no stemi   PROCEDURES:  Critical Care performed: Yes, see critical care procedure note(s)  .Critical Care  Performed by: Shams Ginnie, MD Authorized by: Shams Ginnie, MD   Critical care provider statement:    Critical care time (minutes):  30   Critical care was time spent personally by me on the following activities:  Development of treatment plan with patient or surrogate, discussions with  consultants, evaluation of patient's response to treatment, examination of patient, ordering and review of laboratory studies, ordering and review of radiographic studies, ordering and performing treatments and interventions, pulse oximetry, re-evaluation of patient's condition and review of old charts    MEDICATIONS ORDERED IN ED: Medications  LORazepam  (ATIVAN ) injection 1 mg (1 mg Intravenous Given 10/08/24 2341)  droperidol (INAPSINE) 2.5 MG/ML injection 2.5 mg (2.5 mg Intravenous Given 10/08/24 2341)     IMPRESSION / MDM / ASSESSMENT AND PLAN / ED COURSE  I reviewed the triage vital signs and the nursing notes.                                Patient's presentation is most consistent with acute presentation with potential threat to life or bodily function.  Differential diagnosis includes, but is not limited to, acute decompensated psychiatric illness, infection, trauma, metabolic derangement, considered but less likely serotonin syndrome or NMS   The patient is on the cardiac monitor to evaluate for evidence of arrhythmia and/or significant heart rate changes.  MDM:    Acutely agitated elderly woman with a history of psychosis on risperidone  here with a similar occurrence last week diagnosed with UTI at that time and discharged after psychiatric evaluation deemed not an IVC or psychiatric hospitalization candidate.  Here with similar complaints of twisting body with a relatively benign exam limited by her agitated state not convincing for trauma, meningitis, or NMS/serotonin syndrome.    Given her agitation, for her own safety and for the safety of our staff to facilitate evaluation and management will medicate with a therapeutic dose of sedatives through the IV  -- On reassessment patient much more calm and more cooperative with examination though continuing to endorse feelings of my limbs twisting inside out, a portion of me is missing. Labs and urinalysis largely  unremarkable today.  Better able to assess for clonus, negative at the ankles bilaterally, and afebrile.  I think more likely acute psychiatric decompensated illness rather than organic cause like infection or trauma, toxidrome or withdrawal given unremarkable lab analysis, and largely unremarkable physical exam today.  Attempted to call her next of kin listed on contacts tonight was unsuccessful.  I will keep her in the ED for psychiatric evaluation.        FINAL CLINICAL IMPRESSION(S) / ED DIAGNOSES   Final diagnoses:  Altered mental status, unspecified altered mental status type     Rx / DC Orders   ED Discharge Orders     None  Note:  This document was prepared using Dragon voice recognition software and may include unintentional dictation errors.    Cyrena Mylar, MD 10/09/24 9866    Cyrena Mylar, MD 10/09/24 9865    Cyrena Mylar, MD 10/09/24 [id number]

## 2024-10-08 NOTE — ED Provider Notes (Incomplete)
 [facility] Provider Note    Event Date/Time   First MD Initiated Contact with Patient 10/08/24 2327     (approximate)   History   Altered Mental Status   HPI  TANIQUA ISSA is a 83 y.o. female   Past medical history of CVA, endometrial cancer status post hysterectomy, hyperlipidemia, squamous cell carcinoma, history of psychosis on risperidone , here with altered mental status.  Quite agitated, with a chief complaint of things are twisted, they are not in the right place, look at my legs, the middle part is missing.  Given her altered mental status she is not able to elaborate further on my review of systems and questioning.  Of note she had a similar occurrence about last week when she came to the emergency department complaining of similar feelings, settled out with IV Ativan , and had been diagnosed with urinary tract infection, evaluated by psychiatry team and deemed appropriate for discharge.  External Medical Documents Reviewed: Previous hospital and psychiatry notes.      Physical Exam   Triage Vital Signs: ED Triage Vitals [10/08/24 2321]  Encounter Vitals Group     BP      Girls Systolic BP Percentile      Girls Diastolic BP Percentile      Boys Systolic BP Percentile      Boys Diastolic BP Percentile      Pulse      Resp      Temp      Temp src      SpO2 97 %     Weight      Height      Head Circumference      Peak Flow      Pain Score      Pain Loc      Pain Education      Exclude from Growth Chart     Most recent vital signs: Vitals:   10/08/24 2321 10/08/24 2330  Pulse:  90  Resp:  20  Temp:  98.4 F (36.9 C)  SpO2: 97% 100%    General: Awake, yelling out at staff swatting my hand occasionally during examination. CV:  Good peripheral perfusion.  Resp:  Normal effort.  Abd:  No distention.  Soft nontender nondistended nonfocal nonperitoneal abdominal exam. Other:  Moving both upper extremities with full active  range of motion, no rigidity, not seen moving lower extremities voluntarily but when I moved to assess her legs she will retract bilaterally.  No obvious signs of trauma to the head torso abdomen or bilateral upper or lower extremities.  No fever.  No meningismus, neck with full Minor motion.   ED Results / Procedures / Treatments   Labs (all labs ordered are listed, but only abnormal results are displayed) Labs Reviewed  COMPREHENSIVE METABOLIC PANEL WITH GFR  CBC WITH DIFFERENTIAL/PLATELET  MAGNESIUM   TSH  T4, FREE  URINALYSIS, W/ REFLEX TO CULTURE (INFECTION SUSPECTED)  TROPONIN I (HIGH SENSITIVITY)     I ordered and reviewed the above labs they are notable for ***  EKG  ED ECG REPORT I, Ginnie Shams, the attending physician, personally viewed and interpreted this ECG.   Date: 10/08/2024  EKG Time: ***  Rate: ***  Rhythm: {ekg findings:315101}  Axis: ***  Intervals:{conduction defects:17367}  ST&T Change: ***    RADIOLOGY I independently reviewed and interpreted *** I also reviewed radiologist's formal read.   PROCEDURES:  Critical Care performed: {CriticalCareYesNo:19197::Yes, see critical care procedure note(s),No}  Procedures   MEDICATIONS ORDERED IN ED: Medications  LORazepam  (ATIVAN ) injection 1 mg (has no administration in time range)  droperidol (INAPSINE) 2.5 MG/ML injection 2.5 mg (has no administration in time range)    External physician / consultants:  I spoke with *** regarding care plan for this patient.   IMPRESSION / MDM / ASSESSMENT AND PLAN / ED COURSE  I reviewed the triage vital signs and the nursing notes.                                Patient's presentation is most consistent with acute presentation with potential threat to life or bodily function.  Differential diagnosis includes, but is not limited to, acute decompensated psychiatric illness, infection, trauma, metabolic derangement, considered but less likely serotonin  syndrome or NMS   The patient is on the cardiac monitor to evaluate for evidence of arrhythmia and/or significant heart rate changes.  MDM:    Acutely agitated elderly woman with a history of psychosis on risperidone  here with a similar occurrence last week diagnosed with UTI at that time and discharged after psychiatric evaluation deemed not an IVC or psychiatric hospitalization candidate.  Here with similar complaints of twisting body with a relatively benign exam limited by her agitated state not convincing for trauma, meningitis, or NMS/serotonin syndrome.  Will medicate with antipsychotic and anxiety medications through the IV, as well as obtaining labs and urinalysis today.  I considered hospitalization for admission or observation ***        FINAL CLINICAL IMPRESSION(S) / ED DIAGNOSES   Final diagnoses:  None     Rx / DC Orders   ED Discharge Orders     None        Note:  This document was prepared using Dragon voice recognition software and may include unintentional dictation errors.

## 2024-10-08 NOTE — ED Triage Notes (Signed)
 Pt BIB [facility] from home for AMS. Pt states that her body is twisted and my body is not right. Pt states you're killing me.

## 2024-10-09 ENCOUNTER — Encounter: Payer: Self-pay | Admitting: Psychiatry

## 2024-10-09 DIAGNOSIS — F22 Delusional disorders: Secondary | ICD-10-CM | POA: Diagnosis not present

## 2024-10-09 LAB — TSH: TSH: 4.918 u[IU]/mL — ABNORMAL HIGH (ref 0.350–4.500)

## 2024-10-09 LAB — URINALYSIS, W/ REFLEX TO CULTURE (INFECTION SUSPECTED)
Bacteria, UA: NONE SEEN
Bilirubin Urine: NEGATIVE
Glucose, UA: NEGATIVE mg/dL
Hgb urine dipstick: NEGATIVE
Ketones, ur: 5 mg/dL — AB
Leukocytes,Ua: NEGATIVE
Nitrite: NEGATIVE
Protein, ur: NEGATIVE mg/dL
Specific Gravity, Urine: 1.023 (ref 1.005–1.030)
WBC, UA: 0 WBC/hpf (ref 0–5)
pH: 5 (ref 5.0–8.0)

## 2024-10-09 LAB — CBC WITH DIFFERENTIAL/PLATELET
Abs Immature Granulocytes: 0.02 K/uL (ref 0.00–0.07)
Basophils Absolute: 0.1 K/uL (ref 0.0–0.1)
Basophils Relative: 1 %
Eosinophils Absolute: 0.1 K/uL (ref 0.0–0.5)
Eosinophils Relative: 2 %
HCT: 40.6 % (ref 36.0–46.0)
Hemoglobin: 13 g/dL (ref 12.0–15.0)
Immature Granulocytes: 0 %
Lymphocytes Relative: 29 %
Lymphs Abs: 2.3 K/uL (ref 0.7–4.0)
MCH: 30.3 pg (ref 26.0–34.0)
MCHC: 32 g/dL (ref 30.0–36.0)
MCV: 94.6 fL (ref 80.0–100.0)
Monocytes Absolute: 0.8 K/uL (ref 0.1–1.0)
Monocytes Relative: 11 %
Neutro Abs: 4.5 K/uL (ref 1.7–7.7)
Neutrophils Relative %: 57 %
Platelets: 257 K/uL (ref 150–400)
RBC: 4.29 MIL/uL (ref 3.87–5.11)
RDW: 13.3 % (ref 11.5–15.5)
WBC: 7.7 K/uL (ref 4.0–10.5)
nRBC: 0 % (ref 0.0–0.2)

## 2024-10-09 LAB — COMPREHENSIVE METABOLIC PANEL WITH GFR
ALT: 17 U/L (ref 0–44)
AST: 22 U/L (ref 15–41)
Albumin: 4 g/dL (ref 3.5–5.0)
Alkaline Phosphatase: 64 U/L (ref 38–126)
Anion gap: 12 (ref 5–15)
BUN: 29 mg/dL — ABNORMAL HIGH (ref 8–23)
CO2: 24 mmol/L (ref 22–32)
Calcium: 9 mg/dL (ref 8.9–10.3)
Chloride: 107 mmol/L (ref 98–111)
Creatinine, Ser: 1.01 mg/dL — ABNORMAL HIGH (ref 0.44–1.00)
GFR, Estimated: 56 mL/min — ABNORMAL LOW (ref >60)
Glucose, Bld: 145 mg/dL — ABNORMAL HIGH (ref 70–99)
Potassium: 4.4 mmol/L (ref 3.5–5.1)
Sodium: 143 mmol/L (ref 135–145)
Total Bilirubin: 0.8 mg/dL (ref 0.0–1.2)
Total Protein: 7.1 g/dL (ref 6.5–8.1)

## 2024-10-09 LAB — T4, FREE: Free T4: 0.94 ng/dL (ref 0.61–1.12)

## 2024-10-09 LAB — MAGNESIUM: Magnesium: 2.3 mg/dL (ref 1.7–2.4)

## 2024-10-09 LAB — TROPONIN I (HIGH SENSITIVITY): Troponin I (High Sensitivity): 4 ng/L (ref <18)

## 2024-10-09 MED ORDER — RISPERIDONE 1 MG PO TABS: 1.0000 mg | ORAL_TABLET | Freq: Every day | ORAL | 2 refills | Status: DC

## 2024-10-09 NOTE — ED Notes (Signed)
 [facility]- [facility] notified of patient to be discharged via safe transport

## 2024-10-09 NOTE — ED Notes (Signed)
 Safe  transport   called for  transport  to  [facility] in  [place]

## 2024-10-09 NOTE — ED Notes (Signed)
 Pt ambulatory to restroom

## 2024-10-09 NOTE — BH Assessment (Signed)
 Comprehensive Clinical Assessment (CCA) Note  10/09/2024 Pamela Norman [id number]  Chief Complaint: Patient is a 83 year old female presenting to [facility] ED voluntarily. Per triage note Pt BIB [facility] from home for AMS. Pt states that her body is twisted and my body is not right. Pt states you're killing me. During assessment patient appears alert and oriented x4, calm and cooperative. Patient reports my body is twisted, this time it's real bad, I feel like my body is going backwards, I'm in physical pain. Patient reports currently living at a independent living facility [facility], she reports good sleep and a good appetite, she denies any AH/VH and does not seem to be responding to any internal or external stimuli, she mainly only complains about getting up and out of the bed to move around so that she doesn't get bed sores. Patient presented here recently on 10/24 for similar presentation, she was provided Ativan  which help to calm her down and ceased any complaints of twisting and her Urinalysis showed some findings for UTI which seems pretty similar to her UA today. Patient denies SI/HI. Chief Complaint  Patient presents with   Altered Mental Status   Visit Diagnosis: Altered Mental Status. UTI    CCA Screening, Triage and Referral (STR)  Patient Reported Information How did you hear about us ? Self  Referral name: No data recorded Referral phone number: No data recorded  Whom do you see for routine medical problems? No data recorded Practice/Facility Name: No data recorded Practice/Facility Phone Number: No data recorded Name of Contact: No data recorded Contact Number: No data recorded Contact Fax Number: No data recorded Prescriber Name: No data recorded Prescriber Address (if known): No data recorded  What Is the Reason for Your Visit/Call Today? Pt BIB [facility] from home for AMS. Pt states that her body is twisted and my body is not right. Pt states you're killing  me.  How Long Has This Been Causing You Problems? > than 6 months  What Do You Feel Would Help You the Most Today? Treatment for Depression or other mood problem   Have You Recently Been in Any Inpatient Treatment (Hospital/Detox/Crisis Center/28-Day Program)? No data recorded Name/Location of Program/Hospital:No data recorded How Long Were You There? No data recorded When Were You Discharged? No data recorded  Have You Ever Received Services From [facility] Before? No data recorded Who Do You See at [facility]? No data recorded  Have You Recently Had Any Thoughts About Hurting Yourself? No  Are You Planning to Commit Suicide/Harm Yourself At This time? No   Have you Recently Had Thoughts About Hurting Someone [place]? No  Explanation: No data recorded  Have You Used Any Alcohol or Drugs in the Past 24 Hours? No  How Long Ago Did You Use Drugs or Alcohol? No data recorded What Did You Use and How Much? No data recorded  Do You Currently Have a Therapist/Psychiatrist? No  Name of Therapist/Psychiatrist: No data recorded  Have You Been Recently Discharged From Any Office Practice or Programs? No  Explanation of Discharge From Practice/Program: No data recorded    CCA Screening Triage Referral Assessment Type of Contact: Face-to-Face  Is this Initial or Reassessment? No data recorded Date Telepsych consult ordered in [facility]:  No data recorded Time Telepsych consult ordered in [facility]:  No data recorded  Patient Reported Information Reviewed? No data recorded Patient Left Without Being Seen? No data recorded Reason for Not Completing Assessment: No data recorded  Collateral Involvement:  No data recorded  Does Patient Have a Court Appointed Legal Guardian? No data recorded Name and Contact of Legal Guardian: No data recorded If Minor and Not Living with Parent(s), Who has Custody? No data recorded Is CPS involved or ever been involved? Never  Is APS involved or ever been  involved? Never   Patient Determined To Be At Risk for Harm To Self or Others Based on Review of Patient Reported Information or Presenting Complaint? No  Method: No data recorded Availability of Means: No data recorded Intent: No data recorded Notification Required: No data recorded Additional Information for Danger to Others Potential: No data recorded Additional Comments for Danger to Others Potential: No data recorded Are There Guns or Other Weapons in Your Home? No  Types of Guns/Weapons: No data recorded Are These Weapons Safely Secured?                            No  Who Could Verify You Are Able To Have These Secured: No data recorded Do You Have any Outstanding Charges, Pending Court Dates, Parole/Probation? No data recorded Contacted To Inform of Risk of Harm To Self or Others: No data recorded  Location of Assessment: [facility] ED   Does Patient Present under Involuntary Commitment? No  IVC Papers Initial File Date: No data recorded  [place] of Residence: [place]   Patient Currently Receiving the Following Services: Not Receiving Services   Determination of Need: Emergent (2 hours)   Options For Referral: No data recorded    CCA Biopsychosocial Intake/Chief Complaint:  No data recorded Current Symptoms/Problems: No data recorded  Patient Reported Schizophrenia/Schizoaffective Diagnosis in Past: No   Strengths: Patient is able to communicate her needs  Preferences: No data recorded Abilities: No data recorded  Type of Services Patient Feels are Needed: No data recorded  Initial Clinical Notes/Concerns: No data recorded  Mental Health Symptoms Depression:  Change in energy/activity   Duration of Depressive symptoms: Greater than two weeks   Mania:  None   Anxiety:   Fatigue; Irritability; Restlessness   Psychosis:  Delusions   Duration of Psychotic symptoms: Less than six months   Trauma:  None   Obsessions:  Poor insight; Recurrent &  persistent thoughts/impulses/images   Compulsions:  Absent insight/delusional   Inattention:  None   Hyperactivity/Impulsivity:  None   Oppositional/Defiant Behaviors:  None   Emotional Irregularity:  None   Other Mood/Personality Symptoms:  No data recorded   Mental Status Exam Appearance and self-care  Stature:  Average   Weight:  Overweight   Clothing:  Casual   Grooming:  Normal   Cosmetic use:  None   Posture/gait:  Normal   Motor activity:  Not Remarkable   Sensorium  Attention:  Normal   Concentration:  Normal   Orientation:  X5   Recall/memory:  Normal   Affect and Mood  Affect:  Appropriate   Mood:  Other (Comment)   Relating  Eye contact:  Normal   Facial expression:  Responsive   Attitude toward examiner:  Cooperative   Thought and Language  Speech flow: Clear and Coherent   Thought content:  Appropriate to Mood and Circumstances   Preoccupation:  None   Hallucinations:  None   Organization:  No data recorded  [employer] of Knowledge:  Good   Intelligence:  Average   Abstraction:  Normal   Judgement:  Good   Reality Testing:  Realistic  Insight:  Fair   Decision Making:  Normal   Social Functioning  Social Maturity:  Responsible   Social Judgement:  Normal   Stress  Stressors:  Other (Comment)   Coping Ability:  Exhausted   Skill Deficits:  None   Supports:  Family     Religion: Religion/Spirituality Are You A Religious Person?: No  Leisure/Recreation: Leisure / Recreation Do You Have Hobbies?: No  Exercise/Diet: Exercise/Diet Do You Exercise?: No Have You Gained or Lost A Significant Amount of Weight in the Past Six Months?: No Do You Follow a Special Diet?: No Do You Have Any Trouble Sleeping?: No   CCA Employment/Education Employment/Work Situation: Employment / Work Situation Employment Situation: Retired Has Patient ever Been in Equities Trader?: No  Education: Education Is  Patient Currently Attending School?: No Did You Have An Individualized Education Program (IIEP): No Did You Have Any Difficulty At [employer]?: No Patient's Education Has Been Impacted by Current Illness: No   CCA Family/Childhood History Family and Relationship History: Family history Marital status: Single Does patient have children?: No  Childhood History:  Childhood History Did patient suffer any verbal/emotional/physical/sexual abuse as a child?: No Did patient suffer from severe childhood neglect?: No Has patient ever been sexually abused/assaulted/raped as an adolescent or adult?: No Was the patient ever a victim of a crime or a disaster?: No Witnessed domestic violence?: No Has patient been affected by domestic violence as an adult?: No  Child/Adolescent Assessment:     CCA Substance Use Alcohol/Drug Use: Alcohol / Drug Use Pain Medications: None Reported Prescriptions: None Reported Over the Counter: None Reported History of alcohol / drug use?: No history of alcohol / drug abuse (None Reported) Longest period of sobriety (when/how long): None Reported Negative Consequences of Use:  (None Reported) Withdrawal Symptoms:  (None Reported)                         ASAM's:  Six Dimensions of Multidimensional Assessment  Dimension 1:  Acute Intoxication and/or Withdrawal Potential:      Dimension 2:  Biomedical Conditions and Complications:      Dimension 3:  Emotional, Behavioral, or Cognitive Conditions and Complications:     Dimension 4:  Readiness to Change:     Dimension 5:  Relapse, Continued use, or Continued Problem Potential:     Dimension 6:  Recovery/Living Environment:     ASAM Severity Score:    ASAM Recommended Level of Treatment:     Substance use Disorder (SUD)    Recommendations for Services/Supports/Treatments:    DSM5 Diagnoses: Patient Active Problem List   Diagnosis Date Noted   Rectal prolapse 02/17/2024   Elevated TSH  02/22/2021   Prolonged QT interval 01/27/2021   Overweight 05/13/2019   Skin lesions 12/12/2017   Elevated blood pressure reading 12/12/2017   Diabetes mellitus without complication (HCC) 11/09/2017   History of psychosis 09/17/2016   Osteopenia 09/17/2016   Vitamin D  deficiency 09/17/2016   H/O: CVA (cerebrovascular accident) 08/02/2015   External hemorrhoids without complication 08/02/2015   History of endometrial cancer 05/04/2015   Hyperlipidemia 05/21/2014    Patient Centered Plan: Patient is on the following Treatment Plan(s):  Anxiety   Referrals to Alternative Service(s): Referred to Alternative Service(s):   Place:   Date:   Time:    Referred to Alternative Service(s):   Place:   Date:   Time:    Referred to Alternative Service(s):   Place:   Date:  Time:    Referred to Alternative Service(s):   Place:   Date:   Time:      @[facility] @  [employer], LCAS-A

## 2024-10-09 NOTE — Discharge Instructions (Signed)
 Please begin taking Risperdal  each night at bedtime (instead of as needed).  Return to the emergency department for any symptoms concerning to yourself.

## 2024-10-09 NOTE — ED Notes (Signed)
 This NT changed the bed linens and patient's gown after a period of incontinence. Patient stated she did not want to use bedpan, she would rather get up. This NT ambulated with the patient to the en suite restroom; patient had a mostly steady gait with some bouts of mild weakness. Patient used the restroom without incident, returned to bed. Gave the patient a warm blanket for comfort. No acute needs at this time.

## 2024-10-09 NOTE — Consult Note (Cosign Needed Addendum)
 [facility] Consult Note  Patient Name: Pamela Norman MRN: [id number] DOB: [birth year] DATE OF Consult: 10/09/2024  PRIMARY PSYCHIATRIC DIAGNOSES  1.  Delusional Disorder, acute exacerbation     RECOMMENDATIONS  Inpt psych admission recommended:    [] YES       [x]  NO   Pt has long hx of delusional disorder, review of her remote psychiatric records, she has delusions of her body being twisted and out of alignment dated back at least tas early as 2016 if not earlier. Is possible that maybe the UTI and recent Antibiotics may have exacerbated  her symptoms recently; however, concerns to if she is being most compliant with meds as well. II recommend changing her risperidone  from PRN to scheduled dosing at bedtime and reinforce her need to take it nightly instead of prn  she is cleared from psychiatric standpoint for d/c    Non-Medication recommendations:  Recommend follow up with PCP regarding abnormal TSH which may be contributor to MH symptomology     Communication: Treatment team members (and family members if applicable) who were involved in treatment/care discussions and planning, and with whom we spoke or engaged with via secure text/chat, include the following: epic chat Rachael Nurse, Tyronne LCAS Dr Cyrena   I have discussed my assessment and treatment recommendations with the patient. Possible medication side effects/risks/benefits of current regimen.   Importance of medication adherence for medication to be beneficial.   Follow-Up Telepsychiatry C/L services:            []  We will continue to follow this patient with you.             [x]  Will sign off for now. Please re-consult our service as necessary.  Thank you for involving us  in the care of this patient. If you have any additional questions or concerns, please call [phone] and ask for me or the provider on-call.  TELEPSYCHIATRY ATTESTATION & CONSENT  As the provider for this telehealth consult, I attest that I  verified the patients identity using two separate identifiers, introduced myself to the patient, provided my credentials, disclosed my location, and performed this encounter via a HIPAA-compliant, real-time, face-to-face, two-way, interactive audio and video platform and with the full consent and agreement of the patient (or guardian as applicable.)  Patient physical location: [place] [facility]. Telehealth provider physical location: home office in state of [place]  Video start time: 07:25 am  (Central Time) Video end time: 07:39 am  (Central Time)  IDENTIFYING DATA  Pamela Norman is a 83 y.o. year-old female for whom a psychiatric consultation has been ordered by the primary provider. The patient was identified using two separate identifiers.  CHIEF COMPLAINT/REASON FOR CONSULT  I was having a real hard time last night and it scared me   HISTORY OF PRESENT ILLNESS (HPI)  The patient presented to ED via EMS for Altered mental status  Hx of treatment for  Delusional Disorder   Currently prescribed: risperidone  duloxetine     Per remote hx record review pt with hx of delusional disorder, chronic thoughts of her body being twisted or out of alignment (see psychiatry notes: this delusional phenomenon for pt dates back as earlier as 2016)   She admits to hx of stopping and starting medications sometimes I think I don't need them but I see I do, she reports she has been taking consistently recently.  Review of record indicates risperidone  may be prn instead of scheduled dosing.   Today, client denied symptoms  of depression  denied anergia, anhedonia, amotivation, has situational anxiety, frequent worry,   no reported panic symptoms, no reported obsessive/compulsive behaviors. Client denies active SI/HI ideations, plans or intent. There is  evidence of chronic  delusional thinking.  No AVH Client no evident episodes of hypomania, hyperactivity, erratic/excessive spending, involvement in dangerous activities,  self-inflated ego, grandiosity, or promiscuity.   Reviewed active medication list/reviewed labs. Obtained Collateral information from medical record.   EKG   QTC 495  pt with hx of noted prolonged QT interval since at least 2022  PAST PSYCHIATRIC HISTORY    Previous Psychiatric Hospitalizations: 2016. 2022 Previous Detox/Residential treatments:denied Outpt treatment:   [facility]  NP Gretel  Previous psychotropic medication trials: diazepam   Previous mental health diagnosis per client/MEDICAL RECORD NUMBERDelusional disorder   Suicide attempts/self-injurious behaviors:  denied history of suicidal/homicidal ideation/gestures; denied history of self-harm behaviors  History of trauma/abuse/neglect/exploitation:  denied  PAST MEDICAL HISTORY  Past Medical History:  Diagnosis Date   Basal cell carcinoma 02/17/2024   Left inframammry. Nodular. Excised 04/22/24   Cataract    Cervical stenosis of spine    Endometrial cancer (HCC) 2016   Grade 1,total laparoscopic hysterectomy   H/O: CVA (cerebrovascular accident) 08/02/2015   History of colonoscopy 2014   within normal limits   History of ovarian cyst 01/2015   Hyperlipidemia    Rectal prolapse    Squamous cell carcinoma    face     HOME MEDICATIONS  Facility Ordered Medications  Medication   [COMPLETED] LORazepam  (ATIVAN ) injection 1 mg   [COMPLETED] droperidol  (INAPSINE ) 2.5 MG/ML injection 2.5 mg   PTA Medications  Medication Sig   Multiple Vitamin (MULTIVITAMIN) tablet Take 1 tablet by mouth daily.   cholecalciferol (VITAMIN D ) 1000 units tablet Take 1,000 Units by mouth daily.   atorvastatin  (LIPITOR) 80 MG tablet Take 1 tablet (80 mg total) by mouth daily.   metFORMIN  (GLUCOPHAGE ) 500 MG tablet TAKE 1 TABLET(500 MG) BY MOUTH DAILY WITH BREAKFAST   DULoxetine  (CYMBALTA ) 30 MG capsule Take 1 capsule (30 mg total) by mouth daily.   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    Hobbies:  participates in activities at community center    Have you used/abused any of the following (include frequency/amt/last use):  Denied tobacco, illicit drugs and alcohol   UDS not available during this encounter On 10/02/24 UDS negative and BAL<15        FAMILY HISTORY  Family History  Problem Relation Age of Onset   Colon cancer Father        older age onset   COPD Father    COPD Mother    Breast cancer Neg Hx    Ovarian cancer Neg Hx    Diabetes Neg Hx    Heart disease Neg Hx    Family Psychiatric History (if known):  denied  MENTAL STATUS EXAM (MSE)  Mental Status Exam: General Appearance:  Casual  Orientation:  Full (Time, Place, and Person)  Memory:  Immediate;   Good Recent;   Good Remote;   Good  Concentration:  Concentration: Good  Recall:  Fair  Attention  Good  Eye Contact:  Good  Speech:  Clear and Coherent  Language:  Good  Volume:  Normal  Mood: euthymic, mild anxiety   Affect:  Appropriate and Congruent  Thought Process:  Goal Directed  Thought Content:  Logical  Suicidal Thoughts:  No  Homicidal Thoughts:  No  Judgement:  Fair  Insight:  Fair  Psychomotor Activity:  Normal  Akathisia:  Negative  Fund of Knowledge:  Good    Assets:  Communication Skills Desire for Improvement Financial Resources/Insurance Housing Leisure Time Social Support Transportation Vocational/Educational  Cognition:  WNL  ADL's:  Intact  AIMS (if indicated):       VITALS  Blood pressure (!) 168/77, pulse 73,  temperature 98.4 F (36.9 C), temperature source Oral, resp. rate 15, height 5' 6 (1.676 m), weight 84.5 kg, SpO2 100%.  LABS  Admission on 10/08/2024  Component Date Value Ref Range Status   Sodium 10/08/2024 143  135 - 145 mmol/L Final   Potassium 10/08/2024 4.4  3.5 - 5.1 mmol/L Final   Chloride 10/08/2024 107  98 - 111 mmol/L Final   CO2 10/08/2024 24  22 - 32 mmol/L Final   Glucose, Bld 10/08/2024 145 (H)  70 - 99 mg/dL Final   Glucose reference range applies only to samples taken after fasting for at least 8 hours.   BUN 10/08/2024 29 (H)  8 - 23 mg/dL Final   Creatinine, Ser 10/08/2024 1.01 (H)  0.44 - 1.00 mg/dL Final   Calcium  10/08/2024 9.0  8.9 - 10.3 mg/dL Final   Total Protein 89/69/7974 7.1  6.5 - 8.1 g/dL Final   Albumin 89/69/7974 4.0  3.5 - 5.0 g/dL Final   AST 89/69/7974 22  15 - 41 U/L Final   ALT 10/08/2024 17  0 - 44 U/L Final   Alkaline Phosphatase 10/08/2024 64  38 - 126 U/L Final   Total Bilirubin 10/08/2024 0.8  0.0 - 1.2 mg/dL Final   GFR, Estimated 10/08/2024 56 (L)  >60 mL/min Final   Comment: (NOTE) Calculated using the CKD-EPI Creatinine Equation (2021)    Anion gap 10/08/2024 12  5 - 15 Final   Performed at [facility], [address] [place], [place] [address]   WBC 10/08/2024 7.7  4.0 - 10.5 K/uL Final   RBC 10/08/2024 4.29  3.87 - 5.11 MIL/uL Final   Hemoglobin 10/08/2024 13.0  12.0 - 15.0 g/dL Final   HCT 89/69/7974 40.6  36.0 - 46.0 % Final   MCV 10/08/2024 94.6  80.0 - 100.0 fL Final   MCH 10/08/2024 30.3  26.0 - 34.0 pg Final   MCHC 10/08/2024 32.0  30.0 - 36.0 g/dL Final   RDW 89/69/7974 13.3  11.5 - 15.5 % Final   Platelets 10/08/2024 257  150 - 400 K/uL Final   nRBC 10/08/2024 0.0  0.0 - 0.2 % Final   Neutrophils Relative % 10/08/2024 57  % Final   Neutro Abs 10/08/2024 4.5  1.7 - 7.7 K/uL Final   Lymphocytes Relative 10/08/2024 29  % Final   Lymphs Abs 10/08/2024 2.3  0.7 - 4.0 K/uL Final   Monocytes Relative 10/08/2024 11   % Final   Monocytes Absolute 10/08/2024 0.8  0.1 - 1.0 K/uL Final   Eosinophils Relative 10/08/2024 2  %  Final   Eosinophils Absolute 10/08/2024 0.1  0.0 - 0.5 K/uL Final   Basophils Relative 10/08/2024 1  % Final   Basophils Absolute 10/08/2024 0.1  0.0 - 0.1 K/uL Final   Immature Granulocytes 10/08/2024 0  % Final   Abs Immature Granulocytes 10/08/2024 0.02  0.00 - 0.07 K/uL Final   Performed at [facility], [address] [place], [place] [address]   Troponin I (High Sensitivity) 10/08/2024 4  <18 ng/L Final   Comment: (NOTE) Elevated high sensitivity troponin I (hsTnI) values and significant  changes across serial measurements may suggest ACS but many other  chronic and acute conditions are known to elevate hsTnI results.  Refer to the Links section for chest pain algorithms and additional  guidance. Performed at [facility], [address] [place], [place] [address]    Magnesium  10/08/2024 2.3  1.7 - 2.4 mg/dL Final   Performed at [facility], [address] [place], [place] [address]   TSH 10/08/2024 4.918 (H)  0.350 - 4.500 uIU/mL Final   Comment: Performed by a 3rd Generation assay with a functional sensitivity of <=0.01 uIU/mL. Performed at [facility], [address] [place], [place] [address]    Free T4 10/08/2024 0.94  0.61 - 1.12 ng/dL Final   Comment: (NOTE) Biotin ingestion may interfere with free T4 tests. If the results are inconsistent with the TSH level, previous test results, or the clinical presentation, then consider biotin interference. If needed, order repeat testing after stopping biotin. Performed at [facility], [address] [place], [place] [address]    Specimen Source 10/08/2024 URINE, CLEAN CATCH   Final   Color, Urine 10/08/2024 YELLOW (A)  YELLOW Final   APPearance 10/08/2024 HAZY (A)  CLEAR Final   Specific Gravity, Urine 10/08/2024 1.023  1.005 - 1.030 Final   pH 10/08/2024 5.0  5.0  - 8.0 Final   Glucose, UA 10/08/2024 NEGATIVE  NEGATIVE mg/dL Final   Hgb urine dipstick 10/08/2024 NEGATIVE  NEGATIVE Final   Bilirubin Urine 10/08/2024 NEGATIVE  NEGATIVE Final   Ketones, ur 10/08/2024 5 (A)  NEGATIVE mg/dL Final   Protein, ur 89/69/7974 NEGATIVE  NEGATIVE mg/dL Final   Nitrite 89/69/7974 NEGATIVE  NEGATIVE Final   Leukocytes,Ua 10/08/2024 NEGATIVE  NEGATIVE Final   RBC / HPF 10/08/2024 0-5  0 - 5 RBC/hpf Final   WBC, UA 10/08/2024 0  0 - 5 WBC/hpf Final   Comment:        Reflex urine culture not performed if WBC <=10, OR if Squamous epithelial cells >5. If Squamous epithelial cells >5 suggest recollection.    Bacteria, UA 10/08/2024 NONE SEEN  NONE SEEN Final   Squamous Epithelial / HPF 10/08/2024 0-5  0 - 5 /HPF Final   Mucus 10/08/2024 PRESENT   Final   Performed at [facility], [address] [place], [place] [address]    PSYCHIATRIC REVIEW OF SYSTEMS (ROS)  Depression:      [x]  Denies all symptoms of depression [] Depressed mood       [] Insomnia/hypersomnia              [] Fatigue        [] Change in appetite     [] Anhedonia                                [] Difficulty concentrating      [] Hopelessness             []   Worthlessness [] Guilt/shame                [] Psychomotor agitation/retardation   Mania:     [x] Denies all symptoms of mania [] Elevated mood           [] Irritability         [] Pressured speech         []  Grandiosity         []  Decreased need for sleep                                                 [] Increased energy          []  Increase in goal directed activity                                       [] Flight of ideas    []  Excessive involvement in high-risk behaviors                   []  Distractibility     Psychosis:     [] Denies all symptoms of psychosis [] Paranoia         []  Auditory Hallucinations          [] Visual hallucinations         [] ELOC        [] IOR                [x] Delusions   Suicide:    [x]  Denies SI/plan/intent []   Passive SI         []   Active SI         [] Plan           [] Intent   Homicide:  [x]   Denies HI/plan/intent []  Passive HI         []  Active HI         [] Plan            [] Intent           [] Identified Target    Additional findings:      Musculoskeletal: No abnormal movements observed      Gait & Station: Laying/Sitting      Pain Screening: Present - mild to moderate      Nutrition & Dental Concerns: none reported; stated she does like to exercise, trying to lose wt  RISK FORMULATION/ASSESSMENT  [place]-Suicide Severity Rating Scale (C-SSRS)  1) Have you wished you were dead or wished you could go to sleep and not wake up? no 2) Have you actually had any thoughts about killing yourself? no   Is the patient experiencing any suicidal or homicidal ideations:         [x] NO        Protective factors considered for safety management:   Absence of psychosis Access to adequate health care Advice& help seeking Resourcefulness/Survival skills Sense of responsibility Positive coping skills Positive social support Positive therapeutic relationship Future oriented Suicide Inquiry:  Denies suicidal ideations, intentions, or plans.  Denies recent self-harm behavior. Talks futuristically.  Risk factors/concerns considered for safety management:  [] Prior attempt                                      []   Hopelessness  [x] Family history of suicide                    [] Impulsivity [] Depression                                         [] Aggression [] Substance abuse/dependence          [] Isolation [x] Physical illness/chronic pain              [] Barriers to accessing treatment [] Recent loss                                        [] Unwillingness to seek help [x] Access to lethal means                      [] Female gender [x] Age over 91                                        [x] Unmarried   Is there a safety management plan with the patient and treatment team to minimize risk factors and promote protective  factors:     [x] YES          []  NO            Explain: fall risk     Is crisis care placement or psychiatric hospitalization recommended:  [] YES    [x] NO  Based on my current evaluation and risk assessment, patient is determined at this time to be at:Low risk     *RISK ASSESSMENT Risk assessment is a dynamic process; it is possible that this patient's condition, and risk level, may change. This should be re-evaluated and managed over time as appropriate. Please re-consult psychiatric consult services if additional assistance is needed in terms of risk assessment and management. If your team decides to discharge this patient, please advise the patient how to best access emergency psychiatric services, or to call 911, if their condition worsens or they feel unsafe in any way.    I personally spent a total of 60 minutes in the care of the patient today including preparing to see the patient, getting/reviewing separately obtained history, performing a medically appropriate exam/evaluation, counseling and educating, referring and communicating with other health care professionals, documenting clinical information in the EHR, and coordinating care.    Dr. Sherril JUDITHANN Ada, PhD, MSN, APRN, PMHNP-BC, [facility]  KANDICE Ada, NP Telepsychiatry Consult Services

## 2024-10-09 NOTE — ED Provider Notes (Signed)
-----------------------------------------   10:51 AM on 10/09/2024 ----------------------------------------- Psychiatry has seen and evaluated.  They believe the patient is safe for discharge home.  I do recommend changing Risperdal  from as needed to daily.  I will prescribe this medication for the patient.  Patient will be discharged shortly.   Dorothyann Drivers, MD 10/09/24 1052

## 2024-10-09 NOTE — ED Notes (Signed)
 Notified [facility] of plan for discharge, states unable to provide transfer

## 2024-10-09 NOTE — Progress Notes (Addendum)
 After speaking with patient's RN Darold), [facility] contacted [facility] [phone]) and spoke to [facility]).  Lucie stated because it's an independent living facility they would not be able to pick up pt.  Samantha provided # to pt's niece Lasasha Brophy [phone]).  [facility] spoke to [place], however she is out of town but gave # to another niece Clemmie Buelna [phone]).  [facility] contacted Randine and left a HIPAA compliant voicemail requesting a return call.    Ronal also stated that patient's brother Lillyth Spong - listed as contact) lives in [place] and is unable to drive.  Rudell RN stated she reached out to patient's friend (Diane Bell - listed as contact) but she is sick and also is the primary caregiver of her husband.  [facility] will update once receives call back from [facility].  12:25PM:  [facility] did not receive call back from [facility].  Patient's RN updated [facility] that [facility] will be able to transport pt home.  [facility] updated pt's niece Alston [place]uk).     [place], [facility] [phone]

## 2024-10-21 ENCOUNTER — Telehealth: Payer: Self-pay

## 2024-10-21 NOTE — Telephone Encounter (Signed)
 Copied from CRM [id number]. Topic: General - Other >> Oct 21, 2024  3:18 PM Paige D wrote: Reason for CRM: Nia brown social wokrer with [employer] adult protective service, she is doing an investigation and needs to speak to someone clinical in regards to pt.  she is asking if there is any cognitive concerns, med compliance, appointment compliance. She would like a call back to speak to someone on the clinical side.   Best call back # for miss Nia [phone]

## 2024-10-22 NOTE — Telephone Encounter (Signed)
 Called pt  and pt  confirmed she has and Appt in in [place] and plans on coming. Called Nia with [employer] back and relayed provider msg

## 2024-10-22 NOTE — Telephone Encounter (Signed)
 Vm left to CB   E2C2 IT IS OKAY TO RELAY MSG OR TRANSFER CALL TO OFFICE

## 2024-11-02 ENCOUNTER — Encounter: Payer: Self-pay | Admitting: Dermatology

## 2024-11-02 ENCOUNTER — Ambulatory Visit: Admitting: Dermatology

## 2024-11-02 DIAGNOSIS — Z85828 Personal history of other malignant neoplasm of skin: Secondary | ICD-10-CM | POA: Diagnosis not present

## 2024-11-02 DIAGNOSIS — L219 Seborrheic dermatitis, unspecified: Secondary | ICD-10-CM

## 2024-11-02 DIAGNOSIS — W908XXA Exposure to other nonionizing radiation, initial encounter: Secondary | ICD-10-CM | POA: Diagnosis not present

## 2024-11-02 DIAGNOSIS — D1801 Hemangioma of skin and subcutaneous tissue: Secondary | ICD-10-CM | POA: Diagnosis not present

## 2024-11-02 DIAGNOSIS — L821 Other seborrheic keratosis: Secondary | ICD-10-CM

## 2024-11-02 DIAGNOSIS — Z1283 Encounter for screening for malignant neoplasm of skin: Secondary | ICD-10-CM

## 2024-11-02 DIAGNOSIS — L578 Other skin changes due to chronic exposure to nonionizing radiation: Secondary | ICD-10-CM

## 2024-11-02 DIAGNOSIS — I781 Nevus, non-neoplastic: Secondary | ICD-10-CM

## 2024-11-02 DIAGNOSIS — L814 Other melanin hyperpigmentation: Secondary | ICD-10-CM

## 2024-11-02 DIAGNOSIS — D229 Melanocytic nevi, unspecified: Secondary | ICD-10-CM

## 2024-11-02 NOTE — Progress Notes (Signed)
 Follow-Up Visit   Subjective  Pamela Norman is a 83 y.o. female who presents for the following: Skin Cancer Screening and Full Body Skin Exam. HxBCC, HxSCC.  The patient presents for Total-Body Skin Exam (TBSE) for skin cancer screening and mole check. The patient has spots, moles and lesions to be evaluated, some may be new or changing and the patient may have concern these could be cancer.    The following portions of the chart were reviewed this encounter and updated as appropriate: medications, allergies, medical history  Review of Systems:  No other skin or systemic complaints except as noted in HPI or Assessment and Plan.  Objective  Well appearing patient in no apparent distress; mood and affect are within normal limits.  A full examination was performed including scalp, head, eyes, ears, nose, lips, neck, chest, axillae, abdomen, back, buttocks, bilateral upper extremities, bilateral lower extremities, hands, feet, fingers, toes, fingernails, and toenails. All findings within normal limits unless otherwise noted below.   Relevant physical exam findings are noted in the Assessment and Plan.    Assessment & Plan   SKIN CANCER SCREENING PERFORMED TODAY.  HISTORY OF BASAL CELL CARCINOMA OF THE SKIN. Left inframammary. Excised 04/22/2024. - No evidence of recurrence today - Recommend regular full body skin exams - Recommend daily broad spectrum sunscreen SPF 30+ to sun-exposed areas, reapply every 2 hours as needed.  - Call if any new or changing lesions are noted between office visits  HISTORY OF SQUAMOUS CELL CARCINOMA OF THE SKIN. Face.  - No evidence of recurrence today - No lymphadenopathy - Recommend regular full body skin exams - Recommend daily broad spectrum sunscreen SPF 30+ to sun-exposed areas, reapply every 2 hours as needed.  - Call if any new or changing lesions are noted between office visits  ACTINIC DAMAGE - Chronic condition, secondary to cumulative  UV/sun exposure - diffuse scaly erythematous macules with underlying dyspigmentation - Recommend daily broad spectrum sunscreen SPF 30+ to sun-exposed areas, reapply every 2 hours as needed.  - Staying in the shade or wearing long sleeves, sun glasses (UVA+UVB protection) and wide brim hats (4-inch brim around the entire circumference of the hat) are also recommended for sun protection.  - Call for new or changing lesions.  LENTIGINES, SEBORRHEIC KERATOSES, HEMANGIOMAS - Benign normal skin lesions - Benign-appearing - Call for any changes  MELANOCYTIC NEVI - Tan-brown and/or pink-flesh-colored symmetric macules and papules - Benign appearing on exam today - Observation - Call clinic for new or changing moles - Recommend daily use of broad spectrum spf 30+ sunscreen to sun-exposed areas.    Varicose Veins/Spider Veins - Dilated blue, purple or red veins at the lower extremities - Reassured - Smaller vessels can be treated by sclerotherapy (a procedure to inject a medicine into the veins to make them disappear) if desired, but the treatment is not covered by insurance. Larger vessels may be covered if symptomatic and we would refer to vascular surgeon if treatment desired.  SEBORRHEIC DERMATITIS Exam: Pink patches with greasy scale at scalp, nasolabial folds, alar creases and chin  Chronic and persistent condition with duration or expected duration over one year. Condition is bothersome/symptomatic for patient. Currently flared.   Seborrheic Dermatitis is a chronic persistent rash characterized by pinkness and scaling most commonly of the mid face but also can occur on the scalp (dandruff), ears; mid chest, mid back and groin.  It tends to be exacerbated by stress and cooler weather.  People who  have neurologic disease may experience new onset or exacerbation of existing seborrheic dermatitis.  The condition is not curable but treatable and can be controlled.  Treatment Plan: Patient  deferred treatment at this time.     MULTIPLE BENIGN NEVI   ACTINIC ELASTOSIS   SEBORRHEIC KERATOSES   LENTIGINES   CHERRY ANGIOMA   SPIDER VEINS   SEBORRHEIC DERMATITIS   Return in about 1 year (around 11/02/2025) for TBSE, HxBCC; patient prefers 1 year.  I, Jill Parcell, CMA, am acting as scribe for Boneta Sharps, MD.   Documentation: I have reviewed the above documentation for accuracy and completeness, and I agree with the above.  Boneta Sharps, MD

## 2024-11-02 NOTE — Patient Instructions (Signed)

## 2024-11-25 ENCOUNTER — Ambulatory Visit: Admitting: Nurse Practitioner

## 2024-11-26 ENCOUNTER — Ambulatory Visit: Admitting: Nurse Practitioner

## 2024-11-26 VITALS — BP 138/80 | HR 81 | Temp 98.0°F | Ht 66.0 in | Wt 180.2 lb

## 2024-11-26 DIAGNOSIS — Z8659 Personal history of other mental and behavioral disorders: Secondary | ICD-10-CM

## 2024-11-26 DIAGNOSIS — M8580 Other specified disorders of bone density and structure, unspecified site: Secondary | ICD-10-CM | POA: Diagnosis not present

## 2024-11-26 DIAGNOSIS — E785 Hyperlipidemia, unspecified: Secondary | ICD-10-CM | POA: Diagnosis not present

## 2024-11-26 DIAGNOSIS — E039 Hypothyroidism, unspecified: Secondary | ICD-10-CM

## 2024-11-26 DIAGNOSIS — E119 Type 2 diabetes mellitus without complications: Secondary | ICD-10-CM

## 2024-11-26 DIAGNOSIS — Z1231 Encounter for screening mammogram for malignant neoplasm of breast: Secondary | ICD-10-CM | POA: Diagnosis not present

## 2024-11-26 DIAGNOSIS — Z7984 Long term (current) use of oral hypoglycemic drugs: Secondary | ICD-10-CM

## 2024-11-26 DIAGNOSIS — E559 Vitamin D deficiency, unspecified: Secondary | ICD-10-CM | POA: Diagnosis not present

## 2024-11-26 MED ORDER — METFORMIN HCL 500 MG PO TABS: ORAL_TABLET | ORAL | 3 refills | Status: AC

## 2024-11-26 MED ORDER — DULOXETINE HCL 30 MG PO CPEP: 30.0000 mg | ORAL_CAPSULE | Freq: Every day | ORAL | 3 refills | Status: AC

## 2024-11-26 MED ORDER — ATORVASTATIN CALCIUM 80 MG PO TABS: 80.0000 mg | ORAL_TABLET | Freq: Every day | ORAL | 3 refills | Status: AC

## 2024-11-26 NOTE — Progress Notes (Signed)
 " Pamela Glance, NP-C Phone: [phone]  Pamela Norman is a 83 y.o. female who presents today for follow up.   Discussed the use of AI scribe software for clinical note transcription with the patient, who gave verbal consent to proceed.  History of Present Illness   Pamela Norman is an 83 year old female who presents for routine follow-up and referral for a mammogram.  She has recently received both the COVID and flu vaccines. She is seeking a referral to the breast center for a mammogram, motivated by a friend her age who undergoes annual screenings.  She is currently taking Lipitor for hyperlipidemia and metformin  for type 2 diabetes. She takes metformin  once daily but does not regularly check her blood sugar, although she has the equipment to do so. She is concerned that her blood sugar might be high due to her diet, particularly her consumption of desserts at [facility]. No symptoms of excessive thirst, excessive urination, or hypoglycemic episodes such as shakiness.  She is also taking risperidone  at bedtime and Cymbalta  (duloxetine ) for mood and muscle relaxation. These medications help with her mood and muscle pain.  She mentions a recent move from [facility] to [place], which is a short distance away. She has family in [place] and a supportive neighbor, Odetta, who can assist her with the move. She sees her family during the holidays, such as Thanksgiving and Christmas.  In October, she visited the emergency room twice due to 'body issues' and a urinary tract infection (UTI), which was treated.      Tobacco Use History[1]  Medications Ordered Prior to Encounter[2]   ROS see history of present illness  Objective  Physical Exam Vitals:   11/26/24 1449  BP: 138/80  Pulse: 81  Temp: 98 F (36.7 C)  SpO2: 95%    BP Readings from Last 3 Encounters:  11/26/24 138/80  10/09/24 (!) 167/94  10/02/24 (!) 124/58   Wt  Readings from Last 3 Encounters:  11/26/24 180 lb 3.2 oz (81.7 kg)  10/08/24 186 lb 4.8 oz (84.5 kg)  10/02/24 178 lb (80.7 kg)    Physical Exam Constitutional:      General: She is not in acute distress.    Appearance: Normal appearance.  HENT:     Head: Normocephalic.  Cardiovascular:     Rate and Rhythm: Normal rate and regular rhythm.     Heart sounds: Normal heart sounds.  Pulmonary:     Effort: Pulmonary effort is normal.     Breath sounds: Normal breath sounds.  Skin:    General: Skin is warm and dry.  Neurological:     General: No focal deficit present.     Mental Status: She is alert.  Psychiatric:        Mood and Affect: Mood normal.        Behavior: Behavior normal.      Assessment/Plan: Please see individual problem list.  Diabetes mellitus without complication (HCC) Assessment & Plan: Chronic. Controlled. Last A1c at 6.8. She is on metformin  without symptoms of hyperglycemia or hypoglycemia. Continue metformin  once daily. Check A1c. Encourage healthy diet and regular exercise.   Orders: -     metFORMIN  HCl; TAKE 1 TABLET(500 MG) BY MOUTH DAILY WITH BREAKFAST  Dispense: 90 tablet; Refill: 3 -     Comprehensive metabolic panel with GFR -     Hemoglobin A1c  History of psychosis Assessment & Plan: Managed with duloxetine  and  risperidone  as needed, which aids in muscle relaxation, pain management and sleep. No current psychiatric symptoms. Continue duloxetine  30 mg daily. Continue risperidone  as needed for sleep.  Orders: -     DULoxetine  HCl; Take 1 capsule (30 mg total) by mouth daily.  Dispense: 90 capsule; Refill: 3 -     risperiDONE ; Take 1 tablet (1 mg total) by mouth at bedtime.  Dispense: 90 tablet; Refill: 3  Hyperlipidemia, unspecified hyperlipidemia type Assessment & Plan: Chronic. Managed with atorvastatin , which is well-tolerated without new symptoms. Continue atorvastatin  80 mg daily.   Orders: -     Atorvastatin  Calcium ; Take 1 tablet (80  mg total) by mouth daily.  Dispense: 90 tablet; Refill: 3 -     Comprehensive metabolic panel with GFR  Hypothyroidism, unspecified type Assessment & Plan: Check TSH. Not on any medications. Asymptomatic. Consider initiating levothyroxine if remaining elevated.   Orders: -     TSH  Osteopenia, unspecified location Assessment & Plan: Managed with daily OTC vitamin D  supplement. Continue supplementation. Check vitamin D  level.   Orders: -     VITAMIN D  25 Hydroxy (Vit-D Deficiency, Fractures)  Vitamin D  deficiency -     VITAMIN D  25 Hydroxy (Vit-D Deficiency, Fractures)  Screening mammogram for breast cancer -     3D Screening Mammogram, Left and Right; Future     Return in about 6 months (around 05/27/2025) for Follow up.   Pamela Glance, NP-C [facility] [place]     [1]  Social History Tobacco Use  Smoking Status Former   Current packs/day: 0.00   Average packs/day: 0.3 packs/day for 20.0 years (5.0 ttl pk-yrs)   Types: Cigarettes   Start date: 04/13/1961   Quit date: 04/13/1981   Years since quitting: 43.6  Smokeless Tobacco Never  [2]  Current Outpatient Medications on File Prior to Visit  Medication Sig Dispense Refill   cholecalciferol (VITAMIN D ) 1000 units tablet Take 1,000 Units by mouth daily.     Multiple Vitamin (MULTIVITAMIN) tablet Take 1 tablet by mouth daily.     No current facility-administered medications on file prior to visit.   "

## 2024-11-27 LAB — COMPREHENSIVE METABOLIC PANEL WITH GFR
ALT: 11 U/L (ref 3–35)
AST: 13 U/L (ref 5–37)
Albumin: 4.5 g/dL (ref 3.5–5.2)
Alkaline Phosphatase: 72 U/L (ref 39–117)
BUN: 24 mg/dL — ABNORMAL HIGH (ref 6–23)
CO2: 28 meq/L (ref 19–32)
Calcium: 9.6 mg/dL (ref 8.4–10.5)
Chloride: 105 meq/L (ref 96–112)
Creatinine, Ser: 0.83 mg/dL (ref 0.40–1.20)
GFR: 65.47 mL/min
Glucose, Bld: 162 mg/dL — ABNORMAL HIGH (ref 70–99)
Potassium: 4.3 meq/L (ref 3.5–5.1)
Sodium: 141 meq/L (ref 135–145)
Total Bilirubin: 0.4 mg/dL (ref 0.2–1.2)
Total Protein: 7 g/dL (ref 6.0–8.3)

## 2024-11-27 LAB — VITAMIN D 25 HYDROXY (VIT D DEFICIENCY, FRACTURES): VITD: 36.88 ng/mL (ref 30.00–100.00)

## 2024-11-27 LAB — TSH: TSH: 4.5 u[IU]/mL (ref 0.35–5.50)

## 2024-11-27 LAB — HEMOGLOBIN A1C: Hgb A1c MFr Bld: 7.1 % — ABNORMAL HIGH (ref 4.6–6.5)

## 2024-12-01 ENCOUNTER — Ambulatory Visit: Payer: Self-pay | Admitting: Nurse Practitioner

## 2024-12-07 ENCOUNTER — Encounter: Payer: Self-pay | Admitting: Nurse Practitioner

## 2024-12-07 NOTE — Assessment & Plan Note (Addendum)
 Check TSH. Not on any medications. Asymptomatic. Consider initiating levothyroxine if remaining elevated.

## 2024-12-07 NOTE — Assessment & Plan Note (Signed)
 Managed with duloxetine  and risperidone  as needed, which aids in muscle relaxation, pain management and sleep. No current psychiatric symptoms. Continue duloxetine  30 mg daily. Continue risperidone  as needed for sleep.

## 2024-12-07 NOTE — Assessment & Plan Note (Signed)
 Managed with daily OTC vitamin D  supplement. Continue supplementation. Check vitamin D  level.

## 2024-12-07 NOTE — Assessment & Plan Note (Addendum)
 Chronic. Controlled. Last A1c at 6.8. She is on metformin  without symptoms of hyperglycemia or hypoglycemia. Continue metformin  once daily. Check A1c. Encourage healthy diet and regular exercise.

## 2024-12-07 NOTE — Assessment & Plan Note (Signed)
 Chronic. Managed with atorvastatin , which is well-tolerated without new symptoms. Continue atorvastatin  80 mg daily.

## 2025-01-15 ENCOUNTER — Emergency Department

## 2025-01-15 ENCOUNTER — Other Ambulatory Visit: Payer: Self-pay

## 2025-01-15 ENCOUNTER — Emergency Department
Admission: EM | Admit: 2025-01-15 | Source: Home / Self Care | Attending: Emergency Medicine | Admitting: Emergency Medicine

## 2025-01-15 DIAGNOSIS — W19XXXA Unspecified fall, initial encounter: Secondary | ICD-10-CM

## 2025-01-15 DIAGNOSIS — S42292A Other displaced fracture of upper end of left humerus, initial encounter for closed fracture: Secondary | ICD-10-CM

## 2025-01-15 LAB — CBC WITH DIFFERENTIAL/PLATELET
Abs Immature Granulocytes: 0.06 10*3/uL (ref 0.00–0.07)
Basophils Absolute: 0 10*3/uL (ref 0.0–0.1)
Basophils Relative: 0 %
Eosinophils Absolute: 0 10*3/uL (ref 0.0–0.5)
Eosinophils Relative: 0 %
HCT: 37.8 % (ref 36.0–46.0)
Hemoglobin: 12.8 g/dL (ref 12.0–15.0)
Immature Granulocytes: 1 %
Lymphocytes Relative: 9 %
Lymphs Abs: 1.1 10*3/uL (ref 0.7–4.0)
MCH: 30.7 pg (ref 26.0–34.0)
MCHC: 33.9 g/dL (ref 30.0–36.0)
MCV: 90.6 fL (ref 80.0–100.0)
Monocytes Absolute: 0.8 10*3/uL (ref 0.1–1.0)
Monocytes Relative: 7 %
Neutro Abs: 10.1 10*3/uL — ABNORMAL HIGH (ref 1.7–7.7)
Neutrophils Relative %: 83 %
Platelets: 244 10*3/uL (ref 150–400)
RBC: 4.17 MIL/uL (ref 3.87–5.11)
RDW: 13.7 % (ref 11.5–15.5)
WBC: 12.1 10*3/uL — ABNORMAL HIGH (ref 4.0–10.5)
nRBC: 0 % (ref 0.0–0.2)

## 2025-01-15 MED ORDER — TRAMADOL HCL 50 MG PO TABS: 50.0000 mg | ORAL_TABLET | Freq: Two times a day (BID) | ORAL | 0 refills | Status: AC | PRN

## 2025-01-15 MED ORDER — MORPHINE SULFATE (PF) 4 MG/ML IV SOLN
4.0000 mg | Freq: Once | INTRAVENOUS | Status: AC
  Filled 2025-01-15: qty 1

## 2025-01-15 NOTE — ED Triage Notes (Signed)
 First nurse note: pt to ED [facility] from [facility] for fall and trip over box, no blood thinners, no LOC. C/o left shoulder pain. VSS with EMS

## 2025-01-15 NOTE — ED Provider Notes (Signed)
 "  [facility] Provider Note    Event Date/Time   First MD Initiated Contact with Patient 01/15/25 2013     (approximate)   History   Fall   HPI  Pamela Norman is a 84 y.o. female with history of CVA, hyperlipidemia, endometrial cancer status post hysterectomy who presents with a left arm injury after mechanical fall from standing height.  The patient states that around lunchtime she tripped over a box causing her to fall onto the left side.  She did not hit her head or lose consciousness.  She denies any neck or back pain.  She reports pain primarily to the left shoulder.  She had some pain to the left leg earlier that has resolved.  Reviewed the past medical records.  The patient's most recent outpatient encounter in our system was with primary care on 12/18 for follow-up of her chronic conditions.   Physical Exam   Triage Vital Signs: ED Triage Vitals  Encounter Vitals Group     BP 01/15/25 1722 (!) 169/155     Girls Systolic BP Percentile --      Girls Diastolic BP Percentile --      Boys Systolic BP Percentile --      Boys Diastolic BP Percentile --      Pulse Rate 01/15/25 1722 87     Resp 01/15/25 1722 17     Temp 01/15/25 1722 98.4 F (36.9 C)     Temp src --      SpO2 01/15/25 1722 98 %     Weight 01/15/25 1723 181 lb (82.1 kg)     Height 01/15/25 1723 5' 6 (1.676 m)     Head Circumference --      Peak Flow --      Pain Score 01/15/25 1723 7     Pain Loc --      Pain Education --      Exclude from Growth Chart --     Most recent vital signs: Vitals:   01/15/25 2030 01/15/25 2045  BP: (!) 173/85 (!) 172/85  Pulse: 90 93  Resp:    Temp:    SpO2: 100% 100%     General: Alert and oriented, no distress.  CV:  Good peripheral perfusion.  Resp:  Normal effort.  Abd:  No distention.  Other:  Tenderness to the proximal humerus but no significant deformity.  Elbow nontender.  2+ radial pulse.  Motor and sensory intact in median,  radial, and ulnar distributions.  Full range of motion with no significant tenderness to the left hip, knee, and ankle.  No midline cervical spinal tenderness.  Motor intact in all extremities.   ED Results / Procedures / Treatments   Labs (all labs ordered are listed, but only abnormal results are displayed) Labs Reviewed  CBC WITH DIFFERENTIAL/PLATELET - Abnormal; Notable for the following components:      Result Value   WBC 12.1 (*)    Neutro Abs 10.1 (*)    All other components within normal limits  BASIC METABOLIC PANEL WITH GFR     EKG     RADIOLOGY  XR L shoulder: I independently viewed and interpreted the images; there is a proximal humerus fracture.  XR L tib/fib: No acute fracture  XR L femur: No acute fracture  PROCEDURES:  Critical Care performed: No  Procedures   MEDICATIONS ORDERED IN ED: Medications  morphine  (PF) 4 MG/ML injection 4 mg (has no administration in time  range)     IMPRESSION / MDM / ASSESSMENT AND PLAN / ED COURSE  I reviewed the triage vital signs and the nursing notes.  84 year old female with PMH as noted above presents with left shoulder pain after mechanical fall from standing height.  Neurologic exam is nonfocal.  There is no evidence of head injury or spinal trauma.    Differential diagnosis includes, but is not limited to, contusion, fracture, dislocation.  X-rays reveal a proximal humerus fracture.  X-rays of the left leg are negative.  I consulted Dr. Mariah from orthopedics who reviewed the images.  He recommends patient the patient in a sling and having her follow-up as an outpatient.  He also recommended an x-ray of the full humerus to rule out any distal injury.  Patient's presentation is most consistent with acute complicated illness / injury requiring diagnostic workup.  ----------------------------------------- 11:40 PM on 01/15/2025 -----------------------------------------  X-ray of the humerus shows no other acute  fractures.  I had plan to discharge the patient, however on reassessment she now appears confused and may be sundowning.  She had difficulty with placement of a sling.  She may need admission for pain management.  I have ordered pain medication, basic labs, and a CT of the head just in case she hit her head.  I have signed her out to the oncoming ED physician Dr. Gordan.   FINAL CLINICAL IMPRESSION(S) / ED DIAGNOSES   Final diagnoses:  Closed fracture of head of left humerus, initial encounter  Fall, initial encounter     Rx / DC Orders   ED Discharge Orders          Ordered    traMADol  (ULTRAM ) 50 MG tablet  Every 12 hours PRN        01/15/25 2140             Note:  This document was prepared using Dragon voice recognition software and may include unintentional dictation errors.    Jacolyn Pae, MD 01/15/25 2353  "

## 2025-01-15 NOTE — ED Provider Notes (Incomplete)
----------------------------------------- °  11:39 PM on 01/15/2025 -----------------------------------------  Blood pressure (!) 172/85, pulse 93, temperature 98.4 F (36.9 C), resp. rate 17, height 1.676 m (5' 6), weight 82.1 kg, SpO2 100%.   Assuming care from Dr. FERNAND  In short, Pamela Norman is a 84 y.o. female with a chief complaint of ***.  Refer to the original H&P for additional details.  The current plan of care is to ***.       Medications  morphine  (PF) 4 MG/ML injection 4 mg (has no administration in time range)     ED Discharge Orders          Ordered    traMADol  (ULTRAM ) 50 MG tablet  Every 12 hours PRN        01/15/25 2140           Final diagnoses:  Closed fracture of head of left humerus, initial encounter  Fall, initial encounter

## 2025-01-15 NOTE — ED Triage Notes (Signed)
 Pt comes in via [facility] from [facility] after a fall. Pt states that she tripped over a box and fell. Pt fell on her left side, and complains of left shoulder pain, and left leg pain. Pt denies hitting head, and denies LOC. Pt not on blood thinners. Pt complains of pain 7/10 at this time.

## 2025-01-15 NOTE — ED Provider Triage Note (Signed)
 Emergency Medicine Provider Triage Evaluation Note  Pamela Norman , a 84 y.o. female  was evaluated in triage.  Pt complains of left shoulder and left leg pain after mechanical, nonsyncopal fall earlier today.  She denies striking her head or loss of consciousness.  She is moving into a condo and tripped over a box.  She did not strike her head or  lose consciousness.  Physical Exam  There were no vitals taken for this visit. Gen:   Awake, no distress   Resp:  Normal effort  MSK:   Diffuse pain left shoulder and left leg. Other:    Medical Decision Making  Medically screening exam initiated at 5:20 PM.  Appropriate orders placed.  CARYLON TAMBURRO was informed that the remainder of the evaluation will be completed by another provider, this initial triage assessment does not replace that evaluation, and the importance of remaining in the ED until their evaluation is complete.  Imaging ordered.    Herlinda Kirk NOVAK, FNP 01/15/25 1724

## 2025-01-15 NOTE — ED Notes (Signed)
 This NT and another nt proceeded to clean pt up and change sheets. Pt became agitated and kept saying that her bodys not straight but hollered when we tried to help. Once done changing pt sheets and putting pad up under and brief on pt this nt tried to put a sling on left arm and pt got even more  agitated. Sling was not applied and nurse notified. Will try to apply once pt settles down.

## 2025-01-15 NOTE — Discharge Instructions (Addendum)
 You have a fracture to the ball end of your humerus bone.  Keep it in a sling at all times except when changing.  Follow-up with orthopedics next week.  We have prescribed tramadol  for pain.  Return to the ER for new, worsening, or persistent severe arm pain, weakness or numbness in the arm, recurrent falls, or any other new or worsening symptoms that concern you.

## 2025-06-01 ENCOUNTER — Ambulatory Visit: Admitting: Nurse Practitioner

## 2025-11-02 ENCOUNTER — Ambulatory Visit: Admitting: Dermatology
